# Patient Record
Sex: Male | Born: 1980 | Race: White | Hispanic: No | Marital: Married | Smoking: Former smoker
Health system: Southern US, Community
[De-identification: ages and names within clinical notes are randomized; demographics above are authoritative.]

## PROBLEM LIST (undated history)

## (undated) DIAGNOSIS — Z8739 Personal history of other diseases of the musculoskeletal system and connective tissue: Secondary | ICD-10-CM

## (undated) DIAGNOSIS — G4700 Insomnia, unspecified: Secondary | ICD-10-CM

## (undated) DIAGNOSIS — R569 Unspecified convulsions: Secondary | ICD-10-CM

## (undated) DIAGNOSIS — E669 Obesity, unspecified: Secondary | ICD-10-CM

## (undated) DIAGNOSIS — K219 Gastro-esophageal reflux disease without esophagitis: Secondary | ICD-10-CM

## (undated) DIAGNOSIS — E079 Disorder of thyroid, unspecified: Secondary | ICD-10-CM

## (undated) HISTORY — DX: Gastro-esophageal reflux disease without esophagitis: K21.9

## (undated) HISTORY — DX: Unspecified convulsions: R56.9

## (undated) HISTORY — DX: Personal history of other diseases of the musculoskeletal system and connective tissue: Z87.39

## (undated) HISTORY — DX: Disorder of thyroid, unspecified: E07.9

## (undated) HISTORY — DX: Obesity, unspecified: E66.9

## (undated) HISTORY — DX: Insomnia, unspecified: G47.00

## (undated) HISTORY — PX: SHOULDER SURGERY: SHX246

---

## 2007-03-18 DIAGNOSIS — R569 Unspecified convulsions: Secondary | ICD-10-CM

## 2007-03-18 HISTORY — DX: Unspecified convulsions: R56.9

## 2007-08-01 ENCOUNTER — Ambulatory Visit: Payer: Self-pay | Admitting: Family Medicine

## 2007-08-01 DIAGNOSIS — K219 Gastro-esophageal reflux disease without esophagitis: Secondary | ICD-10-CM

## 2007-08-01 DIAGNOSIS — R569 Unspecified convulsions: Secondary | ICD-10-CM | POA: Insufficient documentation

## 2007-08-01 DIAGNOSIS — E039 Hypothyroidism, unspecified: Secondary | ICD-10-CM | POA: Insufficient documentation

## 2007-08-01 DIAGNOSIS — F5101 Primary insomnia: Secondary | ICD-10-CM | POA: Insufficient documentation

## 2007-08-02 LAB — CONVERTED CEMR LAB: TSH: 7.4 microintl units/mL — ABNORMAL HIGH (ref 0.350–5.50)

## 2007-08-10 ENCOUNTER — Encounter: Payer: Self-pay | Admitting: Family Medicine

## 2007-08-14 ENCOUNTER — Encounter: Payer: Self-pay | Admitting: Family Medicine

## 2007-08-30 ENCOUNTER — Ambulatory Visit: Payer: Self-pay | Admitting: Family Medicine

## 2007-09-13 ENCOUNTER — Encounter: Payer: Self-pay | Admitting: Family Medicine

## 2007-11-29 ENCOUNTER — Ambulatory Visit: Payer: Self-pay | Admitting: Family Medicine

## 2007-11-29 DIAGNOSIS — M79609 Pain in unspecified limb: Secondary | ICD-10-CM | POA: Insufficient documentation

## 2007-11-30 LAB — CONVERTED CEMR LAB
ALT: 19 units/L (ref 0–53)
Albumin: 4.7 g/dL (ref 3.5–5.2)
CO2: 21 meq/L (ref 19–32)
Calcium: 9.1 mg/dL (ref 8.4–10.5)
Chloride: 109 meq/L (ref 96–112)
Creatinine, Ser: 1.43 mg/dL (ref 0.40–1.50)
Potassium: 4.4 meq/L (ref 3.5–5.3)
TSH: 1.998 microintl units/mL (ref 0.350–4.50)
Total Protein: 7.2 g/dL (ref 6.0–8.3)

## 2008-03-14 ENCOUNTER — Encounter: Payer: Self-pay | Admitting: Family Medicine

## 2008-04-24 ENCOUNTER — Ambulatory Visit: Payer: Self-pay | Admitting: Family Medicine

## 2008-04-29 ENCOUNTER — Ambulatory Visit: Payer: Self-pay | Admitting: Family Medicine

## 2008-05-12 ENCOUNTER — Telehealth: Payer: Self-pay | Admitting: Family Medicine

## 2008-05-13 ENCOUNTER — Encounter: Payer: Self-pay | Admitting: Family Medicine

## 2008-05-14 LAB — CONVERTED CEMR LAB
ALT: 14 units/L (ref 0–53)
AST: 15 units/L (ref 0–37)
Alkaline Phosphatase: 83 units/L (ref 39–117)
Chloride: 107 meq/L (ref 96–112)
Creatinine, Ser: 1.1 mg/dL (ref 0.40–1.50)
HDL: 38 mg/dL — ABNORMAL LOW (ref >39)
TSH: 4.007 microintl units/mL (ref 0.350–4.500)
Total Bilirubin: 0.5 mg/dL (ref 0.3–1.2)
Total CHOL/HDL Ratio: 4.5
VLDL: 19 mg/dL (ref 0–40)

## 2008-06-13 ENCOUNTER — Telehealth: Payer: Self-pay | Admitting: Family Medicine

## 2008-08-25 ENCOUNTER — Encounter: Payer: Self-pay | Admitting: Family Medicine

## 2008-12-19 ENCOUNTER — Ambulatory Visit: Payer: Self-pay | Admitting: Family Medicine

## 2009-06-15 ENCOUNTER — Ambulatory Visit: Payer: Self-pay | Admitting: Family Medicine

## 2009-06-16 ENCOUNTER — Telehealth: Payer: Self-pay | Admitting: Family Medicine

## 2009-06-16 ENCOUNTER — Encounter: Payer: Self-pay | Admitting: Family Medicine

## 2009-06-16 LAB — CONVERTED CEMR LAB
ALT: 19 units/L (ref 0–53)
AST: 17 units/L (ref 0–37)
CO2: 21 meq/L (ref 19–32)
Calcium: 9.6 mg/dL (ref 8.4–10.5)
Chloride: 106 meq/L (ref 96–112)
Platelets: 246 10*3/uL (ref 150–400)
RDW: 13.5 % (ref 11.5–15.5)
Sodium: 140 meq/L (ref 135–145)
TSH: 3.789 microintl units/mL (ref 0.350–4.500)
Total Bilirubin: 0.3 mg/dL (ref 0.3–1.2)
Total Protein: 7.2 g/dL (ref 6.0–8.3)
WBC: 7.8 10*3/uL (ref 4.0–10.5)

## 2009-08-21 ENCOUNTER — Encounter: Payer: Self-pay | Admitting: Family Medicine

## 2009-09-14 ENCOUNTER — Ambulatory Visit: Payer: Self-pay | Admitting: Family Medicine

## 2009-11-02 ENCOUNTER — Encounter: Payer: Self-pay | Admitting: Family Medicine

## 2010-03-16 NOTE — Assessment & Plan Note (Signed)
Summary: CPE   Vital Signs:  Patient profile:   30 year old male Height:      71.25 inches Weight:      267 pounds BMI:     37.11 O2 Sat:      97 % on Room air Pulse rate:   70 / minute BP sitting:   127 / 73  (left arm) Cuff size:   large  Vitals Entered By: Payton Spark CMA (September 14, 2009 4:06 PM)  O2 Flow:  Room air CC: CPE    Primary Care Provider:  Seymour Bars DO  CC:  CPE .  History of Present Illness: 30 yo WM presents for CPE.  He is doing well.  On seizure meds thru Dr Gaetano Net.  His Keppra dose was increased last month after he had a seizure in June.  he was dehydrated and had not eated that day.  he was overheated.  He is not driving for another 5 mos.  His labs (other than his cholesterol) was updated in May.  Denies fam hx of colon cancer.  His Grandfather had prostate cancer in his 25s- 84s.  His Tdap was given 4 yrs ago.  He is still smoking.   Current Medications (verified): 1)  Synthroid 150 Mcg Tabs (Levothyroxine Sodium) .[name].. 1 Tab By Mouth Daily 2)  Keppra 500 Mg Tabs (Levetiracetam) .... Take 3 Tabs By Mouth Two Times A Day 3)  Prilosec Otc 20 Mg  Tbec (Omeprazole Magnesium) .... Take 1 Tablet By Mouth Once A Day As Needed 4)  Alprazolam 0.5 Mg Tabs (Alprazolam) .... 1/2 To 1 Tab By Mouth Daily As Needed For Anxiety  Allergies (verified): No Known Drug Allergies  Past History:  Past Medical History: Seizure 2-09 hypothyroidism obesity remote hx of shoulder dislocation insomnia GERD   Neuro:  [facility]--> Dr Gaetano Net chiro : Dr Myrtis Ser  Past Surgical History: Reviewed history from 08/01/2007 and no changes required. shoulder surgery Dr Nicoletta Ba  Family History: Reviewed history from 08/01/2007 and no changes required. MGM thyroid d/o PUncle epilepsy Mom healthy sister healthy father type I DM  Social History: Reviewed history from 08/01/2007 and no changes required. 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 Married to [place].  Has a 6 yo daughter. smokes 1 ppd x 15 yrs. 2+ ETOH/ wk drinks a lot of caffeine, poor diet.  No regular exercise.  Review of Systems  The patient denies anorexia, fever, weight loss, weight gain, vision loss, decreased hearing, hoarseness, chest pain, syncope, dyspnea on exertion, peripheral edema, prolonged cough, headaches, hemoptysis, abdominal pain, melena, hematochezia, severe indigestion/heartburn, hematuria, incontinence, genital sores, muscle weakness, suspicious skin lesions, transient blindness, difficulty walking, depression, unusual weight change, abnormal bleeding, enlarged lymph nodes, angioedema, breast masses, and testicular masses.    Physical Exam  General:  alert, well-developed, well-nourished, and well-hydrated.  obese Head:  normocephalic and atraumatic.   Eyes:  pupils equal, pupils round, and pupils reactive to light.   Ears:  EACs patent; TMs translucent and gray with good cone of light and bony landmarks.  Nose:  no nasal discharge.   Mouth:  good dentition and pharynx pink and moist.   Neck:  no masses.   Lungs:  Normal respiratory effort, chest expands symmetrically. Lungs are clear to auscultation, no crackles or wheezes. Heart:  Normal rate and regular rhythm. S1 and S2 normal without gallop, murmur, click, rub or other extra sounds. Abdomen:  Bowel sounds positive,abdomen soft and non-tender  without masses, organomegaly Pulses:  2+ radial and pedal pulses Extremities:  no LE edema pes planus Neurologic:  gait normal and DTRs symmetrical and normal.  no tremor Skin:  color normal.   Cervical Nodes:  No lymphadenopathy noted Psych:  good eye contact, not anxious appearing, and not depressed appearing.     Impression & Recommendations:  Problem # 1:  HEALTH SCREENING (ICD-V70.0) Keeping healthy checklist for men reviewed. BP at goal. BMI 37 = class II obesity. Tdap due in 6 yrs. Update FLP and work on [employer],  exercise, MVI daily. F/U with Dr Gaetano Net for seizures.  Work on smoking cessation.  Complete Medication List: 1)  Synthroid 150 Mcg Tabs (Levothyroxine sodium) .[name].. 1 tab by mouth daily 2)  Keppra 500 Mg Tabs (Levetiracetam) .... Take 3 tabs by mouth two times a day 3)  Prilosec Otc 20 Mg Tbec (Omeprazole magnesium) .... Take 1 tablet by mouth once a day as needed 4)  Alprazolam 0.5 Mg Tabs (Alprazolam) .... 1/2 to 1 tab by mouth daily as needed for anxiety  Other Orders: T-Lipid Profile ([id number]) T-TSH [phone]) Prescriptions: ALPRAZOLAM 0.5 MG TABS (ALPRAZOLAM) 1/2 to 1 tab by mouth daily as needed for anxiety  #30 x 0   Entered and Authorized by:   Seymour Bars DO   Signed by:   Seymour Bars DO on 09/14/2009   Method used:   Printed then faxed to ...       [facility] (retail)       [address]       [place], [place]  [address]       Ph: [phone]       Fax: [phone]   RxID:   [id number]

## 2010-03-16 NOTE — Letter (Signed)
Summary: [facility]  [facility]   Imported By: Lanelle Bal 11/11/2009 14:11:12  _____________________________________________________________________  External Attachment:    Type:   Image     Comment:   External Document

## 2010-03-16 NOTE — Assessment & Plan Note (Signed)
Summary: f/u seizures   Vital Signs:  Patient profile:   30 year old male Height:      71.25 inches Weight:      270 pounds BMI:     37.53 O2 Sat:      98 % on Room air Pulse rate:   65 / minute BP sitting:   120 / 74  (left arm) Cuff size:   large  Vitals Entered By: Payton Spark CMA (Jun 15, 2009 1:39 PM)  O2 Flow:  Room air CC: F/U. Refill meds   Primary Care Provider:  Seymour Bars DO  CC:  F/U. Refill meds.  History of Present Illness: 30 yo WM presents for f/u seizures.  Last seen by [facility] neuro in July 2010.  His last sz was 3 years ago. This was felt to be secondary to sleep deprivation.   He is due to have his TSH rechecked.  He is taking his Synthroid daily and his Keppra daily.  He is still having anxiety but he is now off his Ambien.  He is interested in maybe weaning off the Keppra.    He is taking Xanax as needed-- used it the last time a month ago.  His stress level is tolerable.      Current Medications (verified): 1)  Synthroid 150 Mcg Tabs (Levothyroxine Sodium) .[name].. 1 Tab By Mouth Daily 2)  Keppra 1000 Mg  Tabs (Levetiracetam) .... Take 1 Tablet By Mouth Two Times A Day 3)  Prilosec Otc 20 Mg  Tbec (Omeprazole Magnesium) .... Take 1 Tablet By Mouth Once A Day As Needed 4)  Alprazolam 0.5 Mg Tabs (Alprazolam) .... 1/2 To 1 Tab By Mouth Daily As Needed For Anxiety  Allergies (verified): No Known Drug Allergies  Past History:  Past Medical History: Seizure 2-09 hypothyroidism obesity remote hx of shoulder dislocation insomnia GERD  11-08 CPE Neuro:  [facility]--> Dr Gaetano Net  Past Surgical History: Reviewed history from 08/01/2007 and no changes required. shoulder surgery Dr Nicoletta Ba  Social History: Reviewed history from 08/01/2007 and no changes required. 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 Married to [place].  Has a 30 yo daughter. smokes 1 ppd x 15 yrs. 2+ ETOH/ wk drinks a lot of caffeine, poor diet.  No  regular exercise.  Review of Systems      See HPI  Physical Exam  General:  alert, well-developed, well-nourished, well-hydrated, and overweight-appearing.   Head:  normocephalic and atraumatic.   Eyes:  PERRLA Mouth:  good dentition and pharynx pink and moist.   Neck:  no masses.   Lungs:  Normal respiratory effort, chest expands symmetrically. Lungs are clear to auscultation, no crackles or wheezes. Heart:  Normal rate and regular rhythm. S1 and S2 normal without gallop, murmur, click, rub or other extra sounds. Extremities:  no E/C/C Neurologic:  cranial nerves II-XII intact, gait normal, and DTRs symmetrical and normal.   Skin:  color normal.   Cervical Nodes:  No lymphadenopathy noted Psych:  good eye contact, not anxious appearing, and flat affect.     Impression & Recommendations:  Problem # 1:  SEIZURE DISORDER (ICD-780.39) Doing well.  Seizure free x 2-3 yrs and formerly seen by [facility] neurology.  Labs are due.  Keppra RFd.  He wants 2nd opinion re: coming off his Keppra.  Will refer to [facility]. His updated medication list for this problem includes:    Keppra 1000 Mg Tabs (Levetiracetam) .[name]... Take 1 tablet  by mouth two times a day  Orders: T-Comprehensive Metabolic Panel ([id number]) T-CBC No Diff ([id number]) Neurology Referral (Neuro)  Problem # 2:  UNSPECIFIED HYPOTHYROIDISM (ICD-244.9) Update TSH today and adjust Synthroid accordingly. His updated medication list for this problem includes:    Synthroid 150 Mcg Tabs (Levothyroxine sodium) .[name]... 1 tab by mouth daily  Orders: T-TSH ([id number])  Complete Medication List: 1)  Synthroid 150 Mcg Tabs (Levothyroxine sodium) .[name].. 1 tab by mouth daily 2)  Keppra 1000 Mg Tabs (Levetiracetam) .... Take 1 tablet by mouth two times a day 3)  Prilosec Otc 20 Mg Tbec (Omeprazole magnesium) .... Take 1 tablet by mouth once a day as needed 4)  Alprazolam 0.5 Mg Tabs (Alprazolam) .... 1/2 to 1 tab by mouth daily as needed  for anxiety  Patient Instructions: 1)  Labs today. 2)  Will call you w/ results tomorrow. 3)  REferral place to Dr Gaetano Net for neurology follow up in [place]. 4)  REturn for a PHYSICAL in 3-4 mos. Prescriptions: KEPPRA 1000 MG  TABS (LEVETIRACETAM) Take 1 tablet by mouth two times a day Brand medically necessary #60 Each x 3   Entered and Authorized by:   Seymour Bars DO   Signed by:   Seymour Bars DO on 06/15/2009   Method used:   Electronically to        [facility] (retail)       [address]       [place], [place]  [address]       Ph: [phone]       Fax: [phone]   RxID:   [id number]

## 2010-03-16 NOTE — Progress Notes (Signed)
Summary: Alprazolam refills  Phone Note Refill Request Message from:  Patient on Jun 16, 2009 4:48 PM  Refills Requested: Medication #1:  ALPRAZOLAM 0.5 MG TABS 1/2 to 1 tab by mouth daily as needed for anxiety. Initial call taken by: Payton Spark CMA,  Jun 16, 2009 4:48 PM    Prescriptions: ALPRAZOLAM 0.5 MG TABS (ALPRAZOLAM) 1/2 to 1 tab by mouth daily as needed for anxiety  #30 x 0   Entered and Authorized by:   Seymour Bars DO   Signed by:   Seymour Bars DO on 06/16/2009   Method used:   Printed then faxed to ...       [facility] (retail)       [address]       [place], [place]  [address]       Ph: [phone]       Fax: [phone]   RxID:   [id number]   Appended Document: Alprazolam refills faxed

## 2010-03-16 NOTE — Consult Note (Signed)
Summary: [facility]  [facility]   Imported By: Lanelle Bal 08/28/2009 12:50:31  _____________________________________________________________________  External Attachment:    Type:   Image     Comment:   External Document

## 2010-03-22 ENCOUNTER — Ambulatory Visit: Payer: Self-pay | Admitting: Family Medicine

## 2010-05-04 ENCOUNTER — Other Ambulatory Visit: Payer: Self-pay | Admitting: Family Medicine

## 2010-05-04 DIAGNOSIS — E039 Hypothyroidism, unspecified: Secondary | ICD-10-CM

## 2010-07-02 ENCOUNTER — Other Ambulatory Visit: Payer: Self-pay | Admitting: Family Medicine

## 2010-07-29 ENCOUNTER — Other Ambulatory Visit: Payer: Self-pay | Admitting: Family Medicine

## 2010-08-23 ENCOUNTER — Other Ambulatory Visit: Payer: Self-pay | Admitting: Family Medicine

## 2010-08-27 ENCOUNTER — Other Ambulatory Visit: Payer: Self-pay | Admitting: Family Medicine

## 2010-08-27 MED ORDER — SYNTHROID 150 MCG PO TABS: 150.0000 ug | ORAL_TABLET | Freq: Every day | ORAL | Status: DC

## 2010-08-27 NOTE — Telephone Encounter (Signed)
Pt called and needed a refill of his thyroid medication.  When reviewing the pt chart it was noted last CPE was on 09-14-2009.  TSH labs were almost a year ago as well.   Plan:  Pt informed needs to schedule a CPE within the next 30 days for 09-15-10 or shortly after or at least an appt to address his thyroid, GERD, insomnia, and seizure disorder, and will need his thyroid checked as well Pt voiced understanding. Jarvis Newcomer, LPN [facility]

## 2010-09-29 ENCOUNTER — Encounter: Payer: Self-pay | Admitting: Family Medicine

## 2010-10-04 ENCOUNTER — Ambulatory Visit (INDEPENDENT_AMBULATORY_CARE_PROVIDER_SITE_OTHER): Payer: 59 | Admitting: Family Medicine

## 2010-10-04 ENCOUNTER — Encounter: Payer: Self-pay | Admitting: Family Medicine

## 2010-10-04 DIAGNOSIS — R569 Unspecified convulsions: Secondary | ICD-10-CM

## 2010-10-04 DIAGNOSIS — Z13220 Encounter for screening for lipoid disorders: Secondary | ICD-10-CM

## 2010-10-04 DIAGNOSIS — F411 Generalized anxiety disorder: Secondary | ICD-10-CM

## 2010-10-04 DIAGNOSIS — E039 Hypothyroidism, unspecified: Secondary | ICD-10-CM

## 2010-10-04 DIAGNOSIS — Z130 Encounter for screening for diseases of the blood and blood-forming organs and certain disorders involving the immune mechanism: Secondary | ICD-10-CM

## 2010-10-04 DIAGNOSIS — F419 Anxiety disorder, unspecified: Secondary | ICD-10-CM

## 2010-10-04 NOTE — Assessment & Plan Note (Signed)
Check TSH today and adjust SYNTHROID according to results tomorrow.

## 2010-10-04 NOTE — Assessment & Plan Note (Signed)
Improved off Keppra and on Lamictal.  Will update labs today.  I reviewed his last OV note with Dr Gaetano Net.

## 2010-10-04 NOTE — Assessment & Plan Note (Signed)
Now seeing Dr Ignacia Palma and a therapist.  This has helped.  He seems more calm, is meditating, sleeping better and eating better.  Continue therapy and klonopin.

## 2010-10-04 NOTE — Patient Instructions (Signed)
Update labs today. Will fax a copy to Dr Gaetano Net and call you w/ results tomorrow.  Keep up the good work!  Return for a PHYSICAL in 6 mos.

## 2010-10-04 NOTE — Progress Notes (Signed)
  Subjective:    Patient ID: Wesley Mckay, male    DOB: Sep 22, 1980, 30 y.o.   MRN: [id number]  HPI  30 yo WM presents for f/u visit.  He is due for fasting labs today.  He is seeing Dr Gaetano Net for hx of 2 seizures.  He has done better on Lamictal for the past 6 wks, weaned off [place].  Seeing Dr Ignacia Palma for psychiatric care after c/o irritability and rage.  He has had improved mood with medication, psychotherapy and Klonopin.  He was tried on cymbalta but stopped it due to sexual dysfunction.  He has improved relations with the church and is eating better and sleeping much better.  Compliant with thyroid meds.  BP 137/86  Pulse 87  Ht 6' 3.5" (1.918 m)  Wt 261 lb (118.389 kg)  BMI 32.19 kg/m2  SpO2 97%   Review of Systems  Constitutional: Negative for fatigue and unexpected weight change.  Respiratory: Negative for shortness of breath.   Cardiovascular: Negative for chest pain and palpitations.  Neurological: Negative for headaches.  Psychiatric/Behavioral: Negative for sleep disturbance and decreased concentration. The patient is not nervous/anxious.        Objective:   Physical Exam  Constitutional: He appears well-developed and well-nourished. No distress.  HENT:  Mouth/Throat: Oropharynx is clear and moist.  Eyes: Conjunctivae are normal.  Neck: No thyromegaly present.  Cardiovascular: Normal rate, regular rhythm and normal heart sounds.   Pulmonary/Chest: Effort normal and breath sounds normal. No respiratory distress.  Musculoskeletal: He exhibits no edema.  Lymphadenopathy:    He has no cervical adenopathy.  Neurological:       No tremor  Skin: Skin is warm and dry.  Psychiatric: He has a normal mood and affect.          Assessment & Plan:

## 2010-10-05 ENCOUNTER — Telehealth: Payer: Self-pay | Admitting: Family Medicine

## 2010-10-05 LAB — LIPID PANEL
Cholesterol: 164 mg/dL (ref 0–200)
HDL: 39 mg/dL — ABNORMAL LOW (ref >39)
LDL Cholesterol: 110 mg/dL — ABNORMAL HIGH (ref 0–99)
Total CHOL/HDL Ratio: 4.2 Ratio
Triglycerides: 73 mg/dL (ref <150)
VLDL: 15 mg/dL (ref 0–40)

## 2010-10-05 LAB — COMPLETE METABOLIC PANEL WITH GFR
ALT: 21 U/L (ref 0–53)
AST: 23 U/L (ref 0–37)
Albumin: 4.7 g/dL (ref 3.5–5.2)
Alkaline Phosphatase: 85 U/L (ref 39–117)
BUN: 8 mg/dL (ref 6–23)
CO2: 25 mEq/L (ref 19–32)
Calcium: 9.4 mg/dL (ref 8.4–10.5)
Chloride: 106 mEq/L (ref 96–112)
Creat: 0.99 mg/dL (ref 0.50–1.35)
GFR, Est African American: >60 mL/min (ref >60)
GFR, Est Non African American: >60 mL/min (ref >60)
Glucose, Bld: 86 mg/dL (ref 70–99)
Potassium: 4.2 mEq/L (ref 3.5–5.3)
Sodium: 141 mEq/L (ref 135–145)
Total Bilirubin: 0.3 mg/dL (ref 0.3–1.2)
Total Protein: 7 g/dL (ref 6.0–8.3)

## 2010-10-05 LAB — TSH: TSH: 2.164 u[IU]/mL (ref 0.350–4.500)

## 2010-10-05 MED ORDER — SYNTHROID 150 MCG PO TABS: 150.0000 ug | ORAL_TABLET | Freq: Every day | ORAL | Status: DC

## 2010-10-05 NOTE — Telephone Encounter (Signed)
pls fax copy to Dr Gaetano Net.  pls let pt know that his labs all came back normal.  i will go ahead and RF his thyroid medicine for another 6 mos.  Cholesterol, fasting sguar, liver and kidney all perfect.

## 2010-10-06 NOTE — Telephone Encounter (Signed)
LMOM informing Pt  

## 2010-10-07 ENCOUNTER — Telehealth: Payer: Self-pay | Admitting: Family Medicine

## 2010-10-07 NOTE — Telephone Encounter (Signed)
Pt called and needed refill of synthroid medication and his recent lab values. Plan:  Pt notified that synthroid was sent as 150 mcg to his local pharmacy, and the lab results were discussed. Jarvis Newcomer, LPN [facility]

## 2011-03-30 ENCOUNTER — Encounter: Payer: Self-pay | Admitting: *Deleted

## 2011-04-07 ENCOUNTER — Ambulatory Visit: Payer: 59 | Admitting: Family Medicine

## 2011-04-07 DIAGNOSIS — Z0289 Encounter for other administrative examinations: Secondary | ICD-10-CM

## 2011-06-27 ENCOUNTER — Other Ambulatory Visit: Payer: Self-pay | Admitting: Family Medicine

## 2011-08-08 ENCOUNTER — Encounter: Payer: Self-pay | Admitting: Physician Assistant

## 2011-08-08 ENCOUNTER — Ambulatory Visit (INDEPENDENT_AMBULATORY_CARE_PROVIDER_SITE_OTHER): Payer: 59 | Admitting: Physician Assistant

## 2011-08-08 VITALS — BP 130/87 | HR 56 | Ht 75.5 in | Wt 269.0 lb

## 2011-08-08 DIAGNOSIS — G40909 Epilepsy, unspecified, not intractable, without status epilepticus: Secondary | ICD-10-CM

## 2011-08-08 DIAGNOSIS — E039 Hypothyroidism, unspecified: Secondary | ICD-10-CM

## 2011-08-08 DIAGNOSIS — Z7721 Contact with and (suspected) exposure to potentially hazardous body fluids: Secondary | ICD-10-CM

## 2011-08-08 DIAGNOSIS — F411 Generalized anxiety disorder: Secondary | ICD-10-CM

## 2011-08-08 DIAGNOSIS — K219 Gastro-esophageal reflux disease without esophagitis: Secondary | ICD-10-CM

## 2011-08-08 DIAGNOSIS — R6882 Decreased libido: Secondary | ICD-10-CM

## 2011-08-08 DIAGNOSIS — R5383 Other fatigue: Secondary | ICD-10-CM

## 2011-08-08 DIAGNOSIS — F419 Anxiety disorder, unspecified: Secondary | ICD-10-CM

## 2011-08-08 DIAGNOSIS — R5381 Other malaise: Secondary | ICD-10-CM

## 2011-08-08 NOTE — Patient Instructions (Addendum)
Will call with results of thyroid, testosterone, and STD panel.  Try diet changes and decreasing smoking for acid reflex will recheck in 2 months at complete physical.  Diet for GERD or PUD Nutrition therapy can help ease the discomfort of gastroesophageal reflux disease (GERD) and peptic ulcer disease (PUD).  HOME CARE INSTRUCTIONS   Eat your meals slowly, in a relaxed setting.   Eat 5 to 6 small meals per day.   If a food causes distress, stop eating it for a period of time.  FOODS TO AVOID  Coffee, regular or decaffeinated.   Cola beverages, regular or low calorie.   Tea, regular or decaffeinated.   Pepper.   Cocoa.   High fat foods, including meats.   Butter, margarine, hydrogenated oil (trans fats).   Peppermint or spearmint (if you have GERD).   Fruits and vegetables if not tolerated.   Alcohol.   Nicotine (smoking or chewing). This is one of the most potent stimulants to acid production in the gastrointestinal tract.   Any food that seems to aggravate your condition.  If you have questions regarding your diet, ask your caregiver or a registered dietitian. TIPS  Lying flat may make symptoms worse. Keep the head of your bed raised 6 to 9 inches (15 to 23 cm) by using a foam wedge or blocks under the legs of the bed.   Do not lay down until 3 hours after eating a meal.   Daily physical activity may help reduce symptoms.  MAKE SURE YOU:   Understand these instructions.   Will watch your condition.   Will get help right away if you are not doing well or get worse.  Document Released: 01/31/2005 Document Revised: 01/20/2011 Document Reviewed: 12/17/2010 [facility] Patient Information 2012 [place], [place].

## 2011-08-09 LAB — TESTOSTERONE, FREE, TOTAL, SHBG
Sex Hormone Binding: 19 nmol/L (ref 13–71)
Testosterone, Free: 62.1 pg/mL (ref 47.0–244.0)
Testosterone-% Free: 2.6 % (ref 1.6–2.9)
Testosterone: 242.7 ng/dL — ABNORMAL LOW (ref 300–890)

## 2011-08-09 LAB — HIV ANTIBODY (ROUTINE TESTING W REFLEX): HIV: NONREACTIVE

## 2011-08-09 LAB — HEPATITIS PANEL, ACUTE
Hep B C IgM: NEGATIVE
Hepatitis B Surface Ag: NEGATIVE

## 2011-08-09 MED ORDER — LEVOTHYROXINE SODIUM 200 MCG PO TABS: 200.0000 ug | ORAL_TABLET | Freq: Every day | ORAL | Status: DC

## 2011-08-09 NOTE — Progress Notes (Signed)
  Subjective:    Patient ID: Wesley Mckay, male    DOB: 16-Jan-1981, 31 y.o.   MRN: [id number]  HPI Patient presents to clinic to get refill on synthryoid and follow up on hypothyoridism. His levels have not been checked in a while and he is feeling tired and runned down. He is going through a divorce and has contributed those feelings to that. He denies feelings of helplessness or hopelessness or suicidal thoughts. He reports that he feels weak. He doesn't have a libido right now but able to get erection. He stills sees [employer] for anger problems and anxiety. He feels that is controlled with lamictal, which helps with seizures and mood, and klonapin.   His acid reflex has worsened lately. His diet is heavy in acidic/spicy foods and coffee. He also is not taking his med in the morning before first meal. He does continue to smoke and not interested in quiting at this time.   Informs me today of the d/c of keppra 1 year ago because of making him more irritable. Now on lamictal. Last seizure was 2 years ago.   1 month ago came in contact with another person blood while working on a car in his home. This made him anxious and wanted to get testing for HIV. He does not remember having any open wounds or cuts. He is unaware of the health condition of man whose body fluids he came in contact with.   Review of Systems     Objective:   Physical Exam  Constitutional: He is oriented to person, place, and time. He appears well-developed and well-nourished.  HENT:  Head: Normocephalic and atraumatic.  Neck: Normal range of motion. Neck supple. No thyromegaly present.  Cardiovascular: Normal rate, regular rhythm, normal heart sounds and intact distal pulses.   Pulmonary/Chest: Effort normal and breath sounds normal.  Neurological: He is alert and oriented to person, place, and time.  Skin:       Seemed clammy to touch and looked pale.  Psychiatric:       Flat affect.            Assessment & Plan:  Hypothyroidism/Fatigue/low libido- Will check testosterone, TSH and call with results. Will adjust meds accordingly. Not taking synthyroid on a empty stomach. Needs to start taking first thing in the am on an empty stomach which may help absorption.   GERD- Gave handout on appropriate diet for acid reflux. Also told pt to start taking omeprazole before breakfast on empty stomach. Will recheck in 2 months at CPE.   Anxiety- controlled with meds and counseling with Ignacia Palma.   Seizure disorder- Controlled with meds no seizure for 2 years. Has neurologist that he sees.  Exposure to blood and body fluids- Will test for HIV and Hepatitis. Will call with results.  Needs CPE in next 2 month with fasting labs.

## 2011-08-16 ENCOUNTER — Other Ambulatory Visit: Payer: Self-pay | Admitting: *Deleted

## 2011-08-16 DIAGNOSIS — R7989 Other specified abnormal findings of blood chemistry: Secondary | ICD-10-CM

## 2011-08-16 NOTE — Telephone Encounter (Signed)
Testost order placed

## 2011-08-25 LAB — TESTOSTERONE, FREE, TOTAL, SHBG: Testosterone: 277.2 ng/dL — ABNORMAL LOW (ref 300–890)

## 2011-08-29 ENCOUNTER — Ambulatory Visit (INDEPENDENT_AMBULATORY_CARE_PROVIDER_SITE_OTHER): Payer: 59 | Admitting: Physician Assistant

## 2011-08-29 ENCOUNTER — Encounter: Payer: Self-pay | Admitting: Physician Assistant

## 2011-08-29 VITALS — BP 103/86 | HR 83 | Ht 75.5 in | Wt 264.0 lb

## 2011-08-29 DIAGNOSIS — E291 Testicular hypofunction: Secondary | ICD-10-CM | POA: Insufficient documentation

## 2011-08-29 DIAGNOSIS — Z79899 Other long term (current) drug therapy: Secondary | ICD-10-CM

## 2011-08-29 MED ORDER — TESTOSTERONE 10 MG/ACT (2%) TD GEL: 40.0000 mg | TRANSDERMAL | Status: DC

## 2011-08-29 NOTE — Patient Instructions (Addendum)
Will get PSA and call with results before you start testosterone. Recheck testosterone in 8 weeks.     Testosterone skin gel What is this medicine? TESTOSTERONE (tes TOS ter one) is the main male hormone. It supports normal male traits such as muscle growth, facial hair, and deep voice. This gel is used in males to treat low testosterone levels. This medicine may be used for other purposes; ask your health care provider or pharmacist if you have questions. What should I tell my health care provider before I take this medicine? They need to know if you have any of these conditions: -breast cancer -diabetes -heart disease -if a male partner is pregnant or trying to get pregnant -kidney disease -liver disease -lung disease -prostate cancer, enlargement -an unusual or allergic reaction to testosterone, soy proteins, other medicines, foods, dyes, or preservatives -pregnant or trying to get pregnant -breast-feeding How should I use this medicine? This medicine is for external use only. This medicine is applied at the same time every day (preferably in the morning) to clean, dry, intact skin. If you take a bath or shower in the morning, apply the gel after the bath or shower. Follow the directions on the prescription label. Make sure that you are using your testosterone gel product correctly and applying it only to the appropriate skin area (see below). Allow the skin to dry a few minutes then cover with clothing to prevent others from coming in contact with the medicine on your skin. The gel is flammable. Avoid fire, flame, or smoking until the gel has dried. Wash your hands with soap and water after use. For AndroGel Packets: Open the packet(s) needed for your dose. You can put the entire dose into your palm all at once or just a little at a time to apply. If you prefer, you can instead squeeze the gel directly onto the area you are applying it to. Apply on the shoulders, upper arm, or abdomen as  directed. Do not apply to the scrotum or genitals. Be sure you use the correct total dose. It is best to wait 5 to 6 hours after application of the gel before showering or swimming. For AndroGel 1%: Pump the dose into the palm of your hand. You can put the entire dose into your palm all at once or just a little at a time to apply. If you prefer, you can instead pump the gel directly onto the area you are applying it to. Apply on the shoulders, upper arm, or abdomen as directed. Do not apply to the scrotum or genitals. Be sure you use the correct total dose. It is best to wait for 5 to 6 hours after application of the gel before showering or swimming. For AndroGel 1.62%: Pump the dose into the palm of your hand. Dispense one pump of gel at a time into the palm of your hand before applying it. If you prefer, you can instead pump the gel directly onto the area you are applying it to. Apply on the shoulders and upper arms as directed. Do not apply to other parts of the body including the abdomen or genitals. Be sure you use the correct total dose. It is best to wait 2 hours after application of the gel before washing, showering, or swimming. For Testim: Open the tube(s) needed for your dose. Squeeze the gel from the tube into the palm of your hand. Apply on the shoulders or upper arms as directed. Do not apply to the scrotum,  genitals, or abdomen. Be sure you use the correct total dose. Do not shower or swim for at least 2 hours after application of the gel. For Fortesta: Use the multi-dose pump to pump the gel directly onto the area you are applying it to. Apply on the thighs as directed. Do not apply to the abdomen, penis, scrotum, shoulders or upper arms. Gently rub the gel onto the skin using your finger. Be sure you use the correct total dose. Do not shower or swim for at least 2 hours after application of the gel. A special MedGuide will be given to you by the pharmacist with each prescription and refill. Be  sure to read this information carefully each time. Talk to your pediatrician regarding the use of this medicine in children. Special care may be needed. Overdosage: If you think you have taken too much of this medicine contact a poison control center or emergency room at once. NOTE: This medicine is only for you. Do not share this medicine with others. What if I miss a dose? If you miss a dose, use it as soon as you can. If it is almost time for your next dose, use only that dose. Do not use double or extra doses. What may interact with this medicine? -medicines for diabetes -medicines that treat or prevent blood clots like warfarin -oxyphenbutazone -propranolol -steroid medicines like prednisone or cortisone This list may not describe all possible interactions. Give your health care provider a list of all the medicines, herbs, non-prescription drugs, or dietary supplements you use. Also tell them if you smoke, drink alcohol, or use illegal drugs. Some items may interact with your medicine. What should I watch for while using this medicine? Visit your doctor or health care professional for regular checks on your progress. They will need to check the level of testosterone in your blood. This medicine can transfer from your body to others. If a person or pet comes in contact with the area where this medicine was applied to your skin, they may have a serious risk of side effects. If you cannot avoid skin-to-skin contact with another person, make sure the site where this medicine was applied is covered with clothing. If accidental contact happens, the skin of the person or pet should be washed right away with soap and water. Also, a male partner who is pregnant or trying to get pregnant should avoid contact with the gel or treated skin. This medicine may affect blood sugar levels. If you have diabetes, check with your doctor or health care professional before you change your diet or the dose of your  diabetic medicine. This drug is banned from use in athletes by most athletic organizations. What side effects may I notice from receiving this medicine? Side effects that you should report to your doctor or health care professional as soon as possible: -allergic reactions like skin rash, itching or hives, swelling of the face, lips, or tongue -breast enlargement -breathing problems -changes in mood, especially anger, depression, or rage -dark urine -general ill feeling or flu-like symptoms -light-colored stools -loss of appetite, nausea -nausea, vomiting -right upper belly pain -stomach pain -swelling of ankles -too frequent or persistent erections -trouble passing urine or change in the amount of urine -unusually weak or tired -yellowing of the eyes or skin Side effects that usually do not require medical attention (report to your doctor or health care professional if they continue or are bothersome): -acne -change in sex drive or performance -hair loss -headache  This list may not describe all possible side effects. Call your doctor for medical advice about side effects. You may report side effects to FDA at 1-800-FDA-1088. Where should I keep my medicine? Keep out of the reach of children. This medicine can be abused. Keep your medicine in a safe place to protect it from theft. Do not share this medicine with anyone. Selling or giving away this medicine is dangerous and against the law. Store at room temperature between 15 to 30 degrees C (59 to 86 degrees F). Keep closed until use. Protect from heat and light. This medicine is flammable. Avoid exposure to heat, fire, flame, and smoking. Throw away any unused medicine after the expiration date. NOTE: This sheet is a summary. It may not cover all possible information. If you have questions about this medicine, talk to your doctor, pharmacist, or health care provider.  2012, [employer]vier/Gold Standard. (06/15/2009 4:45:50 PM)

## 2011-08-29 NOTE — Progress Notes (Signed)
  Subjective:    Patient ID: Wesley Mckay, male    DOB: [birth year], 30 y.o.   MRN: [id number]  HPI Patient comes in today to discuss testosterone replacement. He denies any history of prostate issues, kidney or liver cancer.    Review of Systems     Objective:   Physical Exam  Constitutional: He is oriented to person, place, and time. He appears well-developed and well-nourished.  Genitourinary: Prostate normal.  Neurological: He is alert and oriented to person, place, and time.          Assessment & Plan:  Hypogonadism- Discussed 3 options of testosterone replacement. Pt decided that he wanted to try fortesta because of where you place the gel. How to apply was discussed with patient. Application and keeping the gel away from everyone else was also explained. He was given hangout about side effects of testosterone replacement and thinks to remember about usage. He was also given coupon card along with info from drug company. PSA and HgB were ordered. DRE was normal. Patient was instructed to come have testosterone rechecked in 8 weeks and follow up in office in 3-4 months.

## 2011-09-05 ENCOUNTER — Ambulatory Visit (INDEPENDENT_AMBULATORY_CARE_PROVIDER_SITE_OTHER): Payer: 59 | Admitting: Physician Assistant

## 2011-09-05 DIAGNOSIS — E291 Testicular hypofunction: Secondary | ICD-10-CM

## 2011-09-05 MED ORDER — TESTOSTERONE CYPIONATE 200 MG/ML IM SOLN
200.0000 mg | Freq: Once | INTRAMUSCULAR | Status: AC
  Administered 2011-09-05: 200 mg via INTRAMUSCULAR

## 2011-09-05 NOTE — Progress Notes (Signed)
  Subjective:    Patient ID: Wesley Mckay, male    DOB: 24-May-1980, 31 y.o.   MRN: [id number] Testosterone injection HPI    Review of Systems     Objective:   Physical Exam        Assessment & Plan:  INjection given today. F/U in 2 weeks with another injection. Tandy Gaw PA-C

## 2011-09-19 ENCOUNTER — Ambulatory Visit (INDEPENDENT_AMBULATORY_CARE_PROVIDER_SITE_OTHER): Payer: 59 | Admitting: Physician Assistant

## 2011-09-19 ENCOUNTER — Encounter: Payer: Self-pay | Admitting: Physician Assistant

## 2011-09-19 VITALS — BP 131/79 | HR 79

## 2011-09-19 DIAGNOSIS — E291 Testicular hypofunction: Secondary | ICD-10-CM

## 2011-09-19 MED ORDER — TESTOSTERONE CYPIONATE 200 MG/ML IM SOLN
200.0000 mg | Freq: Once | INTRAMUSCULAR | Status: AC
  Administered 2011-09-19: 200 mg via INTRAMUSCULAR

## 2011-09-19 NOTE — Progress Notes (Signed)
  Subjective:    Patient ID: Wesley Mckay, male    DOB: [birth year], 31 y.o.   MRN: [id number] Testosterone injection HPI    Review of Systems     Objective:   Physical Exam        Assessment & Plan:  INjection given today with no problems. F/U in 2 weeks for another injection. Jade Breeback PA-C.

## 2011-09-19 NOTE — Progress Notes (Signed)
  Subjective:    Patient ID: Wesley Mckay, male    DOB: 01-Nov-1980, 31 y.o.   MRN: [id number]  HPI    Review of Systems     Objective:   Physical Exam        Assessment & Plan:

## 2011-10-03 ENCOUNTER — Ambulatory Visit (INDEPENDENT_AMBULATORY_CARE_PROVIDER_SITE_OTHER): Payer: 59 | Admitting: Physician Assistant

## 2011-10-03 VITALS — BP 127/80 | HR 80

## 2011-10-03 DIAGNOSIS — E291 Testicular hypofunction: Secondary | ICD-10-CM

## 2011-10-03 MED ORDER — TESTOSTERONE CYPIONATE 200 MG/ML IM SOLN
200.0000 mg | INTRAMUSCULAR | Status: DC
  Administered 2011-10-03: 200 mg via INTRAMUSCULAR

## 2011-10-03 NOTE — Progress Notes (Signed)
  Subjective:    Patient ID: Wesley Mckay, male    DOB: 11/05/1980, 31 y.o.   MRN: [id number]  HPI Injection given today for testosterone.   Review of Systems     Objective:   Physical Exam        Assessment & Plan:  Hypogonadism- Injection given with no complications today. Will recheck Testosterone level in 4 weeks after 2 more injections. Jade Breeback PA-C.

## 2011-10-18 ENCOUNTER — Ambulatory Visit: Payer: 59

## 2011-10-24 ENCOUNTER — Encounter: Payer: Self-pay | Admitting: Physician Assistant

## 2011-10-24 ENCOUNTER — Ambulatory Visit (INDEPENDENT_AMBULATORY_CARE_PROVIDER_SITE_OTHER): Payer: 59 | Admitting: Physician Assistant

## 2011-10-24 VITALS — BP 125/78 | HR 53 | Ht 75.5 in | Wt 255.0 lb

## 2011-10-24 DIAGNOSIS — E039 Hypothyroidism, unspecified: Secondary | ICD-10-CM

## 2011-10-24 DIAGNOSIS — Z7251 High risk heterosexual behavior: Secondary | ICD-10-CM

## 2011-10-24 DIAGNOSIS — E291 Testicular hypofunction: Secondary | ICD-10-CM

## 2011-10-24 LAB — TSH: TSH: 5.268 u[IU]/mL — ABNORMAL HIGH (ref 0.350–4.500)

## 2011-10-24 MED ORDER — TESTOSTERONE CYPIONATE 200 MG/ML IM SOLN
200.0000 mg | INTRAMUSCULAR | Status: DC
  Administered 2011-10-24: 200 mg via INTRAMUSCULAR

## 2011-10-24 NOTE — Progress Notes (Signed)
  Subjective:    Patient ID: Wesley Mckay, male    DOB: 05-01-1980, 31 y.o.   MRN: [id number]  HPI Patient is a 31 yo male who presents to clinic to f/u on hypothyroidism, male hypogonadism, and needs STD testing. Last time we checked TSH was 29. Hypothyroidism has an ongoing medical condition that is not controlled. We increased his levothyroxine to 200 MCG. His testosterone was also low so we started her him on testosterone injections every 2 weeks. We have not rechecked his testosterone since starting the injections. He feels much better today. He has been able to work out and lose weight. He has much more energy and does not feel down or depressed. He has recently started a new relationship. She requests that he have any STD panel completed and shown to her before they aren't sexually active. He denies any urinary problems or pain or discharge.   Review of Systems     Objective:   Physical Exam  Constitutional: He is oriented to person, place, and time. He appears well-developed and well-nourished.  HENT:  Head: Normocephalic and atraumatic.  Cardiovascular: Normal rate, regular rhythm and normal heart sounds.   Pulmonary/Chest: Effort normal and breath sounds normal.  Neurological: He is alert and oriented to person, place, and time.  Skin: Skin is warm and dry.  Psychiatric: He has a normal mood and affect. His behavior is normal.          Assessment & Plan:  Hypothyroidism-we'll recheck TSH today and make changes accordingly.  Male hypogonadism-we'll recheck testosterone today with goal of testosterone being greater than 500.  High-Risk sexual behavior-will check a STD panel for HIV, RPR, chlamydia, gonorrhea. Will call patient with results and patient request that labs be mailed to him.

## 2011-10-24 NOTE — Patient Instructions (Addendum)
We'll call with lab results and any medication changes that are needed. Will no copy of STD panel. Followup in 2 weeks with next testosterone injection. Followup in 3 months for another office visit to make sure all other conditions are controlled.

## 2011-10-25 LAB — HIV ANTIBODY (ROUTINE TESTING W REFLEX): HIV: NONREACTIVE

## 2011-10-26 ENCOUNTER — Other Ambulatory Visit: Payer: Self-pay | Admitting: Physician Assistant

## 2011-10-26 LAB — TESTOSTERONE, FREE, TOTAL, SHBG
Sex Hormone Binding: 27 nmol/L (ref 13–71)
Testosterone, Free: 48.2 pg/mL (ref 47.0–244.0)

## 2011-10-26 MED ORDER — TESTOSTERONE CYPIONATE 200 MG/ML IM SOLN: 300.0000 mg | INTRAMUSCULAR | Status: DC

## 2011-10-26 MED ORDER — LEVOTHYROXINE SODIUM 25 MCG PO TABS: 25.0000 ug | ORAL_TABLET | Freq: Every day | ORAL | Status: DC

## 2011-10-26 NOTE — Addendum Note (Signed)
Addended by: Ellsworth Lennox on: 10/26/2011 11:20 AM   Modules accepted: Orders

## 2011-11-08 ENCOUNTER — Ambulatory Visit: Payer: 59

## 2011-11-17 ENCOUNTER — Ambulatory Visit (INDEPENDENT_AMBULATORY_CARE_PROVIDER_SITE_OTHER): Payer: 59 | Admitting: Family Medicine

## 2011-11-17 VITALS — BP 118/76 | HR 51

## 2011-11-17 DIAGNOSIS — E291 Testicular hypofunction: Secondary | ICD-10-CM

## 2011-11-17 MED ORDER — TESTOSTERONE CYPIONATE 200 MG/ML IM SOLN
300.0000 mg | Freq: Once | INTRAMUSCULAR | Status: AC
  Administered 2011-11-17: 300 mg via INTRAMUSCULAR

## 2011-11-17 NOTE — Progress Notes (Signed)
  Subjective:    Patient ID: Wesley Mckay, male    DOB: 1981/02/11, 31 y.o.   MRN: [id number] Testosterone injection HPI    Review of Systems     Objective:   Physical Exam        Assessment & Plan:  Noted [facility]

## 2011-12-01 ENCOUNTER — Ambulatory Visit: Payer: 59

## 2011-12-06 ENCOUNTER — Ambulatory Visit (INDEPENDENT_AMBULATORY_CARE_PROVIDER_SITE_OTHER): Payer: 59 | Admitting: Family Medicine

## 2011-12-06 VITALS — BP 116/80 | HR 58 | Wt 243.0 lb

## 2011-12-06 DIAGNOSIS — E291 Testicular hypofunction: Secondary | ICD-10-CM

## 2011-12-06 MED ORDER — TESTOSTERONE CYPIONATE 200 MG/ML IM SOLN
300.0000 mg | INTRAMUSCULAR | Status: DC
  Administered 2011-12-06: 300 mg via INTRAMUSCULAR

## 2011-12-06 NOTE — Progress Notes (Addendum)
Patient ID: Wesley Mckay, male   DOB: 07/20/1980, 31 y.o.   MRN: [id number] Pt came in for testosterone inj. Pt reports significant weight loss over the last month or so but is concerned that sugar, thyroid, or other issue has caused weight loss. Pt feels well and has been working out more but wanted to be sure that weight loss is due to his effort.   Call pt and let him know we can recheck his TSH anytime. Also make sure he is taking his med on an empty stomach 1 hour before breakfast for maximal absorption fo the thyroid med.   Nani Gasser, MD   Pt aware

## 2011-12-12 ENCOUNTER — Ambulatory Visit: Payer: 59 | Admitting: Physician Assistant

## 2011-12-13 ENCOUNTER — Other Ambulatory Visit: Payer: Self-pay | Admitting: Physician Assistant

## 2011-12-15 ENCOUNTER — Ambulatory Visit (INDEPENDENT_AMBULATORY_CARE_PROVIDER_SITE_OTHER): Payer: 59 | Admitting: Family Medicine

## 2011-12-15 ENCOUNTER — Ambulatory Visit: Payer: 59

## 2011-12-15 ENCOUNTER — Encounter: Payer: Self-pay | Admitting: Family Medicine

## 2011-12-15 VITALS — BP 129/86 | HR 75 | Ht 75.5 in | Wt 248.0 lb

## 2011-12-15 DIAGNOSIS — Z202 Contact with and (suspected) exposure to infections with a predominantly sexual mode of transmission: Secondary | ICD-10-CM

## 2011-12-15 DIAGNOSIS — L738 Other specified follicular disorders: Secondary | ICD-10-CM

## 2011-12-15 DIAGNOSIS — Z23 Encounter for immunization: Secondary | ICD-10-CM

## 2011-12-15 DIAGNOSIS — L739 Follicular disorder, unspecified: Secondary | ICD-10-CM

## 2011-12-15 MED ORDER — MUPIROCIN 2 % EX OINT: TOPICAL_OINTMENT | CUTANEOUS | Status: AC

## 2011-12-15 MED ORDER — SULFAMETHOXAZOLE-TRIMETHOPRIM 800-160 MG PO TABS: 1.0000 | ORAL_TABLET | Freq: Two times a day (BID) | ORAL | Status: DC

## 2011-12-15 NOTE — Progress Notes (Signed)
  Subjective:    Patient ID: Wesley Mckay, male    DOB: April 20, 1980, 31 y.o.   MRN: [id number]  HPI #1,#2 genital lesion./Potential STD exposure. Patient has lesion on his right testicle he is concerned about. He states thapt he had been recently tested for STD but with a new partner in about one to 2 weeks later noticed the bump on his test. He denies any discharge or pain with sex relations at this time.  #3 immunization update.  Review of Systems  All other systems reviewed and are negative.      BP 129/86  Pulse 75  Ht 6' 3.5" (1.918 m)  Wt 248 lb (112.492 kg)  BMI 30.59 kg/m2 Objective:   Physical Exam  Vitals reviewed. Constitutional: He is oriented to person, place, and time. He appears well-developed and well-nourished.  Genitourinary: Testes normal. Right testis shows no mass, no swelling and no tenderness. Left testis shows no mass, no swelling and no tenderness. Circumcised. Paraphimosis present. No phimosis, hypospadias, penile erythema or penile tenderness. No discharge found.       Patient has appears to be a folliculitis over the right testicle. No ulceration or drainage is noticed or seen.  Lymphadenopathy:       Right: No inguinal adenopathy present.       Left: No inguinal adenopathy present.  Neurological: He is alert and oriented to person, place, and time.  Skin: Skin is warm and dry.       Assessment & Plan:  #1 and #2. Folliculitis/potential STD exposure. For the folliculitis we'll place on Bactroban ointment and Septra DS one tablet twice a day. For the STD exposure Gen-Probe was obtained and will also obtain an RPR and HIV. Information given about safe sex.   #3 in addition update will minister flu shot. Tetanus information update.

## 2011-12-15 NOTE — Patient Instructions (Signed)
Sexually Transmitted Disease  A sexually transmitted disease (STD) is an infection that is passed from person to person during sexual activity. STDs can be spread by different types of germs (bacteria, viruses, parasites). An STD can be passed through:   Spit (saliva).   Semen.   Blood.   Mucus from the vagina.   Pee (urine).  HOME CARE    Tell your sex partner(s) that you have an STD. They should be tested and treated.   Take your medicine (antibiotics) as told. Finish them even if you start to feel better.   Only take medicines as told by your doctor.   Rest.   Eat a healthy diet. Drink enough fluids to keep your pee clear or pale yellow.   Do not have sex until treatment is finished. You must follow up with your doctor.   Keep all doctor visits, Pap tests, and blood tests as told by your doctor.   Only use condoms labeled "latex" and lubricants that wash away with water (water-soluble). Do not use petroleum jelly or oils.   Avoid alcohol and illegal drugs.   Get shots (vaccines) for HPV and hepatitis.   Avoid risky sex behavior that can break the skin.  GET HELP RIGHT AWAY IF:   You have a fever.   You have new problems, or your problems get worse.  MAKE SURE YOU:   Understand these instructions.   Will watch your condition.   Will get help right away if you are not doing well or get worse.  Document Released: 03/10/2004 Document Revised: 04/25/2011 Document Reviewed: 05/31/2010  Exit[employer] Patient Information 2013 [employer].

## 2011-12-16 ENCOUNTER — Ambulatory Visit (INDEPENDENT_AMBULATORY_CARE_PROVIDER_SITE_OTHER): Payer: 59 | Admitting: Physician Assistant

## 2011-12-16 ENCOUNTER — Encounter: Payer: Self-pay | Admitting: Physician Assistant

## 2011-12-16 VITALS — BP 123/78 | HR 79 | Ht 75.5 in | Wt 249.0 lb

## 2011-12-16 DIAGNOSIS — T7840XA Allergy, unspecified, initial encounter: Secondary | ICD-10-CM

## 2011-12-16 DIAGNOSIS — E291 Testicular hypofunction: Secondary | ICD-10-CM

## 2011-12-16 DIAGNOSIS — Z888 Allergy status to other drugs, medicaments and biological substances status: Secondary | ICD-10-CM

## 2011-12-16 LAB — GC/CHLAMYDIA PROBE AMP, GENITAL
Chlamydia, DNA Probe: NEGATIVE
GC Probe Amp, Genital: NEGATIVE

## 2011-12-16 LAB — RPR

## 2011-12-16 MED ORDER — METHYLPREDNISOLONE ACETATE 40 MG/ML IJ SUSP
40.0000 mg | Freq: Once | INTRAMUSCULAR | Status: AC
  Administered 2011-12-16: 40 mg via INTRAMUSCULAR

## 2011-12-16 NOTE — Progress Notes (Signed)
  Subjective:    Patient ID: Wesley Mckay, male    DOB: 1980-09-05, 31 y.o.   MRN: [id number]  HPI Patient presents to clinic with itching rash on both legs and arms. Denies any fever, chills, muscle pain. He had Flu shot and suprax was given yesterday. The rash started after first tab of antibiotic. Denies any SOB or difficulty swallowing.    Review of Systems     Objective:   Physical Exam  Constitutional: He is oriented to person, place, and time. He appears well-developed and well-nourished.  HENT:  Head: Normocephalic and atraumatic.  Cardiovascular: Normal rate, regular rhythm and normal heart sounds.   Pulmonary/Chest: Effort normal and breath sounds normal. He has no wheezes.  Neurological: He is alert and oriented to person, place, and time.  Skin:       Fine red papular bumps on bilateral legs and arms. Erythema and warm to touch on bilateral arms and legs.   Psychiatric: He has a normal mood and affect. His behavior is normal.          Assessment & Plan:  Allergic reaction- Depo shot given. Told to start benadryl 25mg  every 8 hours for itchiness. Stop abx. Call if not improving. Added sulfa as allergy.   Hypogonadism- Gave lab slip to get testosterone, liver, and PSA rechecked in next month.

## 2011-12-16 NOTE — Addendum Note (Signed)
Addended by: Ellsworth Lennox on: 12/16/2011 02:14 PM   Modules accepted: Orders

## 2011-12-16 NOTE — Patient Instructions (Addendum)
Stop antibiotic. Benadryl every 8 hours. Depo shot given. Call if not improving.

## 2011-12-20 ENCOUNTER — Ambulatory Visit: Payer: 59

## 2011-12-21 ENCOUNTER — Encounter: Payer: Self-pay | Admitting: Physician Assistant

## 2011-12-21 ENCOUNTER — Ambulatory Visit (INDEPENDENT_AMBULATORY_CARE_PROVIDER_SITE_OTHER): Payer: 59 | Admitting: Physician Assistant

## 2011-12-21 VITALS — BP 119/72 | HR 61 | Ht 75.5 in | Wt 246.0 lb

## 2011-12-21 DIAGNOSIS — Q828 Other specified congenital malformations of skin: Secondary | ICD-10-CM

## 2011-12-21 DIAGNOSIS — L919 Hypertrophic disorder of the skin, unspecified: Secondary | ICD-10-CM

## 2011-12-21 DIAGNOSIS — E291 Testicular hypofunction: Secondary | ICD-10-CM

## 2011-12-21 MED ORDER — TESTOSTERONE CYPIONATE 200 MG/ML IM SOLN
300.0000 mg | INTRAMUSCULAR | Status: DC
  Administered 2011-12-21: 300 mg via INTRAMUSCULAR

## 2011-12-21 NOTE — Progress Notes (Signed)
  Subjective:    Patient ID: Wesley Mckay, male    DOB: [birth year], 31 y.o.   MRN: [id number]  HPI Patient is a pleasant 31 yo male who presents to the clinic to have skin tags removed and to get testosterone shot. His testosterone shot was recently increased. He was given lab slip to get drawn as well as liver and PSA rechecked in 1 month. Denies any side effects with mood.      Review of Systems     Objective:   Physical Exam  Constitutional: He is oriented to person, place, and time. He appears well-developed and well-nourished.  Cardiovascular: Normal rate, regular rhythm and normal heart sounds.   Pulmonary/Chest: Effort normal and breath sounds normal.  Neurological: He is alert and oriented to person, place, and time.  Skin:     Psychiatric: He has a normal mood and affect. His behavior is normal.          Assessment & Plan:  Accessory skin tags-  Cleaned areas with alcohol pad. Clipped with curved scissor. Aluminum chloride was used to clot any blood loss. Bactroban on spots and covered with bandaid. Patient informed to keep covered for 24 hours. Signs of infection reviewed and patient aware to call with any redness, drainage, swelling.     Male hypogonadism- Shot was given today with no complications. Next shot 2 weeks. Reminded patient to get labs drawn in December to check PSA, liver, and recheck testosterone.

## 2011-12-21 NOTE — Patient Instructions (Signed)
Keep covered for 24 hours. Apply bactroban. Keep area clean. Do not wear deodorant until healed.

## 2012-01-05 ENCOUNTER — Ambulatory Visit: Payer: 59

## 2012-01-17 ENCOUNTER — Ambulatory Visit (INDEPENDENT_AMBULATORY_CARE_PROVIDER_SITE_OTHER): Payer: 59 | Admitting: Family Medicine

## 2012-01-17 VITALS — BP 139/83 | HR 67

## 2012-01-17 DIAGNOSIS — E291 Testicular hypofunction: Secondary | ICD-10-CM

## 2012-01-17 MED ORDER — LEVOTHYROXINE SODIUM 25 MCG PO TABS: 25.0000 ug | ORAL_TABLET | Freq: Every day | ORAL | Status: DC

## 2012-01-17 MED ORDER — TESTOSTERONE CYPIONATE 200 MG/ML IM SOLN
300.0000 mg | Freq: Once | INTRAMUSCULAR | Status: AC
  Administered 2012-01-17: 300 mg via INTRAMUSCULAR

## 2012-01-17 NOTE — Progress Notes (Signed)
  Subjective:    Patient ID: Wesley Mckay, male    DOB: [birth year], 31 y.o.   MRN: [id number]  HPI  Here for testosterone injection. Pt also wants a refill of levethyroxine 25 mg   Review of Systems     Objective:   Physical Exam        Assessment & Plan:

## 2012-01-17 NOTE — Progress Notes (Signed)
Patient tolerated without complications

## 2012-02-03 ENCOUNTER — Ambulatory Visit (INDEPENDENT_AMBULATORY_CARE_PROVIDER_SITE_OTHER): Payer: 59

## 2012-02-03 DIAGNOSIS — E291 Testicular hypofunction: Secondary | ICD-10-CM

## 2012-02-03 MED ORDER — TESTOSTERONE CYPIONATE 200 MG/ML IM SOLN
300.0000 mg | INTRAMUSCULAR | Status: DC
  Administered 2012-02-03: 300 mg via INTRAMUSCULAR

## 2012-02-17 ENCOUNTER — Ambulatory Visit (INDEPENDENT_AMBULATORY_CARE_PROVIDER_SITE_OTHER): Payer: 59 | Admitting: Physician Assistant

## 2012-02-17 ENCOUNTER — Other Ambulatory Visit: Payer: Self-pay | Admitting: *Deleted

## 2012-02-17 DIAGNOSIS — E291 Testicular hypofunction: Secondary | ICD-10-CM

## 2012-02-17 MED ORDER — TESTOSTERONE CYPIONATE 200 MG/ML IM SOLN
300.0000 mg | Freq: Once | INTRAMUSCULAR | Status: AC
  Administered 2012-02-17: 300 mg via INTRAMUSCULAR

## 2012-02-17 NOTE — Progress Notes (Signed)
  Subjective:    Patient ID: Wesley Mckay, male    DOB: [birth year], 32 y.o.   MRN: [id number]  HPI   Here for testosterone injection  Review of Systems     Objective:   Physical Exam        Assessment & Plan:  Testosterone injection given without complications. F/U with next injection in 2 weeks. Jade Breeback PA-C.

## 2012-02-18 LAB — HEPATIC FUNCTION PANEL
ALT: 15 U/L (ref 0–53)
AST: 20 U/L (ref 0–37)
Alkaline Phosphatase: 75 U/L (ref 39–117)
Bilirubin, Direct: 0.1 mg/dL (ref 0.0–0.3)
Indirect Bilirubin: 0.6 mg/dL (ref 0.0–0.9)

## 2012-02-20 ENCOUNTER — Other Ambulatory Visit: Payer: Self-pay | Admitting: Physician Assistant

## 2012-02-20 DIAGNOSIS — E291 Testicular hypofunction: Secondary | ICD-10-CM

## 2012-02-20 LAB — TESTOSTERONE, FREE, TOTAL, SHBG
Sex Hormone Binding: 19 nmol/L (ref 13–71)
Testosterone, Free: 36 pg/mL — ABNORMAL LOW (ref 47.0–244.0)
Testosterone-% Free: 2.5 % (ref 1.6–2.9)
Testosterone: 145.42 ng/dL — ABNORMAL LOW (ref 300–890)

## 2012-02-20 MED ORDER — TESTOSTERONE CYPIONATE 200 MG/ML IM SOLN: 400.0000 mg | INTRAMUSCULAR | Status: DC

## 2012-03-02 ENCOUNTER — Ambulatory Visit (INDEPENDENT_AMBULATORY_CARE_PROVIDER_SITE_OTHER): Payer: 59 | Admitting: Physician Assistant

## 2012-03-02 VITALS — BP 131/84 | HR 72

## 2012-03-02 DIAGNOSIS — E291 Testicular hypofunction: Secondary | ICD-10-CM

## 2012-03-02 MED ORDER — TESTOSTERONE CYPIONATE 200 MG/ML IM SOLN
300.0000 mg | Freq: Once | INTRAMUSCULAR | Status: AC
  Administered 2012-03-02: 300 mg via INTRAMUSCULAR

## 2012-03-02 NOTE — Progress Notes (Signed)
  Subjective:    Patient ID: Wesley Mckay, male    DOB: 09-15-1980, 32 y.o.   MRN: [id number]  HPI Testosterone injection given.   Review of Systems     Objective:   Physical Exam        Assessment & Plan:  Testosterone given without complication. Follow up in 2 weeks. Tandy Gaw PA-C

## 2012-03-05 LAB — TESTOSTERONE, FREE, TOTAL, SHBG
Testosterone, Free: 52.5 pg/mL (ref 47.0–244.0)
Testosterone-% Free: 2.3 % (ref 1.6–2.9)
Testosterone: 225.41 ng/dL — ABNORMAL LOW (ref 300–890)

## 2012-03-14 ENCOUNTER — Ambulatory Visit (INDEPENDENT_AMBULATORY_CARE_PROVIDER_SITE_OTHER): Payer: 59 | Admitting: Physician Assistant

## 2012-03-14 ENCOUNTER — Encounter: Payer: Self-pay | Admitting: Physician Assistant

## 2012-03-14 VITALS — BP 136/89 | HR 78 | Wt 246.0 lb

## 2012-03-14 DIAGNOSIS — E039 Hypothyroidism, unspecified: Secondary | ICD-10-CM

## 2012-03-14 DIAGNOSIS — N529 Male erectile dysfunction, unspecified: Secondary | ICD-10-CM

## 2012-03-14 DIAGNOSIS — E291 Testicular hypofunction: Secondary | ICD-10-CM

## 2012-03-14 NOTE — Progress Notes (Signed)
  Subjective:    Patient ID: Wesley Mckay, male    DOB: 1980/02/21, 32 y.o.   MRN: [id number]  Erectile Dysfunction This is a new problem. The current episode started more than 1 month ago. The problem has been gradually worsening since onset. The nature of his difficulty is maintaining erection. Non-physiologic factors contributing to erectile dysfunction are anxiety. He reports no decreased libido or performance anxiety. He reports his erection duration to be more than 10 minutes (success rate and is 80 percent ). Irritative symptoms include frequency. Irritative symptoms do not include nocturia or urgency. Obstructive symptoms do not include dribbling, incomplete emptying, an intermittent stream, a slower stream, straining or a weak stream. Pertinent negatives include no chills, dysuria, genital pain, hematuria or hesitancy. Nothing aggravates the symptoms. Past treatments include nothing.      Patient is a 32 year old male who presents to the clinic to go over lab results. Pt has been coming in for testosterone shots and we have not been able to get him to the goal of 500. Patient has not even been able to get to the 300 mark. I have instructed nurses to increase dose to 400 at last visit but that did not happen and he was given 300 mg of testosterone. We recheck his levels at one week and was still at 245 which is under the normal level. Patient is feeling like he has more energy and has a higher sex drive. He also has hypothyroidism which was extremely out of control until we started him on medication. We have not checked level in a while we need to do so.        Review of Systems  Constitutional: Negative for chills.  Genitourinary: Positive for frequency. Negative for dysuria, hesitancy, urgency, hematuria, decreased libido, incomplete emptying and nocturia.       Objective:   Physical Exam  Constitutional: He is oriented to person, place, and time. He appears well-developed and  well-nourished.  HENT:  Head: Normocephalic and atraumatic.  Cardiovascular: Normal rate, regular rhythm and normal heart sounds.   Neurological: He is alert and oriented to person, place, and time.  Skin: Skin is warm and dry.  Psychiatric: He has a normal mood and affect. His behavior is normal.          Assessment & Plan:  Male hypogonadism/hypothyroidism- a plan for today is to increase testosterone to 400 mg at next visit which is Friday. We'll recheck testosterone level one week later. I also added a thyroid and to recheck to make sure we have on adequate dose.   Erectile dysfunction-discuss the possibility of psychological issues like anxiety affecting completion rate. I did not see any medications on meds list that should be effecting dysfunction. Patient has no symptoms of BPH and PSA was normal. Patient currently does not have hypertension, hyperlipidemia or diabetes. I did offer counseling for ED or in trying medication for anxiety. He does not want to do either at this point. I did make him aware medication that could help and when that we could try. He is not ready for medication at this point. We have decided that we will wait to get testosterone levels under control and then reassess the situation.

## 2012-03-14 NOTE — Patient Instructions (Addendum)
Will give increased testosterone shot of 400mg  on Friday. Then the next Friday come and have labs drawn. Then we can see.   Erectile Dysfunction Erectile dysfunction (ED) is the inability to get a good enough erection to have sexual intercourse. ED may involve:  Inability to get an erection.  Lack of enough hardness to allow penetration.  Loss of the erection before sex is finished.  Premature ejaculation.  Any combination of these problems if they occur more than 25% of the time. CAUSES  Certain drugs, such as:  Pain relievers.  Antihistamines.  Antidepressants.  Blood pressure medicines.  Water pills.  Ulcer medicines.  Muscle relaxants.  Illegal drugs.  Excessive drinking.  Psychological causes, such as:  Anxiety.  Depression.  Sadness.  Exhaustion.  Performance fear.  Stress.  Physical causes, such as:  Artery problems. This may include diabetes, smoking, liver disease, or atherosclerosis.  High blood pressure.  Hormonal problems, such as low testosterone.  Obesity.  Nerve problems. This may include back or pelvic injuries, diabetes, multiple sclerosis, Parkinson's disease, or some surgeries. SYMPTOMS  Inability to get an erection.  Lack of enough hardness to allow penetration.  Loss of the erection before sex is finished.  Premature ejaculation.  Normal erections at some times, but with frequent unsatisfactory episodes.  Orgasms that are not satisfactory in sensation or frequency.  Low sexual satisfaction in either partner because of erection problems.  A curved penis occurring with erection. The curve may cause pain or may be too curved to allow for intercourse.  Never having nighttime erections. DIAGNOSIS Your caregiver can often diagnose this condition by:  Performing a physical exam to find other diseases or specific problems with the penis.  Asking you detailed questions about the problem.  Performing blood tests to  check for diabetes or to measure hormone levels.  Performing urine tests to find other underlying health conditions.  Performing an ultrasound to check for scarring.  Performing a test to check blood flow to the penis.  Doing a sleep study at home to measure nighttime erections. TREATMENT   You may be prescribed medicines by mouth.  You may be given medicine injections into the penis.  You may be prescribed a vacuum pump with a ring.  Penile implant surgery may be performed. You may receive:  An inflatable implant.  A semi-rigid implant.  Blood vessel surgery may be performed. HOME CARE INSTRUCTIONS  Take all medicine as directed by your caregiver. Do not take any other medicines without talking to your caregiver first.  Follow your caregiver's directions for specific treatments as prescribed.  Follow up with your caregiver as directed. Document Released: 01/29/2000 Document Revised: 04/25/2011 Document Reviewed: 05/23/2010 [facility] Patient Information 2013 [place], [place].

## 2012-03-16 ENCOUNTER — Ambulatory Visit (INDEPENDENT_AMBULATORY_CARE_PROVIDER_SITE_OTHER): Payer: 59 | Admitting: Physician Assistant

## 2012-03-16 VITALS — BP 135/65

## 2012-03-16 DIAGNOSIS — E291 Testicular hypofunction: Secondary | ICD-10-CM

## 2012-03-16 MED ORDER — TESTOSTERONE CYPIONATE 200 MG/ML IM SOLN
400.0000 mg | INTRAMUSCULAR | Status: DC
  Administered 2012-03-16: 400 mg via INTRAMUSCULAR

## 2012-03-16 NOTE — Progress Notes (Signed)
  Subjective:    Patient ID: Wesley Mckay, male    DOB: Feb 22, 1980, 32 y.o.   MRN: [id number]  Wesley Mckay denies chest pain, shortness of breath or mood changes.     HPI    Review of Systems     Objective:   Physical Exam        Assessment & Plan:  Patient given 400mg  of testosterone. Will recheck level in one week. Tandy Gaw PA-C

## 2012-03-24 LAB — TSH: TSH: 1.226 u[IU]/mL (ref 0.350–4.500)

## 2012-03-26 LAB — TESTOSTERONE, FREE, TOTAL, SHBG
Sex Hormone Binding: 14 nmol/L (ref 13–71)
Testosterone: 1115 ng/dL — ABNORMAL HIGH (ref 300–890)

## 2012-03-30 ENCOUNTER — Ambulatory Visit: Payer: 59

## 2012-04-06 ENCOUNTER — Ambulatory Visit (INDEPENDENT_AMBULATORY_CARE_PROVIDER_SITE_OTHER): Payer: 59 | Admitting: Physician Assistant

## 2012-04-06 VITALS — BP 121/69 | HR 55

## 2012-04-06 DIAGNOSIS — E291 Testicular hypofunction: Secondary | ICD-10-CM

## 2012-04-06 MED ORDER — TESTOSTERONE CYPIONATE 200 MG/ML IM SOLN
350.0000 mg | Freq: Once | INTRAMUSCULAR | Status: AC
  Administered 2012-04-06: 350 mg via INTRAMUSCULAR

## 2012-04-06 NOTE — Progress Notes (Signed)
  Subjective:    Patient ID: Wesley Mckay, male    DOB: [birth year], 32 y.o.   MRN: [id number]  HPI   Here for testosterone injection Review of Systems     Objective:   Physical Exam        Assessment & Plan:

## 2012-04-06 NOTE — Progress Notes (Signed)
  Subjective:    Patient ID: Wesley Mckay, male    DOB: 11-06-1980, 32 y.o.   MRN: [id number]  HPI Pt got testosterone injection today.    Review of Systems     Objective:   Physical Exam        Assessment & Plan:  Testosterone shot was given without complications. REcheck in 1 week testosterone level. 2 weeks for next injection.Tandy Gaw PA-C

## 2012-04-18 ENCOUNTER — Other Ambulatory Visit: Payer: Self-pay | Admitting: Physician Assistant

## 2012-04-23 ENCOUNTER — Ambulatory Visit: Payer: 59

## 2012-04-25 ENCOUNTER — Ambulatory Visit (INDEPENDENT_AMBULATORY_CARE_PROVIDER_SITE_OTHER): Payer: 59 | Admitting: Physician Assistant

## 2012-04-25 VITALS — BP 119/65 | HR 59 | Ht 75.6 in | Wt 251.0 lb

## 2012-04-25 DIAGNOSIS — E291 Testicular hypofunction: Secondary | ICD-10-CM

## 2012-04-25 MED ORDER — TESTOSTERONE CYPIONATE 200 MG/ML IM SOLN
200.0000 mg | Freq: Once | INTRAMUSCULAR | Status: AC
  Administered 2012-04-25: 200 mg via INTRAMUSCULAR

## 2012-04-25 NOTE — Progress Notes (Signed)
No chest pain or SOB.Loralee Pacas [place]

## 2012-05-11 ENCOUNTER — Ambulatory Visit: Payer: 59

## 2012-05-21 ENCOUNTER — Ambulatory Visit (INDEPENDENT_AMBULATORY_CARE_PROVIDER_SITE_OTHER): Payer: 59 | Admitting: Physician Assistant

## 2012-05-21 VITALS — BP 125/75 | HR 71

## 2012-05-21 DIAGNOSIS — E291 Testicular hypofunction: Secondary | ICD-10-CM

## 2012-05-21 MED ORDER — TESTOSTERONE CYPIONATE 100 MG/ML IM SOLN
350.0000 mg | Freq: Once | INTRAMUSCULAR | Status: AC
  Administered 2012-05-21: 350 mg via INTRAMUSCULAR

## 2012-05-21 NOTE — Progress Notes (Addendum)
  Subjective:    Patient ID: Wesley Mckay, male    DOB: Oct 07, 1980, 32 y.o.   MRN: [id number] Testosterone injection. Meyer Cory, LPN  HPI    Review of Systems     Objective:   Physical Exam        Assessment & Plan:  Injection given with no complications. Next injection in 2 weeks. Tandy Gaw PA-C

## 2012-06-04 ENCOUNTER — Ambulatory Visit (INDEPENDENT_AMBULATORY_CARE_PROVIDER_SITE_OTHER): Payer: 59 | Admitting: Physician Assistant

## 2012-06-04 VITALS — BP 136/88 | HR 96

## 2012-06-04 DIAGNOSIS — E291 Testicular hypofunction: Secondary | ICD-10-CM

## 2012-06-04 MED ORDER — TESTOSTERONE CYPIONATE 200 MG/ML IM SOLN
350.0000 mg | Freq: Once | INTRAMUSCULAR | Status: AC
  Administered 2012-06-04: 350 mg via INTRAMUSCULAR

## 2012-06-04 NOTE — Progress Notes (Signed)
  Subjective:    Patient ID: Wesley Mckay, male    DOB: Jun 25, 1980, 32 y.o.   MRN: [id number]  HPI    Review of Systems     Objective:   Physical Exam        Assessment & Plan:  Testosterone injection given with no complications. Next injection in 2 weeks. Aletta Edmunds PA-C.

## 2012-06-18 ENCOUNTER — Ambulatory Visit: Payer: 59

## 2012-06-21 ENCOUNTER — Ambulatory Visit (INDEPENDENT_AMBULATORY_CARE_PROVIDER_SITE_OTHER): Payer: BC Managed Care – PPO | Admitting: Family Medicine

## 2012-06-21 VITALS — BP 151/89 | HR 74 | Wt 245.0 lb

## 2012-06-21 DIAGNOSIS — E291 Testicular hypofunction: Secondary | ICD-10-CM

## 2012-06-21 MED ORDER — TESTOSTERONE CYPIONATE 200 MG/ML IM SOLN
200.0000 mg | Freq: Once | INTRAMUSCULAR | Status: AC
  Administered 2012-06-21: 200 mg via INTRAMUSCULAR

## 2012-06-21 NOTE — Progress Notes (Signed)
Pt denies any SOB or Chest pain.Wesley Mckay

## 2012-06-22 ENCOUNTER — Ambulatory Visit: Payer: 59

## 2012-07-05 ENCOUNTER — Ambulatory Visit (INDEPENDENT_AMBULATORY_CARE_PROVIDER_SITE_OTHER): Payer: BC Managed Care – PPO | Admitting: Family Medicine

## 2012-07-05 VITALS — BP 128/78 | HR 80

## 2012-07-05 DIAGNOSIS — E291 Testicular hypofunction: Secondary | ICD-10-CM

## 2012-07-05 MED ORDER — TESTOSTERONE CYPIONATE 200 MG/ML IM SOLN: 350.0000 mg | Freq: Once | INTRAMUSCULAR | Status: DC

## 2012-07-05 NOTE — Progress Notes (Signed)
  Subjective:    Patient ID: Wesley Mckay, male    DOB: 23-Feb-1980, 32 y.o.   MRN: [id number] Testosterone injection given. Sent pt to lab prior to injection. HPI    Review of Systems     Objective:   Physical Exam        Assessment & Plan:

## 2012-07-06 LAB — TESTOSTERONE, FREE, TOTAL, SHBG: Sex Hormone Binding: 21 nmol/L (ref 13–71)

## 2012-07-19 ENCOUNTER — Ambulatory Visit (INDEPENDENT_AMBULATORY_CARE_PROVIDER_SITE_OTHER): Payer: BC Managed Care – PPO | Admitting: Family Medicine

## 2012-07-19 VITALS — BP 149/81 | HR 81

## 2012-07-19 DIAGNOSIS — E291 Testicular hypofunction: Secondary | ICD-10-CM

## 2012-07-19 MED ORDER — TESTOSTERONE CYPIONATE 200 MG/ML IM SOLN
350.0000 mg | Freq: Once | INTRAMUSCULAR | Status: AC
  Administered 2012-07-19: 350 mg via INTRAMUSCULAR

## 2012-07-19 NOTE — Progress Notes (Signed)
  Subjective:    Patient ID: Wesley Mckay, male    DOB: 03/07/1980, 32 y.o.   MRN: [id number] Testosterone given today IM.  350mg .  No complications. AB, CMA HPI    Review of Systems     Objective:   Physical Exam        Assessment & Plan:

## 2012-07-24 ENCOUNTER — Other Ambulatory Visit: Payer: Self-pay | Admitting: Physician Assistant

## 2012-08-03 ENCOUNTER — Ambulatory Visit (INDEPENDENT_AMBULATORY_CARE_PROVIDER_SITE_OTHER): Payer: BC Managed Care – PPO | Admitting: Physician Assistant

## 2012-08-03 ENCOUNTER — Encounter: Payer: Self-pay | Admitting: *Deleted

## 2012-08-03 VITALS — BP 143/82 | HR 80 | Wt 247.0 lb

## 2012-08-03 DIAGNOSIS — E291 Testicular hypofunction: Secondary | ICD-10-CM

## 2012-08-03 MED ORDER — TESTOSTERONE CYPIONATE 200 MG/ML IM SOLN
350.0000 mg | Freq: Once | INTRAMUSCULAR | Status: AC
  Administered 2012-08-03: 350 mg via INTRAMUSCULAR

## 2012-08-03 NOTE — Progress Notes (Signed)
  Subjective:    Patient ID: Wesley Mckay, male    DOB: 09-04-1980, 32 y.o.   MRN: [id number] Testerone given. Kourtney Terriquez,CMA  HPI    Review of Systems     Objective:   Physical Exam        Assessment & Plan:  Pt tolerated testosterone injection. Follow up in 2 weeks for another injection. Tandy Gaw PA-C

## 2012-08-14 ENCOUNTER — Ambulatory Visit: Payer: BC Managed Care – PPO | Admitting: Sports Medicine

## 2012-08-16 ENCOUNTER — Ambulatory Visit (INDEPENDENT_AMBULATORY_CARE_PROVIDER_SITE_OTHER): Payer: BC Managed Care – PPO | Admitting: Sports Medicine

## 2012-08-16 ENCOUNTER — Ambulatory Visit: Payer: BC Managed Care – PPO | Admitting: *Deleted

## 2012-08-16 VITALS — BP 137/84 | HR 85 | Wt 247.0 lb

## 2012-08-16 DIAGNOSIS — M79609 Pain in unspecified limb: Secondary | ICD-10-CM

## 2012-08-16 DIAGNOSIS — M79672 Pain in left foot: Secondary | ICD-10-CM

## 2012-08-16 DIAGNOSIS — E291 Testicular hypofunction: Secondary | ICD-10-CM

## 2012-08-16 DIAGNOSIS — M79671 Pain in right foot: Secondary | ICD-10-CM | POA: Insufficient documentation

## 2012-08-16 MED ORDER — TESTOSTERONE CYPIONATE 200 MG/ML IM SOLN
350.0000 mg | Freq: Once | INTRAMUSCULAR | Status: AC
  Administered 2012-08-16: 350 mg via INTRAMUSCULAR

## 2012-08-16 MED ORDER — MELOXICAM 15 MG PO TABS: ORAL_TABLET | ORAL | Status: DC

## 2012-08-16 NOTE — Assessment & Plan Note (Signed)
Testosterone injection 

## 2012-08-16 NOTE — Assessment & Plan Note (Signed)
There is a combination of plantar fasciitis and tibialis posterior tendinitis. Home exercises, Mobic, he will come back for custom orthotics. If no better we can to reconsider interventional treatment.

## 2012-08-16 NOTE — Progress Notes (Signed)
  Subjective:    CC: Foot pain  HPI: Wesley Mckay is a very pleasant 32 year old male who's had years of pain he localizes at the calcaneal insertion of the plantar fascia. Pain is worse with the first few steps in the morning, better through the day, and worse after a long day, feels as though there is a spike stabbing into his heel. He also has pain he localizes just posterior to the medial malleolus. This pain runs from the lower leg, around the malleolus, to the navicular prominence. Pain is moderate, persistent.  Past medical history, Surgical history, Family history not pertinant except as noted below, Social history, Allergies, and medications have been entered into the medical record, reviewed, and no changes needed.   Review of Systems: No fevers, chills, night sweats, weight loss, chest pain, or shortness of breath.   Objective:    General: Well Developed, well nourished, and in no acute distress.  Neuro: Alert and oriented x3, extra-ocular muscles intact, sensation grossly intact.  HEENT: Normocephalic, atraumatic, pupils equal round reactive to light, neck supple, no masses, no lymphadenopathy, thyroid nonpalpable.  Skin: Warm and dry, no rashes. Cardiac: Regular rate and rhythm, no murmurs rubs or gallops, no lower extremity edema.  Respiratory: Clear to auscultation bilaterally. Not using accessory muscles, speaking in full sentences. Bilateral Ankle: No visible erythema or swelling. Marked pes planus. Range of motion is full in all directions. Strength is 5/5 in all directions. Stable lateral and medial ligaments; squeeze test and kleiger test unremarkable; Talar dome nontender; No pain at base of 5th MT; No tenderness over cuboid; No tenderness over N spot or navicular prominence Tender to palpation along the tibialis posterior tendon bilaterally, no reproduction of pain with resisted inversion. No sign of peroneal tendon subluxations or tenderness to palpation Negative tarsal  tunnel tinel's Tender to palpation at the calcaneal insertion of the plantar fascia bilaterally.  Impression and Recommendations:

## 2012-08-27 ENCOUNTER — Encounter: Payer: Self-pay | Admitting: Sports Medicine

## 2012-08-27 ENCOUNTER — Ambulatory Visit (INDEPENDENT_AMBULATORY_CARE_PROVIDER_SITE_OTHER): Payer: BC Managed Care – PPO | Admitting: Sports Medicine

## 2012-08-27 VITALS — BP 122/71 | HR 87 | Wt 250.0 lb

## 2012-08-27 DIAGNOSIS — M79609 Pain in unspecified limb: Secondary | ICD-10-CM

## 2012-08-27 DIAGNOSIS — M79671 Pain in right foot: Secondary | ICD-10-CM

## 2012-08-27 NOTE — Assessment & Plan Note (Signed)
Custom orthotics with large scaphoid pads. Continue exercise, return in 3 weeks, can consider injection at the calcaneal insertion of the plantar fascia as well as possibly the tibialis posterior tendon sheath if no better.

## 2012-08-27 NOTE — Progress Notes (Signed)
    Patient was fitted for a : standard, cushioned, semi-rigid orthotic. The orthotic was heated and afterward the patient stood on the orthotic blank positioned on the orthotic stand. The patient was positioned in subtalar neutral position and 10 degrees of ankle dorsiflexion in a weight bearing stance. After completion of molding, a stable base was applied to the orthotic blank. The blank was ground to a stable position for weight bearing. Size:14 Base: [facility] [place]: Large scaphoid pads The patient ambulated these, and they were very comfortable.  I spent 40 minutes with this patient, greater than 50% was face-to-face time counseling regarding the below diagnosis.

## 2012-09-03 ENCOUNTER — Ambulatory Visit (INDEPENDENT_AMBULATORY_CARE_PROVIDER_SITE_OTHER): Payer: BC Managed Care – PPO | Admitting: Physician Assistant

## 2012-09-03 VITALS — BP 128/78 | HR 62

## 2012-09-03 DIAGNOSIS — E291 Testicular hypofunction: Secondary | ICD-10-CM

## 2012-09-03 MED ORDER — TESTOSTERONE CYPIONATE 200 MG/ML IM SOLN
350.0000 mg | INTRAMUSCULAR | Status: DC
  Administered 2012-09-03: 350 mg via INTRAMUSCULAR

## 2012-09-03 NOTE — Progress Notes (Signed)
  Subjective:    Patient ID: Wesley Mckay, male    DOB: 12-10-1980, 32 y.o.   MRN: [id number]  HPI   Here for testosterone injection Review of Systems     Objective:   Physical Exam        Assessment & Plan:  Given without complication

## 2012-09-17 ENCOUNTER — Ambulatory Visit: Payer: BC Managed Care – PPO | Admitting: Sports Medicine

## 2012-09-17 ENCOUNTER — Ambulatory Visit: Payer: BC Managed Care – PPO | Admitting: *Deleted

## 2012-09-17 DIAGNOSIS — Z0289 Encounter for other administrative examinations: Secondary | ICD-10-CM

## 2012-09-18 DIAGNOSIS — R799 Abnormal finding of blood chemistry, unspecified: Secondary | ICD-10-CM | POA: Insufficient documentation

## 2012-09-18 DIAGNOSIS — G479 Sleep disorder, unspecified: Secondary | ICD-10-CM | POA: Insufficient documentation

## 2012-09-18 DIAGNOSIS — G4721 Circadian rhythm sleep disorder, delayed sleep phase type: Secondary | ICD-10-CM | POA: Insufficient documentation

## 2012-10-01 ENCOUNTER — Ambulatory Visit: Payer: BC Managed Care – PPO

## 2012-10-09 ENCOUNTER — Ambulatory Visit: Payer: BC Managed Care – PPO

## 2012-10-12 ENCOUNTER — Ambulatory Visit (INDEPENDENT_AMBULATORY_CARE_PROVIDER_SITE_OTHER): Payer: BC Managed Care – PPO | Admitting: Physician Assistant

## 2012-10-12 ENCOUNTER — Encounter: Payer: Self-pay | Admitting: Physician Assistant

## 2012-10-12 VITALS — BP 137/84 | HR 63 | Wt 249.0 lb

## 2012-10-12 DIAGNOSIS — N529 Male erectile dysfunction, unspecified: Secondary | ICD-10-CM

## 2012-10-12 DIAGNOSIS — E291 Testicular hypofunction: Secondary | ICD-10-CM

## 2012-10-12 DIAGNOSIS — G4700 Insomnia, unspecified: Secondary | ICD-10-CM

## 2012-10-12 DIAGNOSIS — E039 Hypothyroidism, unspecified: Secondary | ICD-10-CM

## 2012-10-12 DIAGNOSIS — Z131 Encounter for screening for diabetes mellitus: Secondary | ICD-10-CM

## 2012-10-12 MED ORDER — TESTOSTERONE CYPIONATE 200 MG/ML IM SOLN
200.0000 mg | Freq: Once | INTRAMUSCULAR | Status: AC
  Administered 2012-10-12: 200 mg via INTRAMUSCULAR

## 2012-10-12 MED ORDER — SILDENAFIL CITRATE 50 MG PO TABS: 50.0000 mg | ORAL_TABLET | Freq: Every day | ORAL | Status: DC | PRN

## 2012-10-12 MED ORDER — CLONAZEPAM 0.5 MG PO TABS: 0.5000 mg | ORAL_TABLET | Freq: Every evening | ORAL | Status: DC | PRN

## 2012-10-12 NOTE — Patient Instructions (Addendum)
Erectile Dysfunction Erectile dysfunction (ED) is the inability to get a good enough erection to have sexual intercourse. ED may involve:  Inability to get an erection.  Lack of enough hardness to allow penetration.  Loss of the erection before sex is finished.  Premature ejaculation.  Any combination of these problems if they occur more than 25% of the time. CAUSES  Certain drugs, such as:  Pain relievers.  Antihistamines.  Antidepressants.  Blood pressure medicines.  Water pills.  Ulcer medicines.  Muscle relaxants.  Illegal drugs.  Excessive drinking.  Psychological causes, such as:  Anxiety.  Depression.  Sadness.  Exhaustion.  Performance fear.  Stress.  Physical causes, such as:  Artery problems. This may include diabetes, smoking, liver disease, or atherosclerosis.  High blood pressure.  Hormonal problems, such as low testosterone.  Obesity.  Nerve problems. This may include back or pelvic injuries, diabetes, multiple sclerosis, Parkinson's disease, or some surgeries. SYMPTOMS  Inability to get an erection.  Lack of enough hardness to allow penetration.  Loss of the erection before sex is finished.  Premature ejaculation.  Normal erections at some times, but with frequent unsatisfactory episodes.  Orgasms that are not satisfactory in sensation or frequency.  Low sexual satisfaction in either partner because of erection problems.  A curved penis occurring with erection. The curve may cause pain or may be too curved to allow for intercourse.  Never having nighttime erections. DIAGNOSIS Your caregiver can often diagnose this condition by:  Performing a physical exam to find other diseases or specific problems with the penis.  Asking you detailed questions about the problem.  Performing blood tests to check for diabetes or to measure hormone levels.  Performing urine tests to find other underlying health  conditions.  Performing an ultrasound to check for scarring.  Performing a test to check blood flow to the penis.  Doing a sleep study at home to measure nighttime erections. TREATMENT   You may be prescribed medicines by mouth.  You may be given medicine injections into the penis.  You may be prescribed a vacuum pump with a ring.  Penile implant surgery may be performed. You may receive:  An inflatable implant.  A semi-rigid implant.  Blood vessel surgery may be performed. HOME CARE INSTRUCTIONS  Take all medicine as directed by your caregiver. Do not take any other medicines without talking to your caregiver first.  Follow your caregiver's directions for specific treatments as prescribed.  Follow up with your caregiver as directed. Document Released: 01/29/2000 Document Revised: 04/25/2011 Document Reviewed: 05/23/2010 [employer] Patient Information 2014 [employer].  

## 2012-10-12 NOTE — Progress Notes (Signed)
Subjective:    Patient ID: Wesley Mckay, male    DOB: [birth year], 32 y.o.   MRN: [id number]  HPI Patient is a 32 year old male who presents to the clinic to get his testosterone shot. He would also like to discuss erectile dysfunction and insomnia.  Patient is interested in his girlfriend who is a nurse giving him his testosterone shots that he does not have to come into the clinic so frequently. He would like to know how to go about doing this.  Patient has had an ongoing history for the last couple years with erectile dysfunction. He reports that his penis will go flaccid before he can orgasm and ejaculate. He has no problem with stimulation and becoming or rales he has problems with finishing. He denies any psychological issues or becoming nervous. He feels very comfortable with his partner. He thought testosterone shots which helped this problem but it has not. He denies any urinary issues. He does not have any frequency or weak stream.  Patient is still struggling with insomnia. He was taken to [facility] en route and melatonin and was doing well. Over the past month it has become more and more difficult for him to go to sleep. He did try one of his Klonopin and it has helped significantly. He is able to get a good nights rest. He does not take Klonopin every night to sleep but likes to have it when needed. He denies taking the Klonopin during the day.  Patient has ongoing hypothyroidism. He has just recently made the switch not taking Synthroid with antacids. He wonders if this is why he was on such a high dose. He would like recheck today. He is currently been taking Synthroid with nothing else for the last 2 months.   Review of Systems     Objective:   Physical Exam  Constitutional: He is oriented to person, place, and time. He appears well-developed and well-nourished.  HENT:  Head: Normocephalic and atraumatic.  Neck: Normal range of motion. Neck supple. No thyromegaly present.   Cardiovascular: Normal rate, regular rhythm and normal heart sounds.   Pulmonary/Chest: Effort normal and breath sounds normal. He has no wheezes.  Lymphadenopathy:    He has no cervical adenopathy.  Neurological: He is alert and oriented to person, place, and time.  Skin: Skin is warm and dry.  Psychiatric: He has a normal mood and affect. His behavior is normal.          Assessment & Plan:  Hypogonadism-patient was given his testosterone shot today. He was told to bring his girlfriend in 2 weeks for her next shot and she could give injection and get approved. We can then send syringe his and testosterone to the pharmacy. He is not due for any PSA testing or hematocrit at this time.  ED-patient was given a sample card of Viagra to try. He was also given a prescription if he liked it. Patient was warned of side effects of headache. He was instructed to take 30 minutes to one hour before sexual activity. Call if not improving or having side effects. I looked at his medication list and did not see anything that should be causing ED. I will screen for diabetes with fasting glucose today. I did give patient a handout on ED for him to look over causes and see in there might be anything he is doing that could be worsening symptoms.  Hypothyroidism-we'll recheck thyroid today. Want to make sure thyroid is in normal range  after not taking with antacids.  Insomnia-I. did give Klonopin for patient to use at bedtime as needed for sleep. I instructed patient only to use as needed. I encouraged him to work on bedtime routine. I encouraged him to stay on melatonin to help with sleep cycle.

## 2012-10-18 ENCOUNTER — Ambulatory Visit (INDEPENDENT_AMBULATORY_CARE_PROVIDER_SITE_OTHER): Payer: BC Managed Care – PPO | Admitting: Sports Medicine

## 2012-10-18 ENCOUNTER — Encounter: Payer: Self-pay | Admitting: Sports Medicine

## 2012-10-18 VITALS — BP 132/80 | HR 83 | Wt 250.0 lb

## 2012-10-18 DIAGNOSIS — M79609 Pain in unspecified limb: Secondary | ICD-10-CM

## 2012-10-18 DIAGNOSIS — M722 Plantar fascial fibromatosis: Secondary | ICD-10-CM

## 2012-10-18 DIAGNOSIS — M79671 Pain in right foot: Secondary | ICD-10-CM

## 2012-10-18 NOTE — Progress Notes (Signed)
  Subjective:    CC: Followup  HPI: Bilateral plantar fasciitis: Tibialis posterior pain has now resolved bilaterally after home exercises and orthotics. His left foot pain has decreased any percent, right foot pain is still a 7/10. He has never had an injection. Pain is continuing to be localized to the calcaneal insertion of the plantar fascia, without radiation, moderate, persistent.  Past medical history, Surgical history, Family history not pertinant except as noted below, Social history, Allergies, and medications have been entered into the medical record, reviewed, and no changes needed.   Review of Systems: No fevers, chills, night sweats, weight loss, chest pain, or shortness of breath.   Objective:    General: Well Developed, well nourished, and in no acute distress.  Neuro: Alert and oriented x3, extra-ocular muscles intact, sensation grossly intact.  HEENT: Normocephalic, atraumatic, pupils equal round reactive to light, neck supple, no masses, no lymphadenopathy, thyroid nonpalpable.  Skin: Warm and dry, no rashes. Cardiac: Regular rate and rhythm, no murmurs rubs or gallops, no lower extremity edema.  Respiratory: Clear to auscultation bilaterally. Not using accessory muscles, speaking in full sentences.  Procedure: Real-time Ultrasound Guided Injection of right plantar fascia Device: GE Logiq E  Verbal informed consent obtained.  Time-out conducted.  Noted no overlying erythema, induration, or other signs of local infection.  Skin prepped in a sterile fashion.  Local anesthesia: Topical Ethyl chloride.  With sterile technique and under real time ultrasound guidance:  1 cc Kenalog 40, 2 cc lidocaine injected just deep to the calcaneal insertion of the plantar fascia. Completed without difficulty  Pain immediately resolved suggesting accurate placement of the medication.  Advised to call if fevers/chills, erythema, induration, drainage, or persistent bleeding.  Images  permanently stored and available for review in the ultrasound unit.  Impression: Technically successful ultrasound guided injection.  Procedure: Real-time Ultrasound Guided Injection of left plantar fascia Device: GE Logiq E  Verbal informed consent obtained.  Time-out conducted.  Noted no overlying erythema, induration, or other signs of local infection.  Skin prepped in a sterile fashion.  Local anesthesia: Topical Ethyl chloride.  With sterile technique and under real time ultrasound guidance:  1 cc Kenalog 40, 2 cc lidocaine injected just deep to the calcaneal insertion of the plantar fascia. Completed without difficulty  Pain immediately resolved suggesting accurate placement of the medication.  Advised to call if fevers/chills, erythema, induration, drainage, or persistent bleeding.  Images permanently stored and available for review in the ultrasound unit.  Impression: Technically successful ultrasound guided injection.  Impression and Recommendations:

## 2012-10-18 NOTE — Assessment & Plan Note (Signed)
Tibialis posterior tendinitis has resolved, injected both plantar fascia. Return to see me in one month.

## 2012-10-19 ENCOUNTER — Other Ambulatory Visit: Payer: Self-pay | Admitting: *Deleted

## 2012-10-19 ENCOUNTER — Other Ambulatory Visit: Payer: Self-pay | Admitting: Physician Assistant

## 2012-10-19 MED ORDER — SILDENAFIL CITRATE 20 MG PO TABS: 20.0000 mg | ORAL_TABLET | Freq: Every day | ORAL | Status: DC | PRN

## 2012-10-19 MED ORDER — SILDENAFIL CITRATE 50 MG PO TABS: 50.0000 mg | ORAL_TABLET | Freq: Every day | ORAL | Status: DC | PRN

## 2012-10-19 NOTE — Progress Notes (Signed)
Sent new rx for viagra to pharmacy.

## 2012-10-23 LAB — COMPLETE METABOLIC PANEL WITH GFR
ALT: 11 U/L (ref 0–53)
AST: 15 U/L (ref 0–37)
CO2: 26 mEq/L (ref 19–32)
Chloride: 104 mEq/L (ref 96–112)
GFR, Est African American: >89 mL/min
Sodium: 140 mEq/L (ref 135–145)
Total Bilirubin: 0.6 mg/dL (ref 0.3–1.2)
Total Protein: 6.8 g/dL (ref 6.0–8.3)

## 2012-10-23 LAB — TSH: TSH: 2.862 u[IU]/mL (ref 0.350–4.500)

## 2012-10-25 ENCOUNTER — Encounter: Payer: Self-pay | Admitting: *Deleted

## 2012-10-25 ENCOUNTER — Ambulatory Visit (INDEPENDENT_AMBULATORY_CARE_PROVIDER_SITE_OTHER): Payer: BC Managed Care – PPO | Admitting: Sports Medicine

## 2012-10-25 ENCOUNTER — Telehealth: Payer: Self-pay

## 2012-10-25 VITALS — BP 132/82 | HR 67 | Wt 246.0 lb

## 2012-10-25 DIAGNOSIS — E291 Testicular hypofunction: Secondary | ICD-10-CM

## 2012-10-25 MED ORDER — TESTOSTERONE CYPIONATE 200 MG/ML IM SOLN
200.0000 mg | INTRAMUSCULAR | Status: DC
  Administered 2012-10-25: 200 mg via INTRAMUSCULAR

## 2012-10-25 NOTE — Telephone Encounter (Signed)
Patient was seen in office today for Testosterone injections, his girlfriend came in with him and she performed the injection. She gave it to him  the correct  Way. There was nothing noted  from where the injection was given. He wants a Rx for testosterone and syringes sent to his pharmacy he also want to know when do he come back in to have his levels tested. Terre Zabriskie,CMA .

## 2012-10-25 NOTE — Progress Notes (Signed)
  Subjective:    Patient ID: Wesley Mckay, male    DOB: 1980-04-26, 32 y.o.   MRN: [id number]   Patient was given 200 mg of Testosterone. There were no complications at the injection site.  HPI    Review of Systems     Objective:   Physical Exam        Assessment & Plan:   I was present for all essential parts of this visit and procedure. Ihor Austin. Benjamin Stain, M.D.

## 2012-10-25 NOTE — Assessment & Plan Note (Signed)
Testosterone injection as above. He would benefit from a recheck in about one week, I will place the order.

## 2012-10-29 ENCOUNTER — Other Ambulatory Visit: Payer: Self-pay | Admitting: Physician Assistant

## 2012-10-29 MED ORDER — TESTOSTERONE CYPIONATE 200 MG/ML IM SOLN: 200.0000 mg | INTRAMUSCULAR | Status: DC

## 2012-10-29 MED ORDER — AMBULATORY NON FORMULARY MEDICATION: Status: DC

## 2012-10-29 NOTE — Telephone Encounter (Signed)
We use the 3 ml syringe and the 22 1 1/2 gauge needle.  Margrette Wynia,CMA

## 2012-10-29 NOTE — Telephone Encounter (Signed)
What syringes and needles do you guys use for testosterone injections. I need to order for pt to give to himself and wanted to know.

## 2012-10-29 NOTE — Telephone Encounter (Signed)
Thanks rx sent

## 2012-11-15 ENCOUNTER — Ambulatory Visit (INDEPENDENT_AMBULATORY_CARE_PROVIDER_SITE_OTHER): Payer: BC Managed Care – PPO | Admitting: Sports Medicine

## 2012-11-15 ENCOUNTER — Encounter: Payer: Self-pay | Admitting: Sports Medicine

## 2012-11-15 VITALS — BP 129/78 | HR 67 | Wt 241.0 lb

## 2012-11-15 DIAGNOSIS — M79609 Pain in unspecified limb: Secondary | ICD-10-CM

## 2012-11-15 DIAGNOSIS — E291 Testicular hypofunction: Secondary | ICD-10-CM

## 2012-11-15 DIAGNOSIS — M79671 Pain in right foot: Secondary | ICD-10-CM

## 2012-11-15 MED ORDER — TESTOSTERONE CYPIONATE 200 MG/ML IM SOLN: 200.0000 mg | INTRAMUSCULAR | Status: DC

## 2012-11-15 NOTE — Progress Notes (Signed)
  Subjective:    CC: Follow up  HPI: Bilateral plantar fasciitis: Resolved after custom orthotics, home exercises, and bilateral injection at the last visit. More recently he started to have a little bit of pain come back, approximately 1-2/10, but is overall very happy with the results so far. He does desire to come back for a second set of custom orthotics.  Male hypogonadism: testosterone levels have been fluctuating, he is currently doing 200 mg of testosterone intramuscular at home. His wife is in the medical field and has been giving him shots.  Past medical history, Surgical history, Family history not pertinant except as noted below, Social history, Allergies, and medications have been entered into the medical record, reviewed, and no changes needed.   Review of Systems: No fevers, chills, night sweats, weight loss, chest pain, or shortness of breath.   Objective:    General: Well Developed, well nourished, and in no acute distress.  Neuro: Alert and oriented x3, extra-ocular muscles intact, sensation grossly intact.  HEENT: Normocephalic, atraumatic, pupils equal round reactive to light, neck supple, no masses, no lymphadenopathy, thyroid nonpalpable.  Skin: Warm and dry, no rashes. Cardiac: Regular rate and rhythm, no murmurs rubs or gallops, no lower extremity edema.  Respiratory: Clear to auscultation bilaterally. Not using accessory muscles, speaking in full sentences.  Impression and Recommendations:

## 2012-11-15 NOTE — Assessment & Plan Note (Signed)
Pain has resolved after injections as well as custom orthotics. Return as needed for this.

## 2012-11-15 NOTE — Assessment & Plan Note (Signed)
Patient is going to be transitioning to home testosterone injections. Testosterone levels have been fairly labile. I will write him a prescription for the vial, 23-gauge needles, syringes I would like him to do 3 shots and then return exactly one week after his third shot for blood work to be checked.Wesley Mckay

## 2013-01-31 ENCOUNTER — Other Ambulatory Visit: Payer: Self-pay | Admitting: Physician Assistant

## 2013-02-01 ENCOUNTER — Other Ambulatory Visit: Payer: Self-pay | Admitting: *Deleted

## 2013-02-01 MED ORDER — SILDENAFIL CITRATE 50 MG PO TABS: 50.0000 mg | ORAL_TABLET | Freq: Every day | ORAL | Status: DC | PRN

## 2013-02-04 ENCOUNTER — Other Ambulatory Visit: Payer: Self-pay | Admitting: Physician Assistant

## 2013-04-19 ENCOUNTER — Telehealth: Payer: Self-pay | Admitting: *Deleted

## 2013-04-19 DIAGNOSIS — E039 Hypothyroidism, unspecified: Secondary | ICD-10-CM

## 2013-04-20 LAB — TSH: TSH: 6.611 u[IU]/mL — ABNORMAL HIGH (ref 0.350–4.500)

## 2013-04-22 LAB — TESTOSTERONE, FREE, TOTAL, SHBG
Sex Hormone Binding: 20 nmol/L (ref 13–71)
Testosterone, Free: 246.1 pg/mL — ABNORMAL HIGH (ref 47.0–244.0)
Testosterone-% Free: 2.9 % (ref 1.6–2.9)
Testosterone: 843 ng/dL (ref 300–890)

## 2013-04-23 ENCOUNTER — Other Ambulatory Visit: Payer: Self-pay | Admitting: Physician Assistant

## 2013-04-23 MED ORDER — LEVOTHYROXINE SODIUM 50 MCG PO TABS: 50.0000 ug | ORAL_TABLET | Freq: Every day | ORAL | Status: DC

## 2013-04-24 ENCOUNTER — Encounter: Payer: Self-pay | Admitting: Physician Assistant

## 2013-04-24 ENCOUNTER — Ambulatory Visit (INDEPENDENT_AMBULATORY_CARE_PROVIDER_SITE_OTHER): Payer: BC Managed Care – PPO | Admitting: Physician Assistant

## 2013-04-24 VITALS — BP 122/79 | HR 75 | Temp 98.5°F | Ht 75.0 in | Wt 260.0 lb

## 2013-04-24 DIAGNOSIS — E039 Hypothyroidism, unspecified: Secondary | ICD-10-CM

## 2013-04-24 DIAGNOSIS — E291 Testicular hypofunction: Secondary | ICD-10-CM

## 2013-04-24 DIAGNOSIS — K219 Gastro-esophageal reflux disease without esophagitis: Secondary | ICD-10-CM

## 2013-04-24 MED ORDER — DEXLANSOPRAZOLE 60 MG PO CPDR: 60.0000 mg | DELAYED_RELEASE_CAPSULE | Freq: Every day | ORAL | Status: DC

## 2013-04-24 MED ORDER — TESTOSTERONE CYPIONATE 200 MG/ML IM SOLN: 200.0000 mg | INTRAMUSCULAR | Status: DC

## 2013-04-24 MED ORDER — AMBULATORY NON FORMULARY MEDICATION: Status: DC

## 2013-04-25 ENCOUNTER — Other Ambulatory Visit: Payer: Self-pay | Admitting: Physician Assistant

## 2013-04-26 NOTE — Progress Notes (Signed)
   Subjective:    Patient ID: Wesley Mckay, male    DOB: 1980-11-08, 33 y.o.   MRN: [id number]  HPI Pt presents to the clinic to follow up.   Hypothyroidism- first diagnosesed in 2013 with TSH of 29. Started treatment with levothyroxine. Doing well but now TSH up and down and at dose of 262mcg. Would like to see specialist. Pt's wife concerned with some hypopigmenation of penis and hands and wonders if could be vitiligo. No itching or pain. Has had since young age. She wonders about auto-immune diseases.   Hypogonadism- wift is giving him testosterone shots and doing well needs refill.    GERD- not taking prilosec regularly because needs to take in am but never remembers because has to take levothyroxine. Has some indigestion and occasional abdominal pain in upper abdomen. Denies any nausea or vomiting.    Review of Systems     Objective:   Physical Exam  Constitutional: He appears well-developed and well-nourished.  HENT:  Head: Normocephalic and atraumatic.  Cardiovascular: Normal rate, regular rhythm and normal heart sounds.   Pulmonary/Chest: Effort normal and breath sounds normal.  Abdominal: Soft. Bowel sounds are normal. He exhibits no distension and no mass. There is no tenderness.  Psychiatric: He has a normal mood and affect. His behavior is normal.          Assessment & Plan:  Hypothyroidism- continue taking 252mcg in am before anything to eat or other medications. Per pt mostly consistent. Will refer to endocrinology. Gave pt lab hx of TSH. Will get t4 today to see what free thyroid is doing.    hypogonadism last testosterone recheck was in January and 800 doing great. Refilled testosterone continue on same dose.   GERD- gave rx and samples of dexilant to try since prilosec not working and not able to remember to take in am but without levothyroxine.

## 2013-04-26 NOTE — Patient Instructions (Signed)
Will refer to endocrinologist 

## 2013-06-14 ENCOUNTER — Other Ambulatory Visit: Payer: Self-pay | Admitting: Physician Assistant

## 2013-06-17 NOTE — Telephone Encounter (Signed)
Can you look at the directions for Viagra 1 to 5 tablets as needed just doesn't seeem right to me. Clemetine Marker, LPN

## 2013-06-17 NOTE — Telephone Encounter (Signed)
I have never sent that particular medication. It's use is usually for pulmonary HTN. I have only send viagra 50mg  as needed for intercourse. Do not refill that particular sildenafil.

## 2013-07-25 ENCOUNTER — Other Ambulatory Visit: Payer: Self-pay | Admitting: Physician Assistant

## 2013-07-26 ENCOUNTER — Telehealth: Payer: Self-pay

## 2013-07-26 ENCOUNTER — Other Ambulatory Visit: Payer: Self-pay | Admitting: *Deleted

## 2013-07-26 DIAGNOSIS — E291 Testicular hypofunction: Secondary | ICD-10-CM

## 2013-07-26 MED ORDER — SILDENAFIL CITRATE 50 MG PO TABS: 50.0000 mg | ORAL_TABLET | Freq: Every day | ORAL | Status: DC | PRN

## 2013-07-26 MED ORDER — TESTOSTERONE CYPIONATE 200 MG/ML IM SOLN: 200.0000 mg | INTRAMUSCULAR | Status: DC

## 2013-07-26 NOTE — Telephone Encounter (Signed)
He wants a call back from [employer] or [place]. He stated you would know what it is about.   Needs refill on viagra, synthroid and testosterone. Also needs syringes and needles.

## 2013-07-27 ENCOUNTER — Other Ambulatory Visit: Payer: Self-pay | Admitting: Physician Assistant

## 2013-09-18 ENCOUNTER — Other Ambulatory Visit: Payer: Self-pay | Admitting: Physician Assistant

## 2013-11-11 ENCOUNTER — Other Ambulatory Visit: Payer: Self-pay | Admitting: Physician Assistant

## 2013-12-25 ENCOUNTER — Other Ambulatory Visit: Payer: Self-pay | Admitting: Physician Assistant

## 2014-02-17 ENCOUNTER — Other Ambulatory Visit: Payer: Self-pay | Admitting: Physician Assistant

## 2014-02-21 ENCOUNTER — Other Ambulatory Visit: Payer: Self-pay | Admitting: Physician Assistant

## 2014-03-10 ENCOUNTER — Ambulatory Visit (INDEPENDENT_AMBULATORY_CARE_PROVIDER_SITE_OTHER): Payer: BLUE CROSS/BLUE SHIELD | Admitting: Physician Assistant

## 2014-03-10 ENCOUNTER — Encounter: Payer: Self-pay | Admitting: Physician Assistant

## 2014-03-10 VITALS — BP 157/82 | HR 114 | Temp 98.6°F | Ht 75.0 in | Wt 269.0 lb

## 2014-03-10 DIAGNOSIS — E291 Testicular hypofunction: Secondary | ICD-10-CM

## 2014-03-10 DIAGNOSIS — E039 Hypothyroidism, unspecified: Secondary | ICD-10-CM

## 2014-03-10 DIAGNOSIS — R062 Wheezing: Secondary | ICD-10-CM

## 2014-03-10 DIAGNOSIS — R05 Cough: Secondary | ICD-10-CM

## 2014-03-10 DIAGNOSIS — J209 Acute bronchitis, unspecified: Secondary | ICD-10-CM

## 2014-03-10 DIAGNOSIS — K2100 Gastro-esophageal reflux disease with esophagitis, without bleeding: Secondary | ICD-10-CM

## 2014-03-10 DIAGNOSIS — R059 Cough, unspecified: Secondary | ICD-10-CM

## 2014-03-10 MED ORDER — AZITHROMYCIN 250 MG PO TABS: ORAL_TABLET | ORAL | Status: DC

## 2014-03-10 MED ORDER — IPRATROPIUM-ALBUTEROL 0.5-2.5 (3) MG/3ML IN SOLN: 3.0000 mL | RESPIRATORY_TRACT | Status: DC | PRN

## 2014-03-10 MED ORDER — PANTOPRAZOLE SODIUM 40 MG PO TBEC: 40.0000 mg | DELAYED_RELEASE_TABLET | Freq: Every day | ORAL | Status: DC

## 2014-03-10 MED ORDER — PREDNISONE 50 MG PO TABS: ORAL_TABLET | ORAL | Status: DC

## 2014-03-10 MED ORDER — HYDROCODONE-HOMATROPINE 5-1.5 MG/5ML PO SYRP: 5.0000 mL | ORAL_SOLUTION | Freq: Every evening | ORAL | Status: DC | PRN

## 2014-03-10 MED ORDER — IPRATROPIUM-ALBUTEROL 0.5-2.5 (3) MG/3ML IN SOLN
3.0000 mL | RESPIRATORY_TRACT | Status: DC
  Administered 2014-03-10: 3 mL via RESPIRATORY_TRACT

## 2014-03-10 NOTE — Patient Instructions (Addendum)
Delsym OTC for cough/honey  Acute Bronchitis Bronchitis is inflammation of the airways that extend from the windpipe into the lungs (bronchi). The inflammation often causes mucus to develop. This leads to a cough, which is the most common symptom of bronchitis.  In acute bronchitis, the condition usually develops suddenly and goes away over time, usually in a couple weeks. Smoking, allergies, and asthma can make bronchitis worse. Repeated episodes of bronchitis may cause further lung problems.  CAUSES Acute bronchitis is most often caused by the same virus that causes a cold. The virus can spread from person to person (contagious) through coughing, sneezing, and touching contaminated objects. SIGNS AND SYMPTOMS   Cough.   Fever.   Coughing up mucus.   Body aches.   Chest congestion.   Chills.   Shortness of breath.   Sore throat.  DIAGNOSIS  Acute bronchitis is usually diagnosed through a physical exam. Your health care provider will also ask you questions about your medical history. Tests, such as chest X-rays, are sometimes done to rule out other conditions.  TREATMENT  Acute bronchitis usually goes away in a couple weeks. Oftentimes, no medical treatment is necessary. Medicines are sometimes given for relief of fever or cough. Antibiotic medicines are usually not needed but may be prescribed in certain situations. In some cases, an inhaler may be recommended to help reduce shortness of breath and control the cough. A cool mist vaporizer may also be used to help thin bronchial secretions and make it easier to clear the chest.  HOME CARE INSTRUCTIONS  Get plenty of rest.   Drink enough fluids to keep your urine clear or pale yellow (unless you have a medical condition that requires fluid restriction). Increasing fluids may help thin your respiratory secretions (sputum) and reduce chest congestion, and it will prevent dehydration.   Take medicines only as directed by your  health care provider.  If you were prescribed an antibiotic medicine, finish it all even if you start to feel better.  Avoid smoking and secondhand smoke. Exposure to cigarette smoke or irritating chemicals will make bronchitis worse. If you are a smoker, consider using nicotine gum or skin patches to help control withdrawal symptoms. Quitting smoking will help your lungs heal faster.   Reduce the chances of another bout of acute bronchitis by washing your hands frequently, avoiding people with cold symptoms, and trying not to touch your hands to your mouth, nose, or eyes.   Keep all follow-up visits as directed by your health care provider.  SEEK MEDICAL CARE IF: Your symptoms do not improve after 1 week of treatment.  SEEK IMMEDIATE MEDICAL CARE IF:  You develop an increased fever or chills.   You have chest pain.   You have severe shortness of breath.  You have bloody sputum.   You develop dehydration.  You faint or repeatedly feel like you are going to pass out.  You develop repeated vomiting.  You develop a severe headache. MAKE SURE YOU:   Understand these instructions.  Will watch your condition.  Will get help right away if you are not doing well or get worse. Document Released: 03/10/2004 Document Revised: 06/17/2013 Document Reviewed: 07/24/2012 [facility] Patient Information 2015 [place], [place]. This information is not intended to replace advice given to you by your health care provider. Make sure you discuss any questions you have with your health care provider.

## 2014-03-10 NOTE — Progress Notes (Signed)
   Subjective:    Patient ID: Wesley Mckay, male    DOB: 01-01-1981, 34 y.o.   MRN: [id number]  HPI  Patient is a 34 year old male who presents to the clinic with acute illness as well as need for medication refills.  Patient started feeling bad approximately 6 days ago. He has ran a low-grade fever of 1 and 1. His been taking Tylenol regularly. His cough continues to worsen. It is very productive with times of sputum throughout the day. He describes the sputum is yellow to green. She's been using the [employer] nebulizer as needed which seems to help some. He has no history of lung disease or asthma. Is also having some head pressure and left-sided neck pain. He has been using Mucinex fast max with little relief. He feels like he has heard some wheezing especially when he lays down at night. He does feel some short of breath.   Hypogonadism-he takes his testosterone shot twice weekly at home. Needs refills. No problems or concerns today.  Hypothyroidism-he does admit to stopping his thyroid medicine for about a week because he did not get refill. His wife feel that him he has started back. He does need a refill today.  History of H. pylori infection- patient was diagnosed with H. pylori infection and treated back in never. She's never been checked for eradication.  GERD-he would like something else besides Prilosec. He feels like his acid reflux is worsening. He's had acid reflux problems since he was in sixth grade. He has always been on suppression therapy. He denies any significant abdominal pain, black tarry stools.     Review of Systems  All other systems reviewed and are negative.      Objective:   Physical Exam  Constitutional: He is oriented to person, place, and time. He appears well-developed and well-nourished.  HENT:  Head: Normocephalic and atraumatic.  Right Ear: External ear normal.  Left Ear: External ear normal.  Nose: Nose normal.  Mouth/Throat: Oropharynx  is clear and moist. No oropharyngeal exudate.  Eyes: Conjunctivae are normal. Right eye exhibits no discharge. Left eye exhibits no discharge.  Neck: Normal range of motion. Neck supple.  Cardiovascular: Regular rhythm and normal heart sounds.   Tachycardia at 114.  Pulmonary/Chest:  Pulse ox is 92%.  Patient is unable to get a full deep breath due to cough. I do hear bilateral rhonchi and some wheezing. Do not hear any crackles.  Lymphadenopathy:    He has no cervical adenopathy.  Neurological: He is alert and oriented to person, place, and time.  Skin: Skin is dry.  Psychiatric: He has a normal mood and affect. His behavior is normal.          Assessment & Plan:  Hypogonadism- will recheck testosterone levels today with PSA, CBC.   hypothyroidisim- will recheck levels today and make changes as needed.   GERD- switched prilosec to protonix daily. Discussed if symptoms not improving need to consider endoscopy due to long hx of GERD.   Acute bronchitis/wheezing- DuoNeb given in office today with some improvement. Treated with zpak and prednisone for 5 days. Patient does request cough syrup for bedtime. Hycodan was given. Expressed only to use at bedtime. Encourage patient to cough during the day. If not improving or worsening we need to get a chest x-ray in the next couple days.

## 2014-03-27 ENCOUNTER — Telehealth: Payer: Self-pay | Admitting: Physician Assistant

## 2014-03-27 NOTE — Telephone Encounter (Signed)
Patient walked in and need a refill for Klonopin and he wants Luvenia Starch to know the protonix is causing diarrhea should he stop taking it or should he continue and eventually it will go away. Pt req a call back. Thanks

## 2014-03-28 ENCOUNTER — Other Ambulatory Visit: Payer: Self-pay | Admitting: *Deleted

## 2014-03-28 DIAGNOSIS — E291 Testicular hypofunction: Secondary | ICD-10-CM

## 2014-03-28 LAB — CBC WITH DIFFERENTIAL/PLATELET
Basophils Absolute: 0 10*3/uL (ref 0.0–0.1)
Basophils Relative: 0 % (ref 0–1)
Eosinophils Absolute: 0.2 10*3/uL (ref 0.0–0.7)
Eosinophils Relative: 2 % (ref 0–5)
HEMATOCRIT: 42.9 % (ref 39.0–52.0)
HEMOGLOBIN: 14.8 g/dL (ref 13.0–17.0)
LYMPHS ABS: 2.1 10*3/uL (ref 0.7–4.0)
Lymphocytes Relative: 26 % (ref 12–46)
MCH: 29.6 pg (ref 26.0–34.0)
MCHC: 34.5 g/dL (ref 30.0–36.0)
MCV: 85.8 fL (ref 78.0–100.0)
MONOS PCT: 5 % (ref 3–12)
MPV: 9.7 fL (ref 8.6–12.4)
Monocytes Absolute: 0.4 10*3/uL (ref 0.1–1.0)
Neutro Abs: 5.4 10*3/uL (ref 1.7–7.7)
Neutrophils Relative %: 67 % (ref 43–77)
Platelets: 279 10*3/uL (ref 150–400)
RBC: 5 MIL/uL (ref 4.22–5.81)
RDW: 14.2 % (ref 11.5–15.5)
WBC: 8 10*3/uL (ref 4.0–10.5)

## 2014-03-28 LAB — TESTOSTERONE, FREE, TOTAL, SHBG
SEX HORMONE BINDING: 22 nmol/L (ref 10–50)
TESTOSTERONE FREE: 148.8 pg/mL (ref 47.0–244.0)
TESTOSTERONE-% FREE: 2.6 % (ref 1.6–2.9)
TESTOSTERONE: 565 ng/dL (ref 300–890)

## 2014-03-28 LAB — COMPLETE METABOLIC PANEL WITH GFR
ALT: 17 U/L (ref 0–53)
AST: 16 U/L (ref 0–37)
Albumin: 4.2 g/dL (ref 3.5–5.2)
Alkaline Phosphatase: 78 U/L (ref 39–117)
BUN: 7 mg/dL (ref 6–23)
CO2: 23 meq/L (ref 19–32)
CREATININE: 0.93 mg/dL (ref 0.50–1.35)
Calcium: 9.6 mg/dL (ref 8.4–10.5)
Chloride: 105 mEq/L (ref 96–112)
GLUCOSE: 98 mg/dL (ref 70–99)
Potassium: 4 mEq/L (ref 3.5–5.3)
SODIUM: 139 meq/L (ref 135–145)
TOTAL PROTEIN: 6.6 g/dL (ref 6.0–8.3)
Total Bilirubin: 0.7 mg/dL (ref 0.2–1.2)

## 2014-03-28 LAB — PSA, TOTAL AND FREE
PSA FREE: 0.25 ng/mL
PSA, Free Pct: 29 % (ref >25)
PSA: 0.86 ng/mL (ref <=4.00)

## 2014-03-28 LAB — TSH: TSH: 0.353 u[IU]/mL (ref 0.350–4.500)

## 2014-03-28 LAB — T4, FREE: Free T4: 1.78 ng/dL (ref 0.80–1.80)

## 2014-03-28 MED ORDER — LEVOTHYROXINE SODIUM 200 MCG PO TABS: ORAL_TABLET | ORAL | Status: DC

## 2014-03-28 MED ORDER — CLONAZEPAM 0.5 MG PO TABS: 0.5000 mg | ORAL_TABLET | Freq: Every evening | ORAL | Status: DC | PRN

## 2014-03-28 MED ORDER — LEVOTHYROXINE SODIUM 50 MCG PO TABS: ORAL_TABLET | ORAL | Status: DC

## 2014-03-28 MED ORDER — TESTOSTERONE CYPIONATE 200 MG/ML IM SOLN: 200.0000 mg | INTRAMUSCULAR | Status: DC

## 2014-03-28 NOTE — Telephone Encounter (Signed)
Are you okay with refilling his clonazepam? It's been a long time since it was written.

## 2014-03-28 NOTE — Telephone Encounter (Signed)
Stop taking for now and see if diarrhea does resolve. If does will send over another PPI if still having acid reflux.

## 2014-03-28 NOTE — Telephone Encounter (Signed)
Pt.notified

## 2014-03-28 NOTE — Telephone Encounter (Signed)
Yes for 30 tabs as needed. If taking regularly needs appt.

## 2014-04-22 ENCOUNTER — Other Ambulatory Visit: Payer: Self-pay | Admitting: Physician Assistant

## 2014-07-17 ENCOUNTER — Other Ambulatory Visit: Payer: Self-pay | Admitting: Physician Assistant

## 2014-09-08 ENCOUNTER — Other Ambulatory Visit: Payer: Self-pay | Admitting: Physician Assistant

## 2014-09-08 MED ORDER — LEVOTHYROXINE SODIUM 200 MCG PO TABS: 200.0000 ug | ORAL_TABLET | Freq: Every day | ORAL | Status: DC

## 2014-09-08 MED ORDER — LEVOTHYROXINE SODIUM 50 MCG PO TABS: ORAL_TABLET | ORAL | Status: DC

## 2014-10-06 ENCOUNTER — Other Ambulatory Visit: Payer: Self-pay | Admitting: Physician Assistant

## 2014-10-08 NOTE — Telephone Encounter (Signed)
Called and spoke with Pt, was able to schedule a f/u appt.

## 2014-11-04 ENCOUNTER — Encounter: Payer: Self-pay | Admitting: Family Medicine

## 2014-11-04 ENCOUNTER — Ambulatory Visit (INDEPENDENT_AMBULATORY_CARE_PROVIDER_SITE_OTHER): Payer: Self-pay | Admitting: Family Medicine

## 2014-11-04 DIAGNOSIS — Z0289 Encounter for other administrative examinations: Secondary | ICD-10-CM

## 2014-11-04 NOTE — Progress Notes (Signed)
No show 11/04/2014

## 2014-11-14 ENCOUNTER — Ambulatory Visit (INDEPENDENT_AMBULATORY_CARE_PROVIDER_SITE_OTHER): Payer: Self-pay | Admitting: Physician Assistant

## 2014-11-14 ENCOUNTER — Encounter: Payer: Self-pay | Admitting: Physician Assistant

## 2014-11-14 VITALS — BP 116/77 | HR 62 | Ht 75.0 in | Wt 265.0 lb

## 2014-11-14 DIAGNOSIS — Z79899 Other long term (current) drug therapy: Secondary | ICD-10-CM

## 2014-11-14 DIAGNOSIS — R5383 Other fatigue: Secondary | ICD-10-CM

## 2014-11-14 DIAGNOSIS — R41840 Attention and concentration deficit: Secondary | ICD-10-CM

## 2014-11-14 DIAGNOSIS — Z13220 Encounter for screening for lipoid disorders: Secondary | ICD-10-CM

## 2014-11-14 DIAGNOSIS — M722 Plantar fascial fibromatosis: Secondary | ICD-10-CM | POA: Insufficient documentation

## 2014-11-14 DIAGNOSIS — E291 Testicular hypofunction: Secondary | ICD-10-CM

## 2014-11-14 MED ORDER — TESTOSTERONE CYPIONATE 200 MG/ML IM SOLN
200.0000 mg | Freq: Once | INTRAMUSCULAR | Status: AC
  Administered 2014-11-14: 200 mg via INTRAMUSCULAR

## 2014-11-14 MED ORDER — TESTOSTERONE CYPIONATE 200 MG/ML IM SOLN: 200.0000 mg | INTRAMUSCULAR | Status: DC

## 2014-11-14 MED ORDER — LEVOTHYROXINE SODIUM 50 MCG PO TABS: ORAL_TABLET | ORAL | Status: DC

## 2014-11-14 MED ORDER — CLONAZEPAM 0.5 MG PO TABS: 0.5000 mg | ORAL_TABLET | Freq: Every evening | ORAL | Status: DC | PRN

## 2014-11-14 MED ORDER — LEVOTHYROXINE SODIUM 200 MCG PO TABS: ORAL_TABLET | ORAL | Status: DC

## 2014-11-14 MED ORDER — SILDENAFIL CITRATE 20 MG PO TABS: ORAL_TABLET | ORAL | Status: DC

## 2014-11-14 NOTE — Patient Instructions (Addendum)
Strattera- call for free starter pack Dr. Georgina Snell for foot inserts.

## 2014-11-14 NOTE — Progress Notes (Signed)
   Subjective:    Patient ID: Wesley Mckay, male    DOB: 1980/10/03, 34 y.o.   MRN: [id number]  HPI Pt presents to the clinic for follow up.   Hypogonadism- needs levels checked. Taking shot every 2 weeks. Thought it would help more with energy. It really has not.   Hypothyroidism- needs level checked. Taking synthroid daily.   ED- doing ok with medication.   He is concerned about his focus. Dx as a child with ADHD never treated medically. He was recently fired and employer said he "wasn't as focused and others could get the job done faster". He feels like it is difficult to complete task. Reports hard to listen and understand what is being asked. Making a lot of mistakes.   Bilateral plantar fascittis- doing fairly well today. Wonders if he could get any more foot inserts.      Review of Systems  All other systems reviewed and are negative.      Objective:   Physical Exam  Constitutional: He is oriented to person, place, and time. He appears well-developed and well-nourished.  HENT:  Head: Normocephalic and atraumatic.  Cardiovascular: Normal rate, regular rhythm and normal heart sounds.   Pulmonary/Chest: Effort normal and breath sounds normal.  Neurological: He is alert and oriented to person, place, and time.  Skin: Skin is dry.  Psychiatric: He has a normal mood and affect. His behavior is normal.          Assessment & Plan:  Inattention- hx of ADHD as a child. 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 ADD 10/18. Will refer for testing. We can try strattera if pt would like. I think this would be a great option since he is stimulant naive. He would like to discuss with wife first. Give me a call if would like to try non-stimulant first.   Anxiety- klonapin as needed. Not taking daily.   Hypogonadism- recheck testosterone, PSA, cbc.   Hypothyroidism- will recheck today and adjust accordingly.   No energy- b12, vitamin D, TSH, CBC. Could  be a combination of hypothyroidism, hypogonadism and inattention.   Bilateral plantar fascitits- Dr. Georgina Snell or Dr. Darene Lamer for orthotics.

## 2014-11-17 ENCOUNTER — Other Ambulatory Visit: Payer: Self-pay | Admitting: *Deleted

## 2014-11-17 ENCOUNTER — Telehealth: Payer: Self-pay | Admitting: Physician Assistant

## 2014-11-17 DIAGNOSIS — E291 Testicular hypofunction: Secondary | ICD-10-CM

## 2014-11-17 DIAGNOSIS — R41840 Attention and concentration deficit: Secondary | ICD-10-CM | POA: Insufficient documentation

## 2014-11-17 DIAGNOSIS — R5383 Other fatigue: Secondary | ICD-10-CM | POA: Insufficient documentation

## 2014-11-17 LAB — CBC WITH DIFFERENTIAL/PLATELET
BASOS ABS: 0.1 10*3/uL (ref 0.0–0.1)
BASOS PCT: 1 % (ref 0–1)
EOS ABS: 0.2 10*3/uL (ref 0.0–0.7)
EOS PCT: 4 % (ref 0–5)
HCT: 43.8 % (ref 39.0–52.0)
Hemoglobin: 15.1 g/dL (ref 13.0–17.0)
LYMPHS ABS: 1.8 10*3/uL (ref 0.7–4.0)
Lymphocytes Relative: 30 % (ref 12–46)
MCH: 29.5 pg (ref 26.0–34.0)
MCHC: 34.5 g/dL (ref 30.0–36.0)
MCV: 85.5 fL (ref 78.0–100.0)
MPV: 9.9 fL (ref 8.6–12.4)
Monocytes Absolute: 0.4 10*3/uL (ref 0.1–1.0)
Monocytes Relative: 7 % (ref 3–12)
Neutro Abs: 3.5 10*3/uL (ref 1.7–7.7)
Neutrophils Relative %: 58 % (ref 43–77)
PLATELETS: 230 10*3/uL (ref 150–400)
RBC: 5.12 MIL/uL (ref 4.22–5.81)
RDW: 13.4 % (ref 11.5–15.5)
WBC: 6 10*3/uL (ref 4.0–10.5)

## 2014-11-17 MED ORDER — LEVOTHYROXINE SODIUM 50 MCG PO TABS: ORAL_TABLET | ORAL | Status: DC

## 2014-11-17 MED ORDER — LEVOTHYROXINE SODIUM 200 MCG PO TABS: ORAL_TABLET | ORAL | Status: DC

## 2014-11-17 MED ORDER — SILDENAFIL CITRATE 20 MG PO TABS: ORAL_TABLET | ORAL | Status: DC

## 2014-11-17 MED ORDER — TESTOSTERONE CYPIONATE 200 MG/ML IM SOLN: 200.0000 mg | INTRAMUSCULAR | Status: DC

## 2014-11-17 NOTE — Telephone Encounter (Signed)
Pt called and said did not want to try strattera due to sexual side effects. Will wait on testing for ADHD and consider stimulant.

## 2014-11-18 ENCOUNTER — Encounter: Payer: Self-pay | Admitting: Physician Assistant

## 2014-11-18 DIAGNOSIS — E785 Hyperlipidemia, unspecified: Secondary | ICD-10-CM | POA: Insufficient documentation

## 2014-11-18 LAB — COMPLETE METABOLIC PANEL WITH GFR
ALT: 18 U/L (ref 9–46)
AST: 14 U/L (ref 10–40)
Albumin: 4.4 g/dL (ref 3.6–5.1)
Alkaline Phosphatase: 85 U/L (ref 40–115)
BILIRUBIN TOTAL: 0.6 mg/dL (ref 0.2–1.2)
BUN: 9 mg/dL (ref 7–25)
CHLORIDE: 104 mmol/L (ref 98–110)
CO2: 27 mmol/L (ref 20–31)
Calcium: 9.3 mg/dL (ref 8.6–10.3)
Creat: 0.96 mg/dL (ref 0.60–1.35)
GLUCOSE: 92 mg/dL (ref 65–99)
POTASSIUM: 4.1 mmol/L (ref 3.5–5.3)
SODIUM: 140 mmol/L (ref 135–146)
Total Protein: 6.6 g/dL (ref 6.1–8.1)

## 2014-11-18 LAB — VITAMIN D 25 HYDROXY (VIT D DEFICIENCY, FRACTURES): Vit D, 25-Hydroxy: 36 ng/mL (ref 30–100)

## 2014-11-18 LAB — TSH: TSH: 1.581 u[IU]/mL (ref 0.350–4.500)

## 2014-11-18 LAB — LIPID PANEL
CHOL/HDL RATIO: 4.9 ratio (ref <=5.0)
Cholesterol: 183 mg/dL (ref 125–200)
HDL: 37 mg/dL — AB (ref >=40)
LDL CALC: 135 mg/dL — AB (ref <130)
TRIGLYCERIDES: 55 mg/dL (ref <150)
VLDL: 11 mg/dL (ref <30)

## 2014-11-18 LAB — PSA, TOTAL AND FREE
PSA FREE PCT: 37 % (ref >25)
PSA FREE: 0.19 ng/mL
PSA: 0.52 ng/mL (ref <=4.00)

## 2014-11-18 LAB — TESTOSTERONE, FREE, TOTAL, SHBG
Sex Hormone Binding: 21 nmol/L (ref 10–50)
TESTOSTERONE-% FREE: 2.8 % (ref 1.6–2.9)
Testosterone, Free: 184.1 pg/mL (ref 47.0–244.0)
Testosterone: 668 ng/dL (ref 300–890)

## 2014-11-18 LAB — VITAMIN B12: VITAMIN B 12: 360 pg/mL (ref 211–911)

## 2014-11-20 ENCOUNTER — Encounter: Payer: Self-pay | Admitting: Family Medicine

## 2014-12-08 ENCOUNTER — Ambulatory Visit (INDEPENDENT_AMBULATORY_CARE_PROVIDER_SITE_OTHER): Payer: BLUE CROSS/BLUE SHIELD | Admitting: Psychology

## 2014-12-08 DIAGNOSIS — F900 Attention-deficit hyperactivity disorder, predominantly inattentive type: Secondary | ICD-10-CM

## 2014-12-19 ENCOUNTER — Ambulatory Visit (INDEPENDENT_AMBULATORY_CARE_PROVIDER_SITE_OTHER): Payer: BLUE CROSS/BLUE SHIELD | Admitting: Psychology

## 2014-12-19 ENCOUNTER — Ambulatory Visit (INDEPENDENT_AMBULATORY_CARE_PROVIDER_SITE_OTHER): Payer: Self-pay | Admitting: Physician Assistant

## 2014-12-19 ENCOUNTER — Encounter: Payer: Self-pay | Admitting: Physician Assistant

## 2014-12-19 VITALS — BP 132/87 | HR 76 | Ht 75.0 in | Wt 274.0 lb

## 2014-12-19 DIAGNOSIS — F900 Attention-deficit hyperactivity disorder, predominantly inattentive type: Secondary | ICD-10-CM

## 2014-12-19 DIAGNOSIS — F330 Major depressive disorder, recurrent, mild: Secondary | ICD-10-CM

## 2014-12-19 DIAGNOSIS — F331 Major depressive disorder, recurrent, moderate: Secondary | ICD-10-CM

## 2014-12-19 MED ORDER — AMPHETAMINE-DEXTROAMPHET ER 20 MG PO CP24: 20.0000 mg | ORAL_CAPSULE | ORAL | Status: DC

## 2014-12-21 NOTE — Progress Notes (Signed)
   Subjective:    Patient ID: Wesley Mckay, male    DOB: [birth year], 34 y.o.   MRN: [id number]  HPI  Pt presents to the clinic to follow up after evaluation for ADHD.   Pt brings in formal report that was positive for ADHD and MDD.   Review of Systems  All other systems reviewed and are negative.      Objective:   Physical Exam  Constitutional: He appears well-developed and well-nourished.  Cardiovascular: Normal rate, regular rhythm and normal heart sounds.   Pulmonary/Chest: Effort normal and breath sounds normal.  Psychiatric: He has a normal mood and affect. His behavior is normal.          Assessment & Plan:  ADHD- discussed options. Pt would like to start Adderall extended release. Side effects discussed. Pt is very concerned about any sexual dysfunction. Follow up in 1-2 months.   MDD- will consider counseling but declines any medication today. Denies any suicidal or homicidal thoughts.

## 2015-01-11 ENCOUNTER — Other Ambulatory Visit: Payer: Self-pay | Admitting: Physician Assistant

## 2015-01-11 DIAGNOSIS — E291 Testicular hypofunction: Secondary | ICD-10-CM

## 2015-01-13 MED ORDER — AMBULATORY NON FORMULARY MEDICATION: Status: DC

## 2015-01-13 MED ORDER — TESTOSTERONE CYPIONATE 200 MG/ML IM SOLN: 200.0000 mg | INTRAMUSCULAR | Status: DC

## 2015-01-13 NOTE — Addendum Note (Signed)
Addended by: Doree Albee on: 01/13/2015 02:35 PM   Modules accepted: Orders

## 2015-01-14 ENCOUNTER — Other Ambulatory Visit: Payer: Self-pay | Admitting: Physician Assistant

## 2015-01-14 ENCOUNTER — Encounter: Payer: Self-pay | Admitting: Physician Assistant

## 2015-01-14 MED ORDER — AMPHETAMINE-DEXTROAMPHETAMINE 10 MG PO TABS: 10.0000 mg | ORAL_TABLET | Freq: Every day | ORAL | Status: DC

## 2015-01-14 MED ORDER — AMPHETAMINE-DEXTROAMPHET ER 20 MG PO CP24: 20.0000 mg | ORAL_CAPSULE | ORAL | Status: DC

## 2015-01-14 NOTE — Addendum Note (Signed)
Addended by: Bo Mcclintock C on: 01/14/2015 09:30 AM   Modules accepted: Orders

## 2015-02-10 ENCOUNTER — Other Ambulatory Visit: Payer: Self-pay | Admitting: Physician Assistant

## 2015-02-10 MED ORDER — AMPHETAMINE-DEXTROAMPHETAMINE 10 MG PO TABS: 10.0000 mg | ORAL_TABLET | Freq: Every day | ORAL | Status: DC

## 2015-02-21 ENCOUNTER — Encounter: Payer: Self-pay | Admitting: Physician Assistant

## 2015-02-23 ENCOUNTER — Other Ambulatory Visit: Payer: Self-pay | Admitting: Physician Assistant

## 2015-02-23 MED ORDER — LISDEXAMFETAMINE DIMESYLATE 40 MG PO CAPS: 40.0000 mg | ORAL_CAPSULE | ORAL | Status: DC

## 2015-02-24 ENCOUNTER — Other Ambulatory Visit: Payer: Self-pay | Admitting: *Deleted

## 2015-02-24 MED ORDER — SILDENAFIL CITRATE 20 MG PO TABS: ORAL_TABLET | ORAL | Status: DC

## 2015-03-23 ENCOUNTER — Other Ambulatory Visit: Payer: Self-pay | Admitting: Physician Assistant

## 2015-03-25 ENCOUNTER — Other Ambulatory Visit: Payer: Self-pay

## 2015-03-27 ENCOUNTER — Encounter: Payer: Self-pay | Admitting: Physician Assistant

## 2015-03-27 ENCOUNTER — Ambulatory Visit (INDEPENDENT_AMBULATORY_CARE_PROVIDER_SITE_OTHER): Payer: 59 | Admitting: Physician Assistant

## 2015-03-27 VITALS — BP 137/78 | HR 57 | Ht 75.0 in | Wt 257.0 lb

## 2015-03-27 DIAGNOSIS — F900 Attention-deficit hyperactivity disorder, predominantly inattentive type: Secondary | ICD-10-CM | POA: Diagnosis not present

## 2015-03-27 MED ORDER — LISDEXAMFETAMINE DIMESYLATE 50 MG PO CAPS: 50.0000 mg | ORAL_CAPSULE | Freq: Every day | ORAL | Status: DC

## 2015-03-27 NOTE — Progress Notes (Addendum)
   Subjective:    Patient ID: Wesley Mckay, male    DOB: [birth year], 35 y.o.   MRN: [id number]  HPI  Patient is a 35 year old male that is presenting for medication follow up for ADHD. Patient has been tolerating vyvance (40mg ) well. However, patient states that he feels as though this medication wanes in intensity after 8.5 hours and he works 12 hour shift. He states that he would like to try increasing the dose of vyvance to extend duration of treatment. Patient denies appetite suppression, sleep disturbance and palpitations. Please HPI.   Review of Systems Please see HPI     Objective:   Physical Exam  Constitutional: He is oriented to person, place, and time. He appears well-developed and well-nourished.  HENT:  Head: Normocephalic and atraumatic.  Right Ear: External ear normal.  Left Ear: External ear normal.  Nose: Nose normal.  Mouth/Throat: Oropharynx is clear and moist.  Eyes: Conjunctivae and EOM are normal. Pupils are equal, round, and reactive to light.  Neck: Normal range of motion. Neck supple. No thyromegaly present.  Cardiovascular: Normal rate, regular rhythm, normal heart sounds and intact distal pulses.  Exam reveals no gallop and no friction rub.   No murmur heard. Pulmonary/Chest: Breath sounds normal. No respiratory distress. He has no wheezes. He has no rales.  Musculoskeletal: Normal range of motion.  Neurological: He is alert and oriented to person, place, and time. He has normal reflexes.  Psychiatric: He has a normal mood and affect. His behavior is normal. Judgment and thought content normal.          Assessment & Plan:  1. ADHD Patient's vyvance was increased to 50 mg per day. Follow up in 3 months.

## 2015-05-10 ENCOUNTER — Encounter: Payer: Self-pay | Admitting: Physician Assistant

## 2015-05-25 ENCOUNTER — Ambulatory Visit: Payer: Self-pay | Admitting: Physician Assistant

## 2015-05-26 ENCOUNTER — Ambulatory Visit: Payer: Self-pay | Admitting: Physician Assistant

## 2015-05-27 ENCOUNTER — Ambulatory Visit (INDEPENDENT_AMBULATORY_CARE_PROVIDER_SITE_OTHER): Payer: 59 | Admitting: Physician Assistant

## 2015-05-27 ENCOUNTER — Encounter: Payer: Self-pay | Admitting: Physician Assistant

## 2015-05-27 VITALS — BP 121/71 | HR 72 | Ht 75.0 in | Wt 250.0 lb

## 2015-05-27 DIAGNOSIS — E039 Hypothyroidism, unspecified: Secondary | ICD-10-CM | POA: Diagnosis not present

## 2015-05-27 DIAGNOSIS — F900 Attention-deficit hyperactivity disorder, predominantly inattentive type: Secondary | ICD-10-CM | POA: Diagnosis not present

## 2015-05-27 DIAGNOSIS — E291 Testicular hypofunction: Secondary | ICD-10-CM

## 2015-05-27 MED ORDER — LEVOTHYROXINE SODIUM 50 MCG PO TABS: ORAL_TABLET | ORAL | Status: DC

## 2015-05-27 MED ORDER — TESTOSTERONE CYPIONATE 200 MG/ML IM SOLN: 200.0000 mg | INTRAMUSCULAR | Status: DC

## 2015-05-27 MED ORDER — LISDEXAMFETAMINE DIMESYLATE 50 MG PO CAPS: 50.0000 mg | ORAL_CAPSULE | Freq: Every day | ORAL | Status: DC

## 2015-05-27 MED ORDER — TESTOSTERONE CYPIONATE 200 MG/ML IM SOLN
200.0000 mg | Freq: Once | INTRAMUSCULAR | Status: AC
  Administered 2015-05-27: 200 mg via INTRAMUSCULAR

## 2015-05-27 MED ORDER — LEVOTHYROXINE SODIUM 200 MCG PO TABS: ORAL_TABLET | ORAL | Status: DC

## 2015-05-27 NOTE — Progress Notes (Signed)
   Subjective:    Patient ID: Wesley Mckay, male    DOB: 06-13-1980, 35 y.o.   MRN: [id number]  HPI Pt presents to the clinic for medication refills before running out of insurance for the next 3 months. He has no complaints or concerns. Doing well on all medications.    Review of Systems  All other systems reviewed and are negative.      Objective:   Physical Exam  Constitutional: He is oriented to person, place, and time. He appears well-developed and well-nourished.  HENT:  Head: Normocephalic and atraumatic.  Neck: Normal range of motion. Neck supple.  Cardiovascular: Normal rate, regular rhythm and normal heart sounds.   Pulmonary/Chest: Effort normal and breath sounds normal. He has no wheezes.  Lymphadenopathy:    He has no cervical adenopathy.  Neurological: He is alert and oriented to person, place, and time.  Psychiatric: He has a normal mood and affect. His behavior is normal.          Assessment & Plan:  Male hypogonadism- testosterone shot given in office today. Refilled personal supply to continue every 2 weeks shots.   ADHD- refilled vyvanse for 3 months. Given good rx card to see while he has no insurance if cheaper.   Hypothyroidism- 90 rx given. Will recheck TSH in 3 months after getting insurance back.

## 2015-07-11 ENCOUNTER — Encounter: Payer: Self-pay | Admitting: Emergency Medicine

## 2015-07-11 ENCOUNTER — Emergency Department (INDEPENDENT_AMBULATORY_CARE_PROVIDER_SITE_OTHER)
Admission: EM | Admit: 2015-07-11 | Discharge: 2015-07-11 | Disposition: A | Discharge status: Home / Self Care | Payer: 59 | Source: Home / Self Care | Attending: Family Medicine | Admitting: Family Medicine

## 2015-07-11 DIAGNOSIS — Z7721 Contact with and (suspected) exposure to potentially hazardous body fluids: Secondary | ICD-10-CM

## 2015-07-11 NOTE — Discharge Instructions (Signed)
Body Fluid Exposure Information  People may come into contact with blood and other body fluids under various circumstances. In some cases, body fluids may contain germs (bacteria or viruses) that cause infections. These germs can be spread when another person's body fluids come into contact with your skin, mouth, eyes, or genitals.   Exposure to body fluids that may contain infectious material is a common problem for people providing care for others who are ill. It can occur when a person is performing health care tasks in the workplace or when taking care of a family member at home. Other common methods of exposure include injection drug use, sharing needles, and sexual activity.  The risk of an infection spreading through body fluid exposure is small and depends on a variety of factors. This includes the type of body fluid, the nature of the exposure, and the health status of the person who was the source of the body fluids. Your health care provider can help you assess the risk.  WHAT TYPES OF BODY FLUID CAN SPREAD INFECTION?  The following types of body fluid have the potential to spread infections:   Blood.   Semen.   Vaginal secretions.   Urine.   Feces.   Saliva.   Nasal or eye discharge.   Breast milk.   Amniotic fluid and fluids surrounding body organs.  WHAT ARE SOME FIRST-AID MEASURES FOR BODY FLUID EXPOSURE?  The following steps should be taken as soon as possible after a person is exposed to body fluids:  Intact Skin   For contact with closed skin, wash the area with soap and water.  Broken Skin   For contact with broken skin (a wound), wash the area with soap and water. Let the area bleed a little. Then place a bandage or clean towel on the wound, applying gentle pressure to stop the bleeding. Do not squeeze or rub the area.   Use just water or hand sanitizer if a sink with soap is not available.   Do not use harsh chemicals such as bleach or iodine.  Eyes   Rinse the eyes with water or  saline for 30 seconds.   If the person is wearing contact lenses, leave the contact lenses in while rinsing the eyes. Once the rinsing is complete, remove the contact lenses.  Mouth   Spit out the fluids. Rinse and spit with water 4-5 times.  In addition, you should remove any clothing that comes into contact with body fluids. However, if body fluid exposure results from sexual assault, seek medical care immediately without changing clothes or bathing.  WHEN SHOULD YOU SEEK HELP?  After performing the proper first-aid steps, you should contact your health care provider or seek emergency care right away if blood or other body fluids made contact with areas of broken skin or openings such as the eyes or mouth. If the exposure to body fluid happened in the workplace, you should report it to your work supervisor immediately. Many workplaces have procedures in place for exposure situations.  WHAT WILL HAPPEN AFTER YOU REPORT THE EXPOSURE?  Your health care provider will ask you several questions. Information requested may include:   Your medical history, including vaccination records.   Date and time of the exposure.   Whether you saw body fluids during the exposure.   Type of body fluid you were exposed to.   Volume of body fluid you were exposed to.   How the exposure happened.   If any devices,   such as a needle, were being used.   Which area of your body made contact with the body fluid.   Description of any injury to the skin or other area.   How long contact was made with the body fluid.   Any information you have about the health status of the person whose body fluid you were exposed to.  The health care provider will assess your risk of infection. Often, no treatment is necessary. In some cases, the health care provider may recommend doing blood tests right away. Follow-up blood tests may also be done at certain intervals during the upcoming weeks and months to check for changes. You may be offered  treatment to prevent an infection from developing after exposure (post-exposure prophylaxis). This may include certain vaccinations or medicines and may be necessary when there is a risk of a serious infection, such as HIV or hepatitis B. Your health care provider should discuss appropriate treatment and vaccinations with you.  HOW CAN YOU PREVENT EXPOSURE AND INFECTION?  Always remember that prevention is the first line of defense against body fluid exposure. To help prevent exposure to body fluids:   Wash and disinfect countertops and other surfaces regularly.   Wear appropriate protective gear such as gloves, gowns, or eyewear when the possibility of exposure is present.   Wipe away spills of body fluid with disposable towels.   Properly dispose of blood products and other fluids. Use secured bags.   Properly dispose of needles and other instruments with sharp points or edges (sharps). Use closed, marked containers.   Avoid injection drug use.   Do not share needles.   Avoid recapping needles.   Use a condom during sexual intercourse.   Make sure you learn and follow any guidelines for preventing exposure (universal precautions) provided at your workplace.  To help reduce your chances of getting an infection:   Make sure your vaccinations are up-to-date, including those for tetanus and hepatitis.   Wash your hands frequently with soap and water. Use hand sanitizers.   Avoid having multiple sex partners.   Follow up with your health care provider as directed after being evaluated for an exposure to body fluids.  To avoid spreading infection to others:   Do not have sexual relations until you know you are free of infection.   Do not donate blood, plasma, breast milk, sperm, or other body fluids.   Do not share hygiene tools such as toothbrushes, razors, or dental floss.   Keep open wounds covered.   Dispose of any items with blood on them (razors, tampons, bandages) by putting them in the  trash.   Do not share drug supplies with others, such as needles, syringes, straws, or pipes.   Follow all of your health care provider's instructions for preventing the spread of infection.     This information is not intended to replace advice given to you by your health care provider. Make sure you discuss any questions you have with your health care provider.     Document Released: 10/03/2012 Document Revised: 02/05/2013 Document Reviewed: 10/03/2012  [employer] Interactive Patient Education 2016 [employer]

## 2015-07-11 NOTE — ED Notes (Signed)
Pt c/o a coworkers blood got on his fresh tattoo last night.  The tattoo is less than a week old, does not know any history of coworker, did not report incident at work.

## 2015-07-11 NOTE — ED Provider Notes (Signed)
CSN: [id number]     Arrival date & time 07/11/15  [id number] History   First MD Initiated Contact with Patient 07/11/15 1040     Chief Complaint  Patient presents with  . Body Fluid Exposure      HPI Comments: Patient states that a coworker experienced a "paper cut" last night, from which a drop of blood fell on patient's new tattoo on left arm.  He notes that his tattoo was placed one week ago and skin has healed.    The history is provided by the patient.    Past Medical History  Diagnosis Date  . Seizures ([place]) 03/2007  . Thyroid disease     hypothyroidism  . Obesity   . History of dislocation of shoulder   . Insomnia   . GERD (gastroesophageal reflux disease)    Past Surgical History  Procedure Laterality Date  . Shoulder surgery      Dr. Angelene Giovanni   Family History  Problem Relation Age of Onset  . Thyroid disease Maternal Grandmother   . Diabetes Father    Social History  Substance Use Topics  . Smoking status: Former Smoker -- 1.00 packs/day for 15 years    Types: Cigarettes  . Smokeless tobacco: None  . Alcohol Use: 1.0 oz/week    2 drink(s) per week    Review of Systems  Constitutional: Negative for fever, chills, diaphoresis, activity change, appetite change and fatigue.  HENT: Negative.   Eyes: Negative.   Respiratory: Negative.   Cardiovascular: Negative.   Gastrointestinal: Negative.   Genitourinary: Negative.   Musculoskeletal: Negative.   Skin: Negative.   Neurological: Negative for headaches.    Allergies  Sulfa antibiotics  Home Medications   Prior to Admission medications   Medication Sig Start Date End Date Taking? Authorizing Provider  AMBULATORY NON FORMULARY MEDICATION 3 ml syringe use every 2 weeks for testosterone injection  #100  22 1 1/2 gauge needle use every 2 weeks for testosterone injection  #100 01/13/15   Jade L Breeback, PA-C  clonazePAM (KLONOPIN) 0.5 MG tablet Take 1 tablet (0.5 mg total) by mouth at bedtime as needed. 11/14/14    Jade L Breeback, PA-C  levothyroxine (SYNTHROID, LEVOTHROID) 200 MCG tablet TAKE 1 TABLET (200 MCG TOTAL) BY MOUTH DAILY. 05/27/15   Jade L Breeback, PA-C  levothyroxine (SYNTHROID, LEVOTHROID) 50 MCG tablet TAKE ONE TABLET BY MOUTH ONCE DAILY BEFORE BREAKFAST 05/27/15   Jade L Breeback, PA-C  lisdexamfetamine (VYVANSE) 50 MG capsule Take 1 capsule (50 mg total) by mouth daily. Do not refill until 07/22/2015 05/27/15   Donella Stade, PA-C  omeprazole (PRILOSEC) 40 MG capsule Take 40 mg by mouth daily.      Historical Provider, MD  sildenafil (REVATIO) 20 MG tablet TAKE 1-5 TABLETS BY MOUTH AS NEEDED 02/24/15   Jade L Breeback, PA-C  testosterone cypionate (DEPOTESTOSTERONE CYPIONATE) 200 MG/ML injection Inject 1 mL (200 mg total) into the muscle every 14 (fourteen) days. Please include 23-gauge needles, and 1 cc syringes enough to last 6 months. 05/27/15   Donella Stade, PA-C   Meds Ordered and Administered this Visit  Medications - No data to display  BP 138/87 mmHg  Pulse 78  Temp(Src) 98 F (36.7 C) (Oral)  Ht 6\' 3"  (1.905 m)  Wt 252 lb (114.306 kg)  BMI 31.50 kg/m2  SpO2 100% No data found.   Physical Exam  Constitutional: He is oriented to person, place, and time. He appears well-developed and well-nourished. No  distress.  HENT:  Head: Normocephalic.  Mouth/Throat: Oropharynx is clear and moist.  Eyes: Conjunctivae are normal. Pupils are equal, round, and reactive to light.  Neck: Neck supple.  Cardiovascular: Normal heart sounds.   Pulmonary/Chest: Breath sounds normal.  Abdominal: There is no tenderness.  Musculoskeletal: He exhibits no edema.  Lymphadenopathy:    He has no cervical adenopathy.  Neurological: He is alert and oriented to person, place, and time.  Skin: Skin is warm and dry. No rash noted.     Tattoo left forearm appears well healed.  Nursing note and vitals reviewed.   ED Course  Procedures none    Labs Reviewed  HEPATITIS B SURFACE ANTIGEN    HEPATITIS B CORE ANTIBODY, IGM  HEPATITIS B SURFACE ANTIBODY, QUANTITATIVE  HEPATITIS C ANTIBODY  RAPID HIV SCREEN (HIV 1/2 AB+AG)      MDM   1. Exposure to blood or body fluid    Minimal possibility of significant exposure. At patient's request will obtain the following: Hep B and C antibodies; rapid HIV screen                                                                                                                                                                                                                          Kandra Nicolas, MD 07/19/15 2028

## 2015-07-14 LAB — HIV ANTIBODY (ROUTINE TESTING W REFLEX): HIV 1&2 Ab, 4th Generation: NONREACTIVE

## 2015-07-14 LAB — HEPATITIS B SURFACE ANTIBODY, QUANTITATIVE: HEPATITIS B-POST: 0 m[IU]/mL

## 2015-07-14 LAB — HEPATITIS B CORE ANTIBODY, IGM: HEP B C IGM: NONREACTIVE

## 2015-07-14 LAB — HEPATITIS B SURFACE ANTIGEN: Hepatitis B Surface Ag: NEGATIVE

## 2015-07-14 LAB — HEPATITIS C ANTIBODY: HCV AB: NEGATIVE

## 2015-07-15 ENCOUNTER — Telehealth: Payer: Self-pay | Admitting: *Deleted

## 2015-07-15 NOTE — ED Notes (Unsigned)
LM to return call for lab results. Password "[email or web address]".

## 2015-07-16 ENCOUNTER — Telehealth: Payer: Self-pay | Admitting: Emergency Medicine

## 2015-07-16 NOTE — ED Notes (Addendum)
Have left message, again, to call us for lab results. Communicable Disease dept of HD recommends follow up labs in 30 days, but patient will not return call.

## 2015-07-16 NOTE — ED Notes (Signed)
Informed patient to call for lab results and follow up.

## 2015-09-04 ENCOUNTER — Ambulatory Visit: Payer: Self-pay | Admitting: Physician Assistant

## 2015-09-07 ENCOUNTER — Other Ambulatory Visit: Payer: Self-pay | Admitting: Physician Assistant

## 2015-09-07 DIAGNOSIS — E291 Testicular hypofunction: Secondary | ICD-10-CM

## 2015-09-08 ENCOUNTER — Telehealth: Payer: Self-pay | Admitting: *Deleted

## 2015-09-08 NOTE — Telephone Encounter (Signed)
PA faxed to [facility] for testosterone

## 2015-09-14 ENCOUNTER — Ambulatory Visit (INDEPENDENT_AMBULATORY_CARE_PROVIDER_SITE_OTHER): Payer: BLUE CROSS/BLUE SHIELD | Admitting: Physician Assistant

## 2015-09-14 ENCOUNTER — Encounter: Payer: Self-pay | Admitting: Physician Assistant

## 2015-09-14 VITALS — BP 122/69 | HR 55 | Ht 75.0 in | Wt 246.0 lb

## 2015-09-14 DIAGNOSIS — G4700 Insomnia, unspecified: Secondary | ICD-10-CM | POA: Diagnosis not present

## 2015-09-14 DIAGNOSIS — Z0000 Encounter for general adult medical examination without abnormal findings: Secondary | ICD-10-CM | POA: Diagnosis not present

## 2015-09-14 DIAGNOSIS — Z13220 Encounter for screening for lipoid disorders: Secondary | ICD-10-CM

## 2015-09-14 DIAGNOSIS — R5383 Other fatigue: Secondary | ICD-10-CM | POA: Diagnosis not present

## 2015-09-14 DIAGNOSIS — E291 Testicular hypofunction: Secondary | ICD-10-CM | POA: Diagnosis not present

## 2015-09-14 DIAGNOSIS — F331 Major depressive disorder, recurrent, moderate: Secondary | ICD-10-CM

## 2015-09-14 DIAGNOSIS — F419 Anxiety disorder, unspecified: Secondary | ICD-10-CM

## 2015-09-14 DIAGNOSIS — R001 Bradycardia, unspecified: Secondary | ICD-10-CM

## 2015-09-14 DIAGNOSIS — K219 Gastro-esophageal reflux disease without esophagitis: Secondary | ICD-10-CM

## 2015-09-14 DIAGNOSIS — E039 Hypothyroidism, unspecified: Secondary | ICD-10-CM

## 2015-09-14 DIAGNOSIS — F900 Attention-deficit hyperactivity disorder, predominantly inattentive type: Secondary | ICD-10-CM

## 2015-09-14 DIAGNOSIS — Z131 Encounter for screening for diabetes mellitus: Secondary | ICD-10-CM

## 2015-09-14 DIAGNOSIS — Z113 Encounter for screening for infections with a predominantly sexual mode of transmission: Secondary | ICD-10-CM

## 2015-09-14 MED ORDER — LISDEXAMFETAMINE DIMESYLATE 60 MG PO CAPS: 60.0000 mg | ORAL_CAPSULE | ORAL | 0 refills | Status: DC

## 2015-09-14 MED ORDER — CLONAZEPAM 0.5 MG PO TABS: 0.5000 mg | ORAL_TABLET | Freq: Every evening | ORAL | 5 refills | Status: DC | PRN

## 2015-09-14 MED ORDER — TESTOSTERONE CYPIONATE 200 MG/ML IM SOLN: 200.0000 mg | INTRAMUSCULAR | 0 refills | Status: DC

## 2015-09-14 MED ORDER — SILDENAFIL CITRATE 20 MG PO TABS: ORAL_TABLET | ORAL | 5 refills | Status: DC

## 2015-09-14 MED ORDER — AMBULATORY NON FORMULARY MEDICATION: 0 refills | Status: DC

## 2015-09-14 MED ORDER — TRAZODONE HCL 50 MG PO TABS: 25.0000 mg | ORAL_TABLET | Freq: Every evening | ORAL | 3 refills | Status: DC | PRN

## 2015-09-14 MED ORDER — TESTOSTERONE CYPIONATE 200 MG/ML IM SOLN
200.0000 mg | Freq: Once | INTRAMUSCULAR | Status: AC
  Administered 2015-09-14: 200 mg via INTRAMUSCULAR

## 2015-09-14 MED ORDER — OMEPRAZOLE 40 MG PO CPDR: 40.0000 mg | DELAYED_RELEASE_CAPSULE | Freq: Every day | ORAL | 5 refills | Status: DC

## 2015-09-14 NOTE — Patient Instructions (Signed)

## 2015-09-15 ENCOUNTER — Other Ambulatory Visit: Payer: Self-pay

## 2015-09-15 ENCOUNTER — Encounter: Payer: Self-pay | Admitting: Physician Assistant

## 2015-09-15 ENCOUNTER — Other Ambulatory Visit: Payer: Self-pay | Admitting: Physician Assistant

## 2015-09-15 DIAGNOSIS — E538 Deficiency of other specified B group vitamins: Secondary | ICD-10-CM | POA: Insufficient documentation

## 2015-09-15 DIAGNOSIS — E039 Hypothyroidism, unspecified: Secondary | ICD-10-CM

## 2015-09-15 DIAGNOSIS — R001 Bradycardia, unspecified: Secondary | ICD-10-CM | POA: Insufficient documentation

## 2015-09-15 DIAGNOSIS — E291 Testicular hypofunction: Secondary | ICD-10-CM

## 2015-09-15 LAB — CBC WITH DIFFERENTIAL/PLATELET
BASOS ABS: 99 {cells}/uL (ref 0–200)
Basophils Relative: 1 %
EOS ABS: 297 {cells}/uL (ref 15–500)
EOS PCT: 3 %
HCT: 44.1 % (ref 38.5–50.0)
Hemoglobin: 15 g/dL (ref 13.2–17.1)
Lymphocytes Relative: 28 %
Lymphs Abs: 2772 cells/uL (ref 850–3900)
MCH: 29.4 pg (ref 27.0–33.0)
MCHC: 34 g/dL (ref 32.0–36.0)
MCV: 86.5 fL (ref 80.0–100.0)
MONOS PCT: 6 %
MPV: 10 fL (ref 7.5–12.5)
Monocytes Absolute: 594 cells/uL (ref 200–950)
NEUTROS ABS: 6138 {cells}/uL (ref 1500–7800)
NEUTROS PCT: 62 %
PLATELETS: 284 10*3/uL (ref 140–400)
RBC: 5.1 MIL/uL (ref 4.20–5.80)
RDW: 13.8 % (ref 11.0–15.0)
WBC: 9.9 10*3/uL (ref 3.8–10.8)

## 2015-09-15 LAB — COMPLETE METABOLIC PANEL WITH GFR
ALT: 11 U/L (ref 9–46)
AST: 15 U/L (ref 10–40)
Albumin: 4.5 g/dL (ref 3.6–5.1)
Alkaline Phosphatase: 77 U/L (ref 40–115)
BILIRUBIN TOTAL: 0.4 mg/dL (ref 0.2–1.2)
BUN: 15 mg/dL (ref 7–25)
CALCIUM: 9.4 mg/dL (ref 8.6–10.3)
CHLORIDE: 103 mmol/L (ref 98–110)
CO2: 27 mmol/L (ref 20–31)
CREATININE: 0.97 mg/dL (ref 0.60–1.35)
Glucose, Bld: 86 mg/dL (ref 65–99)
Potassium: 4.3 mmol/L (ref 3.5–5.3)
Sodium: 139 mmol/L (ref 135–146)
TOTAL PROTEIN: 6.7 g/dL (ref 6.1–8.1)

## 2015-09-15 LAB — LIPID PANEL
CHOL/HDL RATIO: 3.5 ratio (ref <=5.0)
Cholesterol: 173 mg/dL (ref 125–200)
HDL: 49 mg/dL (ref >=40)
LDL CALC: 110 mg/dL (ref <130)
Triglycerides: 72 mg/dL (ref <150)
VLDL: 14 mg/dL (ref <30)

## 2015-09-15 LAB — VITAMIN D 25 HYDROXY (VIT D DEFICIENCY, FRACTURES): VIT D 25 HYDROXY: 29 ng/mL — AB (ref 30–100)

## 2015-09-15 LAB — PSA, TOTAL AND FREE
PSA, Free Pct: 30 % (ref >25)
PSA, Free: 0.17 ng/mL
PSA: 0.57 ng/mL (ref <=4.00)

## 2015-09-15 LAB — GC/CHLAMYDIA PROBE AMP
CT PROBE, AMP APTIMA: NOT DETECTED
GC PROBE AMP APTIMA: NOT DETECTED

## 2015-09-15 LAB — TSH: TSH: 20.54 m[IU]/L — AB (ref 0.40–4.50)

## 2015-09-15 LAB — VITAMIN B12: VITAMIN B 12: 297 pg/mL (ref 200–1100)

## 2015-09-15 LAB — T4, FREE: FREE T4: 1 ng/dL (ref 0.8–1.8)

## 2015-09-15 NOTE — Progress Notes (Signed)
Subjective:    Patient ID: Wesley Mckay, male    DOB: [birth year], 35 y.o.   MRN: [id number]  HPI Pt is a 35 yo male who presents to the clinic for CPE.   .. Active Ambulatory Problems    Diagnosis Date Noted  . Hypothyroidism 08/01/2007  . GERD 08/01/2007  . SEIZURE DISORDER 08/01/2007  . INSOMNIA 08/01/2007  . Anxiety disorder 10/04/2010  . 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 Bilateral foot pain 08/16/2012  . Bilateral plantar fasciitis 11/14/2014  . Inattention 11/17/2014  . No energy 11/17/2014  . Hyperlipidemia LDL goal <130 11/18/2014  . Attention deficit hyperactivity disorder (ADHD), predominantly inattentive type 12/19/2014  . Major depressive disorder, recurrent episode, moderate ([place]) 12/19/2014  . B12 deficiency 09/15/2015  . Sinus bradycardia 09/15/2015   Resolved Ambulatory Problems    Diagnosis Date Noted  . FOOT PAIN, RIGHT 11/29/2007   Past Medical History:  Diagnosis Date  . GERD (gastroesophageal reflux disease)   . History of dislocation of shoulder   . Insomnia   . Obesity   . Seizures ([place]) 03/2007  . Thyroid disease    .[name] Family History  Problem Relation Age of Onset  . Thyroid disease Maternal Grandmother   . Diabetes Father   . Heart attack Father    .[name] Social History   Social History  . Marital status: Married    Spouse name: N/A  . Number of children: N/A  . Years of education: N/A   Occupational History  . Not on file.   Social History Main Topics  . Smoking status: Former Smoker    Packs/day: 1.00    Years: 15.00    Types: Cigarettes  . Smokeless tobacco: Not on file  . Alcohol use 1.0 oz/week    2 drink(s) per week  . Drug use: No  . Sexual activity: Not on file   Other Topics Concern  . Not on file   Social History Narrative  . No narrative on file   Pt needs refills on medication.   Pt is concerned about fathers recent MI and wants to make sure he is healthy.   He has been out of testosterone for 2  weeks. Her certainly can feel it in his fatigue and energy. He wonders if anything else could be effecting his fatigue.   He request STI testing.    Review of Systems  All other systems reviewed and are negative.      Objective:   Physical Exam BP 122/69   Pulse (!) 55   Ht 6\' 3"  (1.905 m)   Wt 246 lb (111.6 kg)   BMI 30.75 kg/m   General Appearance:    Alert, cooperative, no distress, appears stated age  Head:    Normocephalic, without obvious abnormality, atraumatic  Eyes:    PERRL, conjunctiva/corneas clear, EOM's intact, fundi    benign, both eyes       Ears:    Normal TM's and external ear canals, both ears  Nose:   Nares normal, septum midline, mucosa normal, no drainage    or sinus tenderness  Throat:   Lips, mucosa, and tongue normal; teeth and gums normal  Neck:   Supple, symmetrical, trachea midline, no adenopathy;       thyroid:  No enlargement/tenderness/nodules; no carotid   bruit or JVD  Back:     Symmetric, no curvature, ROM normal, no CVA tenderness  Lungs:     Clear to auscultation bilaterally, respirations unlabored  Chest  wall:    No tenderness or deformity  Heart:    Regular rate and rhythm, S1 and S2 normal, no murmur, rub   or gallop  Abdomen:     Soft, non-tender, bowel sounds active all four quadrants,    no masses, no organomegaly  Genitalia:    Normal male without lesion, discharge or tenderness  Rectal:    Normal tone, mildly enlarged prostate, no masses or tenderness;   guaiac negative stool  Extremities:   Extremities normal, atraumatic, no cyanosis or edema  Pulses:   2+ and symmetric all extremities  Skin:   Skin color, texture, turgor normal, no rashes or lesions  Lymph nodes:   Cervical, supraclavicular, and axillary nodes normal  Neurologic:   CNII-XII intact. Normal strength, sensation and reflexes      throughout         Assessment & Plan:  CPE- DRE mild enlargement, no masses. AUA was 6, delighted with quality of life. Hemoccult  negative.  Lipid and cmp ordered. PSA and CBC ordered. STD screening ordered.    Baseline EKG- concerned about family hx.  Sinus bradycardia at 49. RBBB. Peaked [place] in V2 and V3 and V4. No ST depression or elevation.   ADHD- refilled vyvanse for 3 months. Increased to 60mg  daily.   ED- refilled reviato as needed for intercourse.   Insomnia- trazodone as needed for sleep.   MDD/anxiety- controlled without medication right now. klonapin as needed.   Hypothyroidism- recheck TSH and Free T4. Will adjust accordingly.   Hypogonadism- testosterone shot given today. Recheck testosterone once back on it one week after.

## 2015-09-16 ENCOUNTER — Other Ambulatory Visit: Payer: Self-pay | Admitting: Physician Assistant

## 2015-09-16 LAB — RPR

## 2015-09-16 MED ORDER — CYANOCOBALAMIN 1000 MCG/ML IJ SOLN: 1000.0000 ug | INTRAMUSCULAR | 0 refills | Status: DC

## 2015-09-16 NOTE — Telephone Encounter (Signed)
Approved and pt notified when he was in the office the other day

## 2015-09-17 ENCOUNTER — Encounter: Payer: BLUE CROSS/BLUE SHIELD | Admitting: Family Medicine

## 2015-09-25 ENCOUNTER — Encounter: Payer: BLUE CROSS/BLUE SHIELD | Admitting: Family Medicine

## 2015-11-30 ENCOUNTER — Other Ambulatory Visit: Payer: Self-pay | Admitting: Physician Assistant

## 2015-12-04 ENCOUNTER — Other Ambulatory Visit: Payer: Self-pay | Admitting: *Deleted

## 2015-12-04 MED ORDER — CYANOCOBALAMIN 1000 MCG/ML IJ SOLN: 1000.0000 ug | INTRAMUSCULAR | 0 refills | Status: DC

## 2015-12-21 ENCOUNTER — Ambulatory Visit: Payer: Self-pay | Admitting: Physician Assistant

## 2015-12-30 ENCOUNTER — Ambulatory Visit (INDEPENDENT_AMBULATORY_CARE_PROVIDER_SITE_OTHER): Payer: BLUE CROSS/BLUE SHIELD | Admitting: Physician Assistant

## 2015-12-30 ENCOUNTER — Encounter: Payer: Self-pay | Admitting: Physician Assistant

## 2015-12-30 VITALS — BP 126/68 | HR 74 | Ht 75.0 in | Wt 237.0 lb

## 2015-12-30 DIAGNOSIS — E291 Testicular hypofunction: Secondary | ICD-10-CM | POA: Diagnosis not present

## 2015-12-30 DIAGNOSIS — E039 Hypothyroidism, unspecified: Secondary | ICD-10-CM

## 2015-12-30 DIAGNOSIS — F900 Attention-deficit hyperactivity disorder, predominantly inattentive type: Secondary | ICD-10-CM

## 2015-12-30 MED ORDER — LISDEXAMFETAMINE DIMESYLATE 60 MG PO CAPS: 60.0000 mg | ORAL_CAPSULE | ORAL | 0 refills | Status: DC

## 2015-12-30 MED ORDER — LEVOTHYROXINE SODIUM 50 MCG PO TABS: ORAL_TABLET | ORAL | 1 refills | Status: DC

## 2015-12-30 MED ORDER — LEVOTHYROXINE SODIUM 200 MCG PO TABS: ORAL_TABLET | ORAL | 1 refills | Status: DC

## 2015-12-30 NOTE — Progress Notes (Signed)
   Subjective:    Patient ID: Wesley Mckay, male    DOB: Mar 30, 1980, 35 y.o.   MRN: [id number]  HPI Pt is a 35 yo male who presents to the clinic for medication follow up.   .. Active Ambulatory Problems    Diagnosis Date Noted  . Hypothyroidism 08/01/2007  . GERD 08/01/2007  . SEIZURE DISORDER 08/01/2007  . INSOMNIA 08/01/2007  . Anxiety disorder 10/04/2010  . 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 Bilateral foot pain 08/16/2012  . Bilateral plantar fasciitis 11/14/2014  . Inattention 11/17/2014  . No energy 11/17/2014  . Hyperlipidemia LDL goal <130 11/18/2014  . Attention deficit hyperactivity disorder (ADHD), predominantly inattentive type 12/19/2014  . Major depressive disorder, recurrent episode, moderate ([place]) 12/19/2014  . B12 deficiency 09/15/2015  . Sinus bradycardia 09/15/2015   Resolved Ambulatory Problems    Diagnosis Date Noted  . FOOT PAIN, RIGHT 11/29/2007   Past Medical History:  Diagnosis Date  . GERD (gastroesophageal reflux disease)   . History of dislocation of shoulder   . Insomnia   . Obesity   . Seizures ([place]) 03/2007  . Thyroid disease    ADHD- doing well on vyvanse. Work going good. No insomnia, palpitations, headaches, dizziness.   Hypothyroidism- levels recently checked. Doing well. No issues to report. Taking medication daily.   Testosterone injection 200mg  every 2 weeks. No problems or concerns. No increased agitation. Feels like is helping him with energy and better mood.    Review of Systems  All other systems reviewed and are negative.      Objective:   Physical Exam  Constitutional: He is oriented to person, place, and time. He appears well-developed and well-nourished.  HENT:  Head: Normocephalic and atraumatic.  Cardiovascular: Normal rate, regular rhythm and normal heart sounds.   Pulmonary/Chest: Effort normal and breath sounds normal.  Neurological: He is alert and oriented to person, place, and time.  Psychiatric: He has  a normal mood and affect. His behavior is normal.          Assessment & Plan:  Marland KitchenMarland KitchenDiagnoses and all orders for this visit:  Male hypogonadism -     Testosterone  Attention deficit hyperactivity disorder (ADHD), predominantly inattentive type -     lisdexamfetamine (VYVANSE) 60 MG capsule; Take 1 capsule (60 mg total) by mouth every morning.  Hypothyroidism, unspecified type -     levothyroxine (SYNTHROID, LEVOTHROID) 200 MCG tablet; TAKE 1 TABLET (200 MCG TOTAL) BY MOUTH DAILY. -     levothyroxine (SYNTHROID, LEVOTHROID) 50 MCG tablet; TAKE ONE TABLET BY MOUTH ONCE DAILY BEFORE BREAKFAST  Other orders -     Discontinue: lisdexamfetamine (VYVANSE) 60 MG capsule; Take 1 capsule (60 mg total) by mouth every morning. -     Discontinue: lisdexamfetamine (VYVANSE) 60 MG capsule; Take 1 capsule (60 mg total) by mouth every morning.   Last TSH labs were good and checked in 08/2015.   Refilled vyvanse for 3 months. Vitals great. No side effects.   Pt testosterone never checked. 200mg  every 2 weeks he gives himself injection. Cbc, psa, hepatic function look good back in 08/2015.

## 2015-12-31 LAB — TESTOSTERONE: Testosterone: 428 ng/dL (ref 250–827)

## 2016-02-25 ENCOUNTER — Encounter: Payer: Self-pay | Admitting: Physician Assistant

## 2016-02-29 ENCOUNTER — Encounter: Payer: Self-pay | Admitting: Physician Assistant

## 2016-02-29 ENCOUNTER — Ambulatory Visit (INDEPENDENT_AMBULATORY_CARE_PROVIDER_SITE_OTHER): Payer: BLUE CROSS/BLUE SHIELD | Admitting: Physician Assistant

## 2016-02-29 VITALS — BP 123/77 | HR 73 | Ht 75.0 in | Wt 239.0 lb

## 2016-02-29 DIAGNOSIS — N529 Male erectile dysfunction, unspecified: Secondary | ICD-10-CM | POA: Diagnosis not present

## 2016-02-29 DIAGNOSIS — G40909 Epilepsy, unspecified, not intractable, without status epilepticus: Secondary | ICD-10-CM

## 2016-02-29 MED ORDER — SILDENAFIL CITRATE 20 MG PO TABS: ORAL_TABLET | ORAL | 5 refills | Status: DC

## 2016-02-29 NOTE — Progress Notes (Addendum)
   Subjective:    Patient ID: Wesley Mckay, male    DOB: 11/18/1980, 36 y.o.   MRN: [id number]  HPI Patient is a 36yo male who presents to the clinic to be cleared through the [facility] to drive.  Patient's PMH is significant for a seizure disorder.  Patient's first seizure was in 2009 and his second  was in 2011. Patient received a EEG and MRI but does not have the records.  Patient denies residual brain damage, memory loss, emotional instability.  Patient states his strength and coordination are preserved. Patient states he used to take Keppra but has not taken it in over 2 years.  Patient denies any recent auras, seizures, or loss of consciousness.        Patient requesting refill of sildenafil.     Review of Systems  All other systems reviewed and are negative.      Objective:   Physical Exam  Constitutional: He is oriented to person, place, and time. He appears well-developed and well-nourished.  HENT:  Head: Normocephalic and atraumatic.  Neurological: He is alert and oriented to person, place, and time.  Psychiatric: He has a normal mood and affect. His behavior is normal.  Nursing note and vitals reviewed.         Assessment & Plan:  Marland KitchenMarland KitchenDiagnoses and all orders for this visit:  Seizure disorder [facility])  Erectile dysfunction, unspecified erectile dysfunction type  Other orders -     sildenafil (REVATIO) 20 MG tablet; TAKE 1-5 TABLETS BY MOUTH AS NEEDED     Patient's DMV paperwork was filled out.  All questions and concerns addressed.  Patient cleared to operate motor vehicle.   Patient requested refill of Sildenafil 20mg .  Tolerating medication well.   Patient can follow-up as needed.

## 2016-03-16 ENCOUNTER — Ambulatory Visit (INDEPENDENT_AMBULATORY_CARE_PROVIDER_SITE_OTHER): Payer: BLUE CROSS/BLUE SHIELD | Admitting: Family Medicine

## 2016-03-16 VITALS — BP 148/83 | HR 86 | Wt 243.0 lb

## 2016-03-16 DIAGNOSIS — M722 Plantar fascial fibromatosis: Secondary | ICD-10-CM

## 2016-03-16 NOTE — Patient Instructions (Signed)
Thank you for coming in today. Call or go to the ER if you develop a large red swollen joint with extreme pain or oozing puss.  Return as needed.    Plantar Fasciitis Plantar fasciitis is a painful foot condition that affects the heel. It occurs when the band of tissue that connects the toes to the heel bone (plantar fascia) becomes irritated. This can happen after exercising too much or doing other repetitive activities (overuse injury). The pain from plantar fasciitis can range from mild irritation to severe pain that makes it difficult for you to walk or move. The pain is usually worse in the morning or after you have been sitting or lying down for a while. CAUSES This condition may be caused by:  Standing for long periods of time.  Wearing shoes that do not fit.  Doing high-impact activities, including running, aerobics, and ballet.  Being overweight.  Having an abnormal way of walking (gait).  Having tight calf muscles.  Having high arches in your feet.  Starting a new athletic activity. SYMPTOMS The main symptom of this condition is heel pain. Other symptoms include:  Pain that gets worse after activity or exercise.  Pain that is worse in the morning or after resting.  Pain that goes away after you walk for a few minutes. DIAGNOSIS This condition may be diagnosed based on your signs and symptoms. Your health care provider will also do a physical exam to check for:  A tender area on the bottom of your foot.  A high arch in your foot.  Pain when you move your foot.  Difficulty moving your foot. You may also need to have imaging studies to confirm the diagnosis. These can include:  X-rays.  Ultrasound.  MRI. TREATMENT  Treatment for plantar fasciitis depends on the severity of the condition. Your treatment may include:  Rest, ice, and over-the-counter pain medicines to manage your pain.  Exercises to stretch your calves and your plantar fascia.  A splint  that holds your foot in a stretched, upward position while you sleep (night splint).  Physical therapy to relieve symptoms and prevent problems in the future.  Cortisone injections to relieve severe pain.  Extracorporeal shock wave therapy (ESWT) to stimulate damaged plantar fascia with electrical impulses. It is often used as a last resort before surgery.  Surgery, if other treatments have not worked after 12 months. HOME CARE INSTRUCTIONS  Take medicines only as directed by your health care provider.  Avoid activities that cause pain.  Roll the bottom of your foot over a bag of ice or a bottle of cold water. Do this for 20 minutes, 3-4 times a day.  Perform simple stretches as directed by your health care provider.  Try wearing athletic shoes with air-sole or gel-sole cushions or soft shoe inserts.  Wear a night splint while sleeping, if directed by your health care provider.  Keep all follow-up appointments with your health care provider. PREVENTION   Do not perform exercises or activities that cause heel pain.  Consider finding low-impact activities if you continue to have problems.  Lose weight if you need to. The best way to prevent plantar fasciitis is to avoid the activities that aggravate your plantar fascia. SEEK MEDICAL CARE IF:  Your symptoms do not go away after treatment with home care measures.  Your pain gets worse.  Your pain affects your ability to move or do your daily activities. This information is not intended to replace advice given to  you by your health care provider. Make sure you discuss any questions you have with your health care provider. Document Released: 10/26/2000 Document Revised: 05/25/2015 Document Reviewed: 12/11/2013 Elsevier Interactive Patient Education  2017 [place].    Plantar Fasciitis Rehab Ask your health care provider which exercises are safe for you. Do exercises exactly as told by your health care provider and adjust  them as directed. It is normal to feel mild stretching, pulling, tightness, or discomfort as you do these exercises, but you should stop right away if you feel sudden pain or your pain gets worse. Do not begin these exercises until told by your health care provider. Stretching and range of motion exercises These exercises warm up your muscles and joints and improve the movement and flexibility of your foot. These exercises also help to relieve pain. Exercise A: Plantar fascia stretch 1. Sit with your left / right leg crossed over your opposite knee. 2. Hold your heel with one hand with that thumb near your arch. With your other hand, hold your toes and gently pull them back toward the top of your foot. You should feel a stretch on the bottom of your toes or your foot or both. 3. Hold this stretch for__________ seconds. 4. Slowly release your toes and return to the starting position. Repeat __________ times. Complete this exercise __________ times a day. Exercise B: Gastroc, standing 1. Stand with your hands against a wall. 2. Extend your left / right leg behind you, and bend your front knee slightly. 3. Keeping your heels on the floor and keeping your back knee straight, shift your weight toward the wall without arching your back. You should feel a gentle stretch in your left / right calf. 4. Hold this position for __________ seconds. Repeat __________ times. Complete this exercise __________ times a day. Exercise C: Soleus, standing 1. Stand with your hands against a wall. 2. Extend your left / right leg behind you, and bend your front knee slightly. 3. Keeping your heels on the floor, bend your back knee and slightly shift your weight over the back leg. You should feel a gentle stretch deep in your calf. 4. Hold this position for __________ seconds. Repeat __________ times. Complete this exercise __________ times a day. Exercise D: Gastrocsoleus, standing 1. Stand with the ball of your left /  right foot on a step. The ball of your foot is on the walking surface, right under your toes. 2. Keep your other foot firmly on the same step. 3. Hold onto the wall or a railing for balance. 4. Slowly lift your other foot, allowing your body weight to press your heel down over the edge of the step. You should feel a stretch in your left / right calf. 5. Hold this position for __________ seconds. 6. Return both feet to the step. 7. Repeat this exercise with a slight bend in your left / right knee. Repeat __________ times with your left / right knee straight and __________ times with your left / right knee bent. Complete this exercise __________ times a day. Balance exercise This exercise builds your balance and strength control of your arch to help take pressure off your plantar fascia. Exercise E: Single leg stand 1. Without shoes, stand near a railing or in a doorway. You may hold onto the railing or door frame as needed. 2. Stand on your left / right foot. Keep your big toe down on the floor and try to keep your arch lifted. Do  not let your foot roll inward. 3. Hold this position for __________ seconds. 4. If this exercise is too easy, you can try it with your eyes closed or while standing on a pillow. Repeat __________ times. Complete this exercise __________ times a day. This information is not intended to replace advice given to you by your health care provider. Make sure you discuss any questions you have with your health care provider. Document Released: 01/31/2005 Document Revised: 10/06/2015 Document Reviewed: 12/15/2014 Elsevier Interactive Patient Education  2017 [employer].

## 2016-03-16 NOTE — Progress Notes (Signed)
Wesley Mckay is a 36 y.o. male who presents to [facility] today for foot pain. Patient has a history of bilateral left worse than right foot pain attributable to plantar fasciitis as well as ankle pronation subluxation and pes planus. He was seen by Dr. Dianah Field several years ago where he was made a pair of custom orthotics which worked very well. He has orthotics of essentially worn out and his pain has returned. He notes significantly worse left heel pain over the last few weeks. He is limping and having problems working. He's interested in a new pair of orthotics and an injection today if possible. He denies any repeat foot injury.   Past Medical History:  Diagnosis Date  . GERD (gastroesophageal reflux disease)   . History of dislocation of shoulder   . Insomnia   . Obesity   . Seizures ([place]) 03/2007  . Thyroid disease    hypothyroidism   Past Surgical History:  Procedure Laterality Date  . SHOULDER SURGERY     Dr. Angelene Giovanni   Social History  Substance Use Topics  . Smoking status: Former Smoker    Packs/day: 1.00    Years: 15.00    Types: Cigarettes  . Smokeless tobacco: Not on file  . Alcohol use 1.0 oz/week    2 drink(s) per week     ROS:  As above   Medications: Current Outpatient Prescriptions  Medication Sig Dispense Refill  . AMBULATORY NON FORMULARY MEDICATION 3 ml syringe use every 2 weeks for testosterone injection  #100  22 1 1/2 gauge needle use every 2 weeks for testosterone injection  #100 100 each 0  . clonazePAM (KLONOPIN) 0.5 MG tablet Take 1 tablet (0.5 mg total) by mouth at bedtime as needed. 30 tablet 5  . cyanocobalamin (,VITAMIN B-12,) 1000 MCG/ML injection Inject 1 mL (1,000 mcg total) into the muscle every 30 (thirty) days. 10 mL 0  . levothyroxine (SYNTHROID, LEVOTHROID) 200 MCG tablet TAKE 1 TABLET (200 MCG TOTAL) BY MOUTH DAILY. 90 tablet 1  . levothyroxine (SYNTHROID, LEVOTHROID) 50 MCG  tablet TAKE ONE TABLET BY MOUTH ONCE DAILY BEFORE BREAKFAST 90 tablet 1  . lisdexamfetamine (VYVANSE) 60 MG capsule Take 1 capsule (60 mg total) by mouth every morning. 30 capsule 0  . Misc Natural Products (HORNY GOAT WEED PO) Take by mouth.    [name] omeprazole (PRILOSEC) 40 MG capsule Take 1 capsule (40 mg total) by mouth daily. 30 capsule 5  . sildenafil (REVATIO) 20 MG tablet TAKE 1-5 TABLETS BY MOUTH AS NEEDED 30 tablet 5  . testosterone cypionate (DEPOTESTOSTERONE CYPIONATE) 200 MG/ML injection Inject 1 mL (200 mg total) into the muscle every 14 (fourteen) days. Please include 23-gauge needles, and 1 cc syringes enough to last 6 months. 10 mL 0  . traZODone (DESYREL) 50 MG tablet Take 0.5-1 tablets (25-50 mg total) by mouth at bedtime as needed for sleep. 30 tablet 3   No current facility-administered medications for this visit.    Allergies  Allergen Reactions  . Sulfa Antibiotics     Itching and rash     Exam:  BP (!) 148/83   Pulse 86   Wt 243 lb (110.2 kg)   BMI 30.37 kg/m  General: Well Developed, well nourished, and in no acute distress.  Neuro/Psych: Alert and oriented x3, extra-ocular muscles intact, able to move all 4 extremities, sensation grossly intact. Skin: Warm and dry, no rashes noted.  Respiratory: Not using accessory muscles,  speaking in full sentences, trachea midline.  Cardiovascular: Pulses palpable, no extremity edema. Abdomen: Does not appear distended. MSK: Pes planus bilaterally left ankle subluxation medially. Tender to palpation bilateral plantar calcaneus left much worse than right. Antalgic gait present. Pulses capillary refill and sensation intact.  Procedure: Real-time Ultrasound Guided Injection of left plantar fascia  Device: GE Logiq E  Images permanently stored and available for review in the ultrasound unit. Verbal informed consent obtained. Discussed risks and benefits of procedure. Warned about infection bleeding damage to structures skin  hypopigmentation and fat atrophy among others. Patient expresses understanding and agreement Time-out conducted.  Noted no overlying erythema, induration, or other signs of local infection.  Skin prepped in a sterile fashion.  Local anesthesia: Topical Ethyl chloride.  With sterile technique and under real time ultrasound guidance: 40 mg of Kenalog and 3 mL of Marcaine injected easily.  Completed without difficulty  Pain immediately resolved suggesting accurate placement of the medication.  Advised to call if fevers/chills, erythema, induration, drainage, or persistent bleeding.  Images permanently stored and available for review in the ultrasound unit.  Impression: Technically successful ultrasound guided injection.         Orthotics Note:   Patient was fitted for a : standard, cushioned, semi-rigid orthotic. The orthotic was heated and afterward the patient stood on the orthotic blank positioned on the orthotic stand. The patient was positioned in subtalar neutral position and 10 degrees of ankle dorsiflexion in a weight bearing stance. After completion of molding, a stable base was applied to the orthotic blank. The blank was ground to a stable position for weight bearing. Size: 14 Base: [employer] and safety inspector and Padding: Additional arch support molded. The patient ambulated these, and they were very comfortable.  I spent 40 minutes with this patient, greater than 50% was face-to-face time counseling regarding the below diagnosis.     No results found for this or any previous visit (from the past 48 hour(s)). No results found.    Assessment and Plan: 36 y.o. male with  Bilateral plantar fasciitis left worse than right. Injection today and a pair of Custom orthotics were made. Recheck PRN.     No orders of the defined types were placed in this encounter.   Discussed warning signs or symptoms. Please see discharge instructions. Patient expresses  understanding.

## 2016-04-01 ENCOUNTER — Ambulatory Visit: Payer: Self-pay | Admitting: Physician Assistant

## 2016-04-11 ENCOUNTER — Encounter: Payer: Self-pay | Admitting: Physician Assistant

## 2016-04-11 ENCOUNTER — Ambulatory Visit (INDEPENDENT_AMBULATORY_CARE_PROVIDER_SITE_OTHER): Payer: BLUE CROSS/BLUE SHIELD | Admitting: Physician Assistant

## 2016-04-11 VITALS — BP 123/75 | HR 62 | Ht 75.0 in | Wt 244.0 lb

## 2016-04-11 DIAGNOSIS — E291 Testicular hypofunction: Secondary | ICD-10-CM | POA: Diagnosis not present

## 2016-04-11 DIAGNOSIS — E538 Deficiency of other specified B group vitamins: Secondary | ICD-10-CM | POA: Diagnosis not present

## 2016-04-11 DIAGNOSIS — F900 Attention-deficit hyperactivity disorder, predominantly inattentive type: Secondary | ICD-10-CM

## 2016-04-11 DIAGNOSIS — E559 Vitamin D deficiency, unspecified: Secondary | ICD-10-CM | POA: Diagnosis not present

## 2016-04-11 LAB — CBC WITH DIFFERENTIAL/PLATELET
BASOS ABS: 111 {cells}/uL (ref 0–200)
Basophils Relative: 1 %
EOS PCT: 3 %
Eosinophils Absolute: 333 cells/uL (ref 15–500)
HCT: 43.7 % (ref 38.5–50.0)
Hemoglobin: 14.8 g/dL (ref 13.2–17.1)
Lymphocytes Relative: 23 %
Lymphs Abs: 2553 cells/uL (ref 850–3900)
MCH: 29.7 pg (ref 27.0–33.0)
MCHC: 33.9 g/dL (ref 32.0–36.0)
MCV: 87.8 fL (ref 80.0–100.0)
MONOS PCT: 7 %
MPV: 9.5 fL (ref 7.5–12.5)
Monocytes Absolute: 777 cells/uL (ref 200–950)
NEUTROS ABS: 7326 {cells}/uL (ref 1500–7800)
Neutrophils Relative %: 66 %
PLATELETS: 297 10*3/uL (ref 140–400)
RBC: 4.98 MIL/uL (ref 4.20–5.80)
RDW: 14.5 % (ref 11.0–15.0)
WBC: 11.1 10*3/uL — AB (ref 3.8–10.8)

## 2016-04-11 LAB — COMPLETE METABOLIC PANEL WITH GFR
ALT: 12 U/L (ref 9–46)
AST: 14 U/L (ref 10–40)
Albumin: 4.4 g/dL (ref 3.6–5.1)
Alkaline Phosphatase: 73 U/L (ref 40–115)
BUN: 9 mg/dL (ref 7–25)
CHLORIDE: 106 mmol/L (ref 98–110)
CO2: 27 mmol/L (ref 20–31)
Calcium: 9.8 mg/dL (ref 8.6–10.3)
Creat: 0.96 mg/dL (ref 0.60–1.35)
Glucose, Bld: 78 mg/dL (ref 65–99)
Potassium: 4.2 mmol/L (ref 3.5–5.3)
SODIUM: 142 mmol/L (ref 135–146)
Total Bilirubin: 0.5 mg/dL (ref 0.2–1.2)
Total Protein: 7.1 g/dL (ref 6.1–8.1)

## 2016-04-11 LAB — VITAMIN B12: Vitamin B-12: 851 pg/mL (ref 200–1100)

## 2016-04-11 LAB — PSA: PSA: 0.6 ng/mL (ref <=4.0)

## 2016-04-11 LAB — TSH: TSH: 25.54 mIU/L — ABNORMAL HIGH (ref 0.40–4.50)

## 2016-04-11 MED ORDER — OMEPRAZOLE 40 MG PO CPDR: 40.0000 mg | DELAYED_RELEASE_CAPSULE | Freq: Every day | ORAL | 1 refills | Status: DC

## 2016-04-11 MED ORDER — LISDEXAMFETAMINE DIMESYLATE 60 MG PO CAPS: 60.0000 mg | ORAL_CAPSULE | ORAL | 0 refills | Status: DC

## 2016-04-11 NOTE — Progress Notes (Addendum)
Subjective:     Patient ID: Wesley Mckay, male   DOB: 05/17/1980, 36 y.o.   MRN: [id number]  HPI  Patient presents today for medication refill. He denies any current complaints and states he believes he is well managed on current regimens. Patient relays recent job loss and requests refill of medications prior to running out of insurance at the end of this month.  He did feel like he had more energy after starting b12 and vitamin D but has seen to plateau.   He denies problems with current dose of vyvanse. He will not need as much since he quit work. He will be looking for a job.   Review of Systems All other ROS negative except those noted in the HPI.    Objective:   Physical Exam  Constitutional: He is oriented to person, place, and time. He appears well-developed and well-nourished.  HENT:  Head: Normocephalic and atraumatic.  Neck: Normal range of motion. Neck supple.  Cardiovascular: Normal rate and regular rhythm.  Exam reveals no gallop and no friction rub.   No murmur heard. Pulmonary/Chest: Effort normal and breath sounds normal. He has no wheezes. He has no rales.  Musculoskeletal: Normal range of motion. He exhibits no edema.  Neurological: He is alert and oriented to person, place, and time.  Skin: Skin is warm and dry.  Psychiatric: He has a normal mood and affect. His behavior is normal.       Assessment/Plan:  Diagnoses and all orders for this visit:  Male hypogonadism -     TSH -     Vitamin B12 -     VITAMIN D 25 Hydroxy (Vit-D Deficiency, Fractures) -     Testosterone -     CBC with Differential/Platelet -     PSA -     COMPLETE METABOLIC PANEL WITH GFR  Attention deficit hyperactivity disorder (ADHD), predominantly inattentive type -     Discontinue: lisdexamfetamine (VYVANSE) 60 MG capsule; Take 1 capsule (60 mg total) by mouth every morning. -     Discontinue: lisdexamfetamine (VYVANSE) 60 MG capsule; Take 1 capsule (60 mg total) by mouth every  morning. Do not refill until 05/07/16. -     lisdexamfetamine (VYVANSE) 60 MG capsule; Take 1 capsule (60 mg total) by mouth every morning. Do not refill until 06/05/16.  Vitamin D deficiency -     VITAMIN D 25 Hydroxy (Vit-D Deficiency, Fractures)  Vitamin B12 deficiency -     Vitamin B12  Hypogonadism in male -     TSH -     COMPLETE METABOLIC PANEL WITH GFR  Other orders -     omeprazole (PRILOSEC) 40 MG capsule; Take 1 capsule (40 mg total) by mouth daily.   - Labs rechecked today to help guide current treatment regimen. Medications refilled.

## 2016-04-12 LAB — VITAMIN D 25 HYDROXY (VIT D DEFICIENCY, FRACTURES): VIT D 25 HYDROXY: 31 ng/mL (ref 30–100)

## 2016-04-12 LAB — TESTOSTERONE: TESTOSTERONE: 201 ng/dL — AB (ref 250–827)

## 2016-04-13 ENCOUNTER — Other Ambulatory Visit: Payer: Self-pay | Admitting: Physician Assistant

## 2016-04-13 DIAGNOSIS — E291 Testicular hypofunction: Secondary | ICD-10-CM

## 2016-04-20 ENCOUNTER — Other Ambulatory Visit: Payer: Self-pay | Admitting: Physician Assistant

## 2016-04-20 DIAGNOSIS — E291 Testicular hypofunction: Secondary | ICD-10-CM

## 2016-04-21 MED ORDER — TESTOSTERONE CYPIONATE 200 MG/ML IM SOLN: 200.0000 mg | INTRAMUSCULAR | 0 refills | Status: DC

## 2016-05-06 ENCOUNTER — Other Ambulatory Visit: Payer: Self-pay | Admitting: Physician Assistant

## 2016-06-06 ENCOUNTER — Other Ambulatory Visit: Payer: Self-pay | Admitting: *Deleted

## 2016-06-06 ENCOUNTER — Encounter: Payer: Self-pay | Admitting: Physician Assistant

## 2016-06-06 ENCOUNTER — Other Ambulatory Visit: Payer: Self-pay | Admitting: Physician Assistant

## 2016-06-06 MED ORDER — CYANOCOBALAMIN 1000 MCG/ML IJ SOLN: 1000.0000 ug | INTRAMUSCULAR | 0 refills | Status: DC

## 2016-06-06 NOTE — Telephone Encounter (Signed)
Ok to send refill to preferred pharmacy for 6 months. I just checked labs in feb.

## 2016-06-30 ENCOUNTER — Telehealth: Payer: Self-pay | Admitting: Physician Assistant

## 2016-06-30 DIAGNOSIS — E291 Testicular hypofunction: Secondary | ICD-10-CM

## 2016-07-21 MED ORDER — TESTOSTERONE CYPIONATE 200 MG/ML IM SOLN: 200.0000 mg | INTRAMUSCULAR | 0 refills | Status: DC

## 2016-07-21 MED ORDER — CYANOCOBALAMIN 1000 MCG/ML IJ SOLN: 1000.0000 ug | INTRAMUSCULAR | 0 refills | Status: DC

## 2016-07-22 ENCOUNTER — Encounter: Payer: Self-pay | Admitting: Physician Assistant

## 2016-07-22 ENCOUNTER — Ambulatory Visit (INDEPENDENT_AMBULATORY_CARE_PROVIDER_SITE_OTHER): Payer: Managed Care, Other (non HMO) | Admitting: Physician Assistant

## 2016-07-22 VITALS — BP 112/74 | HR 73 | Ht 75.0 in | Wt 257.0 lb

## 2016-07-22 DIAGNOSIS — E039 Hypothyroidism, unspecified: Secondary | ICD-10-CM

## 2016-07-22 DIAGNOSIS — F900 Attention-deficit hyperactivity disorder, predominantly inattentive type: Secondary | ICD-10-CM | POA: Diagnosis not present

## 2016-07-22 DIAGNOSIS — E291 Testicular hypofunction: Secondary | ICD-10-CM

## 2016-07-22 DIAGNOSIS — N529 Male erectile dysfunction, unspecified: Secondary | ICD-10-CM | POA: Diagnosis not present

## 2016-07-22 DIAGNOSIS — F419 Anxiety disorder, unspecified: Secondary | ICD-10-CM | POA: Diagnosis not present

## 2016-07-22 MED ORDER — LEVOTHYROXINE SODIUM 50 MCG PO TABS: ORAL_TABLET | ORAL | 0 refills | Status: DC

## 2016-07-22 MED ORDER — LEVOTHYROXINE SODIUM 200 MCG PO TABS: ORAL_TABLET | ORAL | 0 refills | Status: DC

## 2016-07-22 MED ORDER — LISDEXAMFETAMINE DIMESYLATE 60 MG PO CAPS: 60.0000 mg | ORAL_CAPSULE | ORAL | 0 refills | Status: DC

## 2016-07-22 MED ORDER — SILDENAFIL CITRATE 20 MG PO TABS: ORAL_TABLET | ORAL | 5 refills | Status: DC

## 2016-07-22 MED ORDER — CLONAZEPAM 0.5 MG PO TABS: 0.5000 mg | ORAL_TABLET | Freq: Every evening | ORAL | 0 refills | Status: DC | PRN

## 2016-07-22 MED ORDER — AMBULATORY NON FORMULARY MEDICATION: 0 refills | Status: AC

## 2016-07-22 MED ORDER — LISDEXAMFETAMINE DIMESYLATE 60 MG PO CAPS
60.0000 mg | ORAL_CAPSULE | ORAL | 0 refills | Status: DC
Start: 2016-07-22 — End: 2016-09-28

## 2016-07-22 NOTE — Progress Notes (Signed)
Subjective:    Patient ID: Wesley Mckay, male    DOB: 12-26-1980, 36 y.o.   MRN: [id number]  HPI Pt is a 36 yo male who presents to the clinic for 3 month follow up on ADHD medications. He is doing ok with these. He is currently out of work and working at home. He is doing a lot of renovations. He denies any side effects or problems.   Continues to get testosterone shots every 2 weeks. He continues to have low testosterone levels and no energy. His thyroid is also low at last recheck 3 months ago.   .. Active Ambulatory Problems    Diagnosis Date Noted  . Hypothyroidism 08/01/2007  . GERD 08/01/2007  . SEIZURE DISORDER 08/01/2007  . INSOMNIA 08/01/2007  . Anxiety disorder 10/04/2010  . 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 Bilateral foot pain 08/16/2012  . Bilateral plantar fasciitis 11/14/2014  . Inattention 11/17/2014  . No energy 11/17/2014  . Hyperlipidemia LDL goal <130 11/18/2014  . Attention deficit hyperactivity disorder (ADHD), predominantly inattentive type 12/19/2014  . Major depressive disorder, recurrent episode, moderate ([place]) 12/19/2014  . Vitamin B12 deficiency 09/15/2015  . Sinus bradycardia 09/15/2015  . Seizure disorder ([place]) 02/29/2016  . Erectile dysfunction 02/29/2016  . Vitamin D deficiency 04/11/2016   Resolved Ambulatory Problems    Diagnosis Date Noted  . FOOT PAIN, RIGHT 11/29/2007   Past Medical History:  Diagnosis Date  . GERD (gastroesophageal reflux disease)   . History of dislocation of shoulder   . Insomnia   . Obesity   . Seizures ([place]) 03/2007  . Thyroid disease      Review of Systems  All other systems reviewed and are negative.      Objective:   Physical Exam  Constitutional: He is oriented to person, place, and time. He appears well-developed and well-nourished.  HENT:  Head: Normocephalic and atraumatic.  Cardiovascular: Normal rate, regular rhythm and normal heart sounds.   Pulmonary/Chest: Effort normal and breath  sounds normal.  Neurological: He is alert and oriented to person, place, and time.  Psychiatric: He has a normal mood and affect. His behavior is normal.          Assessment & Plan:  Marland KitchenMarland KitchenSemisi was seen today for hypothyroidism and hypogonadism.  Diagnoses and all orders for this visit:  Attention deficit hyperactivity disorder (ADHD), predominantly inattentive type -     Discontinue: lisdexamfetamine (VYVANSE) 60 MG capsule; Take 1 capsule (60 mg total) by mouth every morning. -     Discontinue: lisdexamfetamine (VYVANSE) 60 MG capsule; Take 1 capsule (60 mg total) by mouth every morning. Do not refill until 08/20/16 -     lisdexamfetamine (VYVANSE) 60 MG capsule; Take 1 capsule (60 mg total) by mouth every morning. Do not refill until 09/19/16  Hypothyroidism, unspecified type -     levothyroxine (SYNTHROID, LEVOTHROID) 50 MCG tablet; TAKE ONE TABLET BY MOUTH ONCE DAILY BEFORE BREAKFAST -     levothyroxine (SYNTHROID, LEVOTHROID) 200 MCG tablet; TAKE 1 TABLET (200 MCG TOTAL) BY MOUTH DAILY. -     TSH  Male hypogonadism  Anxiety -     clonazePAM (KLONOPIN) 0.5 MG tablet; Take 1 tablet (0.5 mg total) by mouth at bedtime as needed.  Erectile dysfunction, unspecified erectile dysfunction type -     sildenafil (REVATIO) 20 MG tablet; TAKE 1-5 TABLETS BY MOUTH AS NEEDED  Anxiety disorder, unspecified type  Other orders -     AMBULATORY NON FORMULARY MEDICATION; 3 ml  syringe use every 2 weeks for testosterone injection  #100  22 1 1/2 gauge needle use every 2 weeks for testosterone injection  #100   3 months of vyvanse refilled.   Pt does not use a lot of klonapin just as needed but did ntted a refill. 30 no refills given.   Testosterone and thyroid are both low despite supplementation. Pt's levothyroxine dose is much higher than most and last recheck TSH was  25 in february. His last testosterone level was around 200 despite 200mg  every 2 weeks. His wife is a Marine scientist and reports into  the muscle will all of his shots.  Discussed with supervising physician and not sure why this is happening but agree endocrinologist should take a look. Pt was called to see if ok with referral. increae testosterone to 222mg  every 2 weeks in the meantime.

## 2016-07-22 NOTE — Patient Instructions (Signed)
Increase to 225mg  every 2 weeks of testosterone.

## 2016-07-25 NOTE — Telephone Encounter (Signed)
Call pt: Let him I know I discussed low testosterone despite supplementation and hypothyroidism despite fairly high supplementation. My partner agrees that she doesn't know why this is happening but it doesn't make sense and recommends endocrinologist referral. Are you ok with this?

## 2016-08-01 ENCOUNTER — Ambulatory Visit: Payer: Self-pay | Admitting: Physician Assistant

## 2016-08-01 NOTE — Telephone Encounter (Signed)
LMOM for pt to call the office back regarding Endocrinology referral.

## 2016-08-23 LAB — TSH: TSH: 1.07 mIU/L (ref 0.40–4.50)

## 2016-09-14 ENCOUNTER — Other Ambulatory Visit: Payer: Self-pay | Admitting: Physician Assistant

## 2016-09-14 DIAGNOSIS — E039 Hypothyroidism, unspecified: Secondary | ICD-10-CM

## 2016-09-14 DIAGNOSIS — E291 Testicular hypofunction: Secondary | ICD-10-CM

## 2016-09-14 DIAGNOSIS — F900 Attention-deficit hyperactivity disorder, predominantly inattentive type: Secondary | ICD-10-CM

## 2016-09-27 ENCOUNTER — Other Ambulatory Visit: Payer: Self-pay

## 2016-09-27 DIAGNOSIS — F900 Attention-deficit hyperactivity disorder, predominantly inattentive type: Secondary | ICD-10-CM

## 2016-09-28 ENCOUNTER — Other Ambulatory Visit: Payer: Self-pay

## 2016-09-28 DIAGNOSIS — F900 Attention-deficit hyperactivity disorder, predominantly inattentive type: Secondary | ICD-10-CM

## 2016-09-28 DIAGNOSIS — F419 Anxiety disorder, unspecified: Secondary | ICD-10-CM

## 2016-09-28 MED ORDER — CLONAZEPAM 0.5 MG PO TABS: 0.5000 mg | ORAL_TABLET | Freq: Every evening | ORAL | 0 refills | Status: DC | PRN

## 2016-09-28 MED ORDER — LISDEXAMFETAMINE DIMESYLATE 60 MG PO CAPS: 60.0000 mg | ORAL_CAPSULE | ORAL | 0 refills | Status: DC

## 2016-09-28 NOTE — Telephone Encounter (Signed)
Patient request refill for Vyanse 60 mg. #30 0 refills and Clonazepam 0.5 mg. #30. Rhonda Cunningham,CMA

## 2016-11-15 ENCOUNTER — Other Ambulatory Visit: Payer: Self-pay | Admitting: Physician Assistant

## 2016-11-15 ENCOUNTER — Encounter: Payer: Self-pay | Admitting: Physician Assistant

## 2016-11-15 ENCOUNTER — Ambulatory Visit (INDEPENDENT_AMBULATORY_CARE_PROVIDER_SITE_OTHER): Payer: Managed Care, Other (non HMO) | Admitting: Physician Assistant

## 2016-11-15 DIAGNOSIS — E039 Hypothyroidism, unspecified: Secondary | ICD-10-CM

## 2016-11-15 DIAGNOSIS — N529 Male erectile dysfunction, unspecified: Secondary | ICD-10-CM

## 2016-11-15 DIAGNOSIS — F419 Anxiety disorder, unspecified: Secondary | ICD-10-CM

## 2016-11-15 DIAGNOSIS — F900 Attention-deficit hyperactivity disorder, predominantly inattentive type: Secondary | ICD-10-CM | POA: Diagnosis not present

## 2016-11-15 MED ORDER — LISDEXAMFETAMINE DIMESYLATE 60 MG PO CAPS: 60.0000 mg | ORAL_CAPSULE | ORAL | 0 refills | Status: DC

## 2016-11-15 MED ORDER — LEVOTHYROXINE SODIUM 50 MCG PO TABS: ORAL_TABLET | ORAL | 0 refills | Status: DC

## 2016-11-15 MED ORDER — SILDENAFIL CITRATE 20 MG PO TABS: ORAL_TABLET | ORAL | 5 refills | Status: DC

## 2016-11-15 MED ORDER — LEVOTHYROXINE SODIUM 200 MCG PO TABS: ORAL_TABLET | ORAL | 0 refills | Status: DC

## 2016-11-15 MED ORDER — CLONAZEPAM 0.5 MG PO TABS: 0.5000 mg | ORAL_TABLET | Freq: Every evening | ORAL | 0 refills | Status: DC | PRN

## 2016-11-15 NOTE — Progress Notes (Signed)
Subjective:    Patient ID: Wesley Mckay, male    DOB: 07/03/1980, 36 y.o.   MRN: [id number]  HPI Pt is a 36 yo male who presents to the clinic for medication follow up.   ADHD-doing great on vyvanse. No problems or concerns. He is sleeping well but not long enough. He has started back working and doing well. He does have to get up early which is does not like but starting to adjust a little more.   Hypothyroidism-doing good. Taking medications daily.   ED- doing well. Needs refills.   Anxiety- he is not taking klonapin daily but about once or twice a week. Needs refills.   .. Active Ambulatory Problems    Diagnosis Date Noted  . Hypothyroidism 08/01/2007  . GERD 08/01/2007  . SEIZURE DISORDER 08/01/2007  . INSOMNIA 08/01/2007  . Anxiety disorder 10/04/2010  . 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 Bilateral foot pain 08/16/2012  . Bilateral plantar fasciitis 11/14/2014  . Inattention 11/17/2014  . No energy 11/17/2014  . Hyperlipidemia LDL goal <130 11/18/2014  . Attention deficit hyperactivity disorder (ADHD), predominantly inattentive type 12/19/2014  . Major depressive disorder, recurrent episode, moderate ([place]) 12/19/2014  . Vitamin B12 deficiency 09/15/2015  . Sinus bradycardia 09/15/2015  . Seizure disorder ([place]) 02/29/2016  . Erectile dysfunction 02/29/2016  . Vitamin D deficiency 04/11/2016  . Anxiety 11/17/2016   Resolved Ambulatory Problems    Diagnosis Date Noted  . FOOT PAIN, RIGHT 11/29/2007   Past Medical History:  Diagnosis Date  . GERD (gastroesophageal reflux disease)   . History of dislocation of shoulder   . Insomnia   . Obesity   . Seizures ([place]) 03/2007  . Thyroid disease       Review of Systems  All other systems reviewed and are negative.      Objective:   Physical Exam  Constitutional: He is oriented to person, place, and time. He appears well-developed and well-nourished.  HENT:  Head: Normocephalic and atraumatic.   Cardiovascular: Normal rate, regular rhythm and normal heart sounds.   Pulmonary/Chest: Effort normal and breath sounds normal.  Neurological: He is alert and oriented to person, place, and time. Coordination normal.  Psychiatric: He has a normal mood and affect. His behavior is normal.          Assessment & Plan:  Marland KitchenMarland KitchenShaunte was seen today for hypothyroidism and adhd.  Diagnoses and all orders for this visit:  Attention deficit hyperactivity disorder (ADHD), predominantly inattentive type -     Discontinue: lisdexamfetamine (VYVANSE) 60 MG capsule; Take 1 capsule (60 mg total) by mouth every morning. -     Discontinue: lisdexamfetamine (VYVANSE) 60 MG capsule; Take 1 capsule (60 mg total) by mouth every morning. -     lisdexamfetamine (VYVANSE) 60 MG capsule; Take 1 capsule (60 mg total) by mouth every morning.  Hypothyroidism, unspecified type -     levothyroxine (SYNTHROID, LEVOTHROID) 200 MCG tablet; TAKE 1 TABLET (200 MCG TOTAL) BY MOUTH DAILY. -     levothyroxine (SYNTHROID, LEVOTHROID) 50 MCG tablet; TAKE ONE TABLET BY MOUTH ONCE DAILY BEFORE BREAKFAST  Erectile dysfunction, unspecified erectile dysfunction type -     sildenafil (REVATIO) 20 MG tablet; TAKE 1-5 TABLETS BY MOUTH AS NEEDED  Anxiety -     clonazePAM (KLONOPIN) 0.5 MG tablet; Take 1 tablet (0.5 mg total) by mouth at bedtime as needed.   Will do labs at next visit.   Discussed abuse potential of klonapin and to only use  as needed.

## 2016-11-17 ENCOUNTER — Encounter: Payer: Self-pay | Admitting: Physician Assistant

## 2016-11-17 DIAGNOSIS — F419 Anxiety disorder, unspecified: Secondary | ICD-10-CM | POA: Insufficient documentation

## 2017-02-01 ENCOUNTER — Other Ambulatory Visit: Payer: Self-pay | Admitting: *Deleted

## 2017-02-01 DIAGNOSIS — E291 Testicular hypofunction: Secondary | ICD-10-CM

## 2017-02-01 MED ORDER — TESTOSTERONE CYPIONATE 200 MG/ML IM SOLN: 200.0000 mg | INTRAMUSCULAR | 0 refills | Status: DC

## 2017-02-17 ENCOUNTER — Ambulatory Visit: Payer: Self-pay | Admitting: Physician Assistant

## 2017-02-20 ENCOUNTER — Ambulatory Visit: Payer: Managed Care, Other (non HMO) | Admitting: Physician Assistant

## 2017-02-20 DIAGNOSIS — Z0189 Encounter for other specified special examinations: Secondary | ICD-10-CM

## 2017-03-03 ENCOUNTER — Ambulatory Visit: Payer: Managed Care, Other (non HMO) | Admitting: Physician Assistant

## 2017-03-03 ENCOUNTER — Encounter: Payer: Self-pay | Admitting: Physician Assistant

## 2017-03-03 VITALS — BP 117/71 | HR 61 | Ht 75.0 in | Wt 258.0 lb

## 2017-03-03 DIAGNOSIS — E291 Testicular hypofunction: Secondary | ICD-10-CM

## 2017-03-03 DIAGNOSIS — F5101 Primary insomnia: Secondary | ICD-10-CM

## 2017-03-03 DIAGNOSIS — E039 Hypothyroidism, unspecified: Secondary | ICD-10-CM

## 2017-03-03 DIAGNOSIS — F902 Attention-deficit hyperactivity disorder, combined type: Secondary | ICD-10-CM

## 2017-03-03 MED ORDER — LISDEXAMFETAMINE DIMESYLATE 70 MG PO CAPS: 70.0000 mg | ORAL_CAPSULE | Freq: Every day | ORAL | 0 refills | Status: DC

## 2017-03-03 MED ORDER — CLONIDINE HCL 0.1 MG PO TABS: 0.1000 mg | ORAL_TABLET | Freq: Every day | ORAL | 5 refills | Status: DC

## 2017-03-03 MED ORDER — TESTOSTERONE CYPIONATE 200 MG/ML IM SOLN: 300.0000 mg | INTRAMUSCULAR | 5 refills | Status: DC

## 2017-03-03 NOTE — Progress Notes (Addendum)
Subjective:    Patient ID: Wesley Mckay, male    DOB: [birth year], 37 y.o.   MRN: [id number]  HPI  Pt is a 37 year old male presenting to the clinic for a follow up on his ADHD and vyvanse presentation. He reports that he feels like his vyvanse has been less effective and he has had difficulty focusing in the past few months. He does report increase activity and stress due to his father passing away in December which may have affected things. He denies palpitations or chest pain. He denies changes in sleep from the normal and some decreased appetite.  He reports he has not slept well in the past 30 years and is interested in trying clonidine for sleep. He may consider a home sleep apnea test. He also inquired about changing the vial that is distributed for his Testosterone because it was not lasting long enough. He inquired about testosterone pellets to see if that is something we offered.     .. Active Ambulatory Problems    Diagnosis Date Noted  . Hypothyroidism 08/01/2007  . GERD 08/01/2007  . SEIZURE DISORDER 08/01/2007  . Primary insomnia 08/01/2007  . Anxiety disorder 10/04/2010  . 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 Bilateral foot pain 08/16/2012  . Bilateral plantar fasciitis 11/14/2014  . Inattention 11/17/2014  . No energy 11/17/2014  . Hyperlipidemia LDL goal <130 11/18/2014  . Attention deficit hyperactivity disorder (ADHD), predominantly inattentive type 12/19/2014  . Major depressive disorder, recurrent episode, moderate ([place]) 12/19/2014  . Vitamin B12 deficiency 09/15/2015  . Sinus bradycardia 09/15/2015  . Seizure disorder ([place]) 02/29/2016  . Erectile dysfunction 02/29/2016  . Vitamin D deficiency 04/11/2016  . Anxiety 11/17/2016   Resolved Ambulatory Problems    Diagnosis Date Noted  . FOOT PAIN, RIGHT 11/29/2007   Past Medical History:  Diagnosis Date  . GERD (gastroesophageal reflux disease)   . History of dislocation of shoulder   . Insomnia   .  Obesity   . Seizures ([place]) 03/2007  . Thyroid disease       Review of Systems  Constitutional: Negative for appetite change, fatigue, fever and unexpected weight change.  Cardiovascular: Negative for chest pain and palpitations.  Gastrointestinal: Negative for constipation and diarrhea.  Neurological: Negative for headaches.       Objective:   Physical Exam  Constitutional: He is oriented to person, place, and time. He appears well-developed and well-nourished. No distress.  HENT:  Head: Normocephalic and atraumatic.  Cardiovascular: Normal rate, regular rhythm and normal heart sounds. Exam reveals no gallop and no friction rub.  No murmur heard. Pulmonary/Chest: Effort normal and breath sounds normal.  Neurological: He is alert and oriented to person, place, and time.  Psychiatric: He has a normal mood and affect. His behavior is normal.   Vitals:   03/03/17 1109  BP: 117/71  Pulse: 61       Assessment & Plan:   Caston was seen today for adhd.  Diagnoses and all orders for this visit:  Acquired hypothyroidism -     TSH  Male hypogonadism -     testosterone cypionate (DEPOTESTOSTERONE CYPIONATE) 200 MG/ML injection; Inject 1.5 mLs (300 mg total) into the muscle every 14 (fourteen) days. -     Testosterone -     PSA -     CBC -     COMPLETE METABOLIC PANEL WITH GFR  Primary insomnia -     cloNIDine (CATAPRES) 0.1 MG tablet; Take 1 tablet (0.1  mg total) by mouth at bedtime.  Attention deficit hyperactivity disorder (ADHD), combined type -     lisdexamfetamine (VYVANSE) 70 MG capsule; Take 1 capsule (70 mg total) by mouth daily.   [name] was seen today for follow up on his ADHD medication and sleep troubles. We will increase his vyvanse to 70mg  due to his difficulty with focusing. We are also starting clonidine to try to help produce restful sleep. If he is interested we will send a referral for a sleep study to investigate the possibility of sleep apnea. The  testosterone prescription has been adjusted to ensure he is getting an adequate supply of medication to coincide with the previous dose increase. He will get follow up labs next Friday 03/10/17 which is one week after his last testosterone injection ( administered on 03/02/17). He is encouraged to contact the office or my through myChart with questions or concerns.

## 2017-03-08 ENCOUNTER — Other Ambulatory Visit: Payer: Self-pay | Admitting: Physician Assistant

## 2017-03-08 DIAGNOSIS — F902 Attention-deficit hyperactivity disorder, combined type: Secondary | ICD-10-CM

## 2017-03-08 DIAGNOSIS — F419 Anxiety disorder, unspecified: Secondary | ICD-10-CM

## 2017-03-08 DIAGNOSIS — F5101 Primary insomnia: Secondary | ICD-10-CM

## 2017-03-08 MED ORDER — CLONAZEPAM 0.5 MG PO TABS: 0.5000 mg | ORAL_TABLET | Freq: Every evening | ORAL | 5 refills | Status: DC | PRN

## 2017-03-08 MED ORDER — LISDEXAMFETAMINE DIMESYLATE 70 MG PO CAPS: 70.0000 mg | ORAL_CAPSULE | Freq: Every day | ORAL | 0 refills | Status: DC

## 2017-03-08 MED ORDER — CLONIDINE HCL 0.1 MG PO TABS: 0.1000 mg | ORAL_TABLET | Freq: Every day | ORAL | 5 refills | Status: DC

## 2017-03-08 NOTE — Progress Notes (Signed)
.  h 

## 2017-03-08 NOTE — Progress Notes (Signed)
EMR up and running sent refills to appropriate

## 2017-04-05 ENCOUNTER — Other Ambulatory Visit: Payer: Self-pay | Admitting: Physician Assistant

## 2017-04-05 DIAGNOSIS — E039 Hypothyroidism, unspecified: Secondary | ICD-10-CM

## 2017-04-05 DIAGNOSIS — F902 Attention-deficit hyperactivity disorder, combined type: Secondary | ICD-10-CM

## 2017-05-03 ENCOUNTER — Other Ambulatory Visit: Payer: Self-pay | Admitting: Physician Assistant

## 2017-05-03 DIAGNOSIS — E291 Testicular hypofunction: Secondary | ICD-10-CM

## 2017-05-03 NOTE — Progress Notes (Signed)
Pt interested in pellet testosterone replacement and I am not familiar with this. Will make referral.

## 2017-05-09 ENCOUNTER — Other Ambulatory Visit: Payer: Self-pay | Admitting: *Deleted

## 2017-05-09 MED ORDER — OMEPRAZOLE 40 MG PO CPDR: DELAYED_RELEASE_CAPSULE | ORAL | 11 refills | Status: DC

## 2017-05-10 ENCOUNTER — Other Ambulatory Visit: Payer: Self-pay | Admitting: Physician Assistant

## 2017-05-10 DIAGNOSIS — F902 Attention-deficit hyperactivity disorder, combined type: Secondary | ICD-10-CM

## 2017-05-12 MED ORDER — LISDEXAMFETAMINE DIMESYLATE 70 MG PO CAPS: 70.0000 mg | ORAL_CAPSULE | Freq: Every day | ORAL | 0 refills | Status: DC

## 2017-05-12 NOTE — Telephone Encounter (Signed)
Pt requesting RF on Vyvanse.  Please review RF and send if appropriate.  Thanks!

## 2017-05-18 ENCOUNTER — Encounter: Payer: Managed Care, Other (non HMO) | Admitting: Physician Assistant

## 2017-05-18 DIAGNOSIS — Z0189 Encounter for other specified special examinations: Secondary | ICD-10-CM

## 2017-06-02 ENCOUNTER — Ambulatory Visit: Payer: Self-pay | Admitting: Physician Assistant

## 2017-06-08 ENCOUNTER — Other Ambulatory Visit: Payer: Self-pay | Admitting: Physician Assistant

## 2017-06-08 DIAGNOSIS — F902 Attention-deficit hyperactivity disorder, combined type: Secondary | ICD-10-CM

## 2017-06-20 ENCOUNTER — Other Ambulatory Visit: Payer: Self-pay | Admitting: Physician Assistant

## 2017-06-20 DIAGNOSIS — F902 Attention-deficit hyperactivity disorder, combined type: Secondary | ICD-10-CM

## 2017-06-29 ENCOUNTER — Other Ambulatory Visit: Payer: Self-pay | Admitting: Physician Assistant

## 2017-06-29 DIAGNOSIS — F902 Attention-deficit hyperactivity disorder, combined type: Secondary | ICD-10-CM

## 2017-07-12 ENCOUNTER — Ambulatory Visit (INDEPENDENT_AMBULATORY_CARE_PROVIDER_SITE_OTHER): Payer: Managed Care, Other (non HMO) | Admitting: Physician Assistant

## 2017-07-12 ENCOUNTER — Encounter: Payer: Self-pay | Admitting: Physician Assistant

## 2017-07-12 ENCOUNTER — Ambulatory Visit (INDEPENDENT_AMBULATORY_CARE_PROVIDER_SITE_OTHER): Payer: Managed Care, Other (non HMO)

## 2017-07-12 VITALS — BP 136/69 | HR 60 | Ht 75.0 in | Wt 258.0 lb

## 2017-07-12 DIAGNOSIS — E291 Testicular hypofunction: Secondary | ICD-10-CM | POA: Diagnosis not present

## 2017-07-12 DIAGNOSIS — K2100 Gastro-esophageal reflux disease with esophagitis, without bleeding: Secondary | ICD-10-CM

## 2017-07-12 DIAGNOSIS — M542 Cervicalgia: Secondary | ICD-10-CM

## 2017-07-12 DIAGNOSIS — M4802 Spinal stenosis, cervical region: Secondary | ICD-10-CM | POA: Diagnosis not present

## 2017-07-12 DIAGNOSIS — E039 Hypothyroidism, unspecified: Secondary | ICD-10-CM

## 2017-07-12 DIAGNOSIS — F902 Attention-deficit hyperactivity disorder, combined type: Secondary | ICD-10-CM

## 2017-07-12 DIAGNOSIS — F419 Anxiety disorder, unspecified: Secondary | ICD-10-CM

## 2017-07-12 DIAGNOSIS — M62838 Other muscle spasm: Secondary | ICD-10-CM | POA: Diagnosis not present

## 2017-07-12 DIAGNOSIS — F5101 Primary insomnia: Secondary | ICD-10-CM

## 2017-07-12 LAB — COMPLETE METABOLIC PANEL WITH GFR
AG Ratio: 1.8 (calc) (ref 1.0–2.5)
ALBUMIN MSPROF: 4.6 g/dL (ref 3.6–5.1)
ALT: 18 U/L (ref 9–46)
AST: 20 U/L (ref 10–40)
Alkaline phosphatase (APISO): 95 U/L (ref 40–115)
BUN: 11 mg/dL (ref 7–25)
CO2: 26 mmol/L (ref 20–32)
CREATININE: 1.04 mg/dL (ref 0.60–1.35)
Calcium: 10.1 mg/dL (ref 8.6–10.3)
Chloride: 104 mmol/L (ref 98–110)
GFR, EST NON AFRICAN AMERICAN: 90 mL/min/{1.73_m2} (ref >=60)
GFR, Est African American: 104 mL/min/{1.73_m2} (ref >=60)
GLOBULIN: 2.6 g/dL (ref 1.9–3.7)
Glucose, Bld: 110 mg/dL — ABNORMAL HIGH (ref 65–99)
Potassium: 4.1 mmol/L (ref 3.5–5.3)
SODIUM: 140 mmol/L (ref 135–146)
Total Bilirubin: 0.5 mg/dL (ref 0.2–1.2)
Total Protein: 7.2 g/dL (ref 6.1–8.1)

## 2017-07-12 LAB — CBC
HEMATOCRIT: 47.2 % (ref 38.5–50.0)
HEMOGLOBIN: 16.4 g/dL (ref 13.2–17.1)
MCH: 29.9 pg (ref 27.0–33.0)
MCHC: 34.7 g/dL (ref 32.0–36.0)
MCV: 86 fL (ref 80.0–100.0)
MPV: 9.7 fL (ref 7.5–12.5)
Platelets: 269 10*3/uL (ref 140–400)
RBC: 5.49 10*6/uL (ref 4.20–5.80)
RDW: 13.5 % (ref 11.0–15.0)
WBC: 9.3 10*3/uL (ref 3.8–10.8)

## 2017-07-12 LAB — PSA: PSA: 0.7 ng/mL (ref <=4.0)

## 2017-07-12 LAB — TESTOSTERONE: Testosterone: 204 ng/dL — ABNORMAL LOW (ref 250–827)

## 2017-07-12 LAB — TSH: TSH: 7.17 mIU/L — ABNORMAL HIGH (ref 0.40–4.50)

## 2017-07-12 IMAGING — DX DG CERVICAL SPINE COMPLETE 4+V
7 series · 7 of 7 positions shown · non-contrast
Comparison: None.

CLINICAL DATA: Acute neck pain and stiffness for 3 weeks. No known
injury. Initial encounter.

EXAM:
CERVICAL SPINE - COMPLETE 4+ VIEW

[c-spine lat]
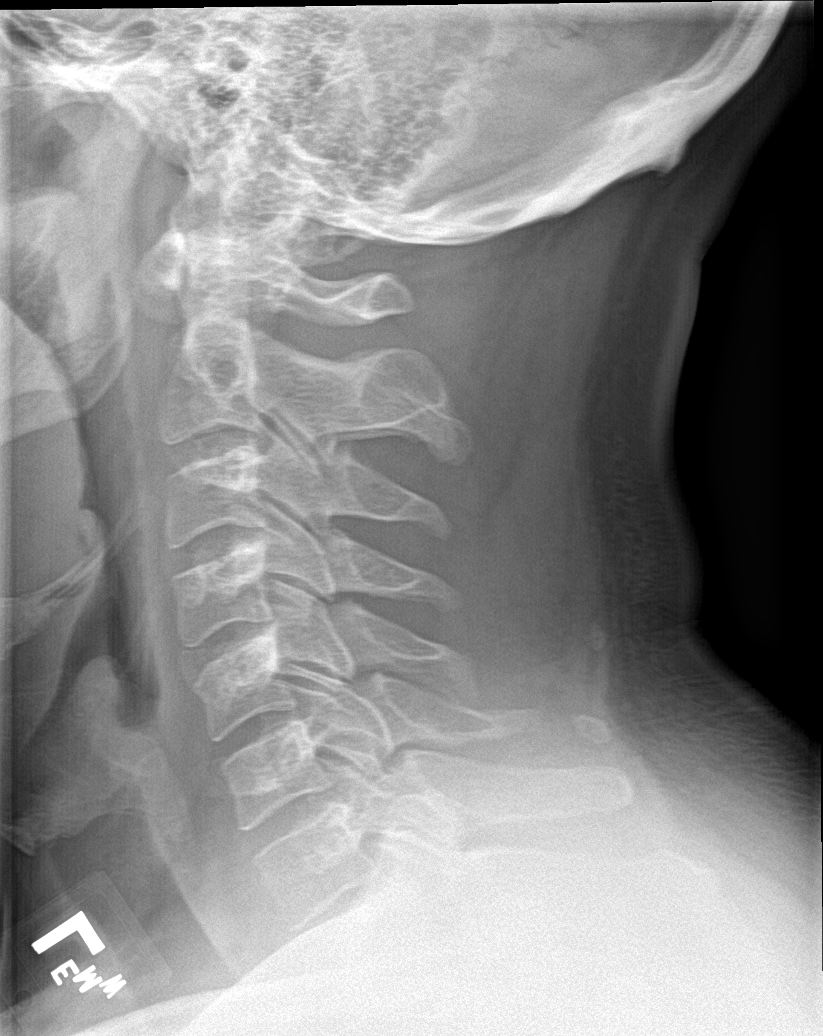

[c-spine obl (1 of 2)]
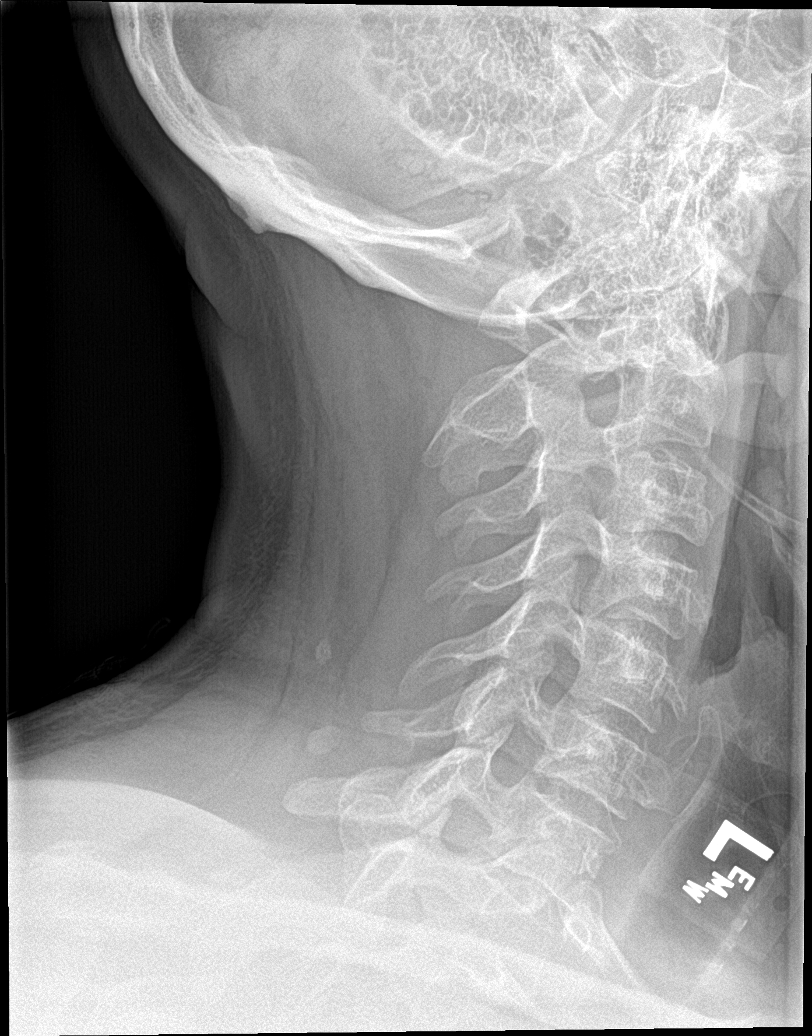

[c-spine obl (2 of 2)]
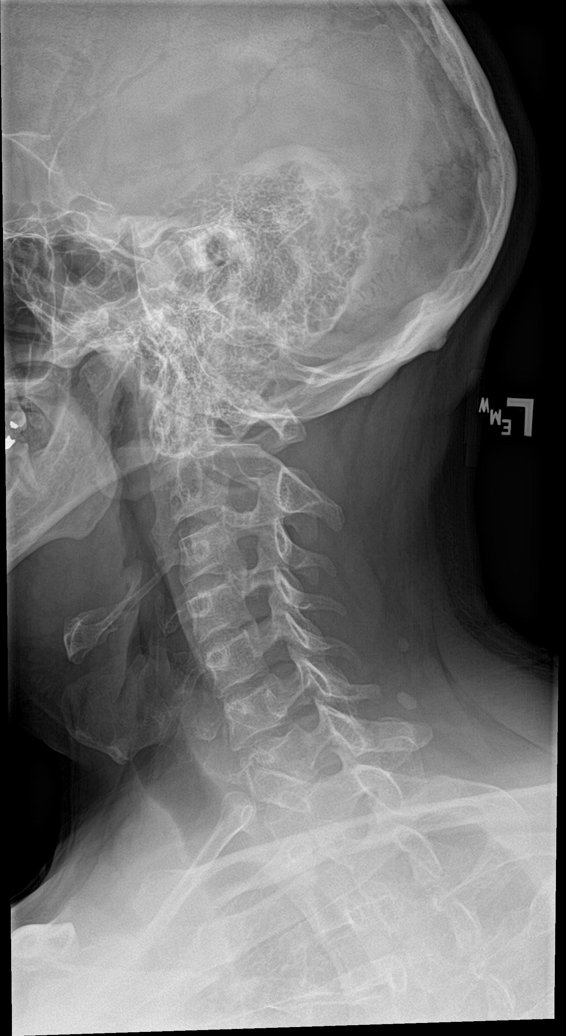

[c-spine ap]
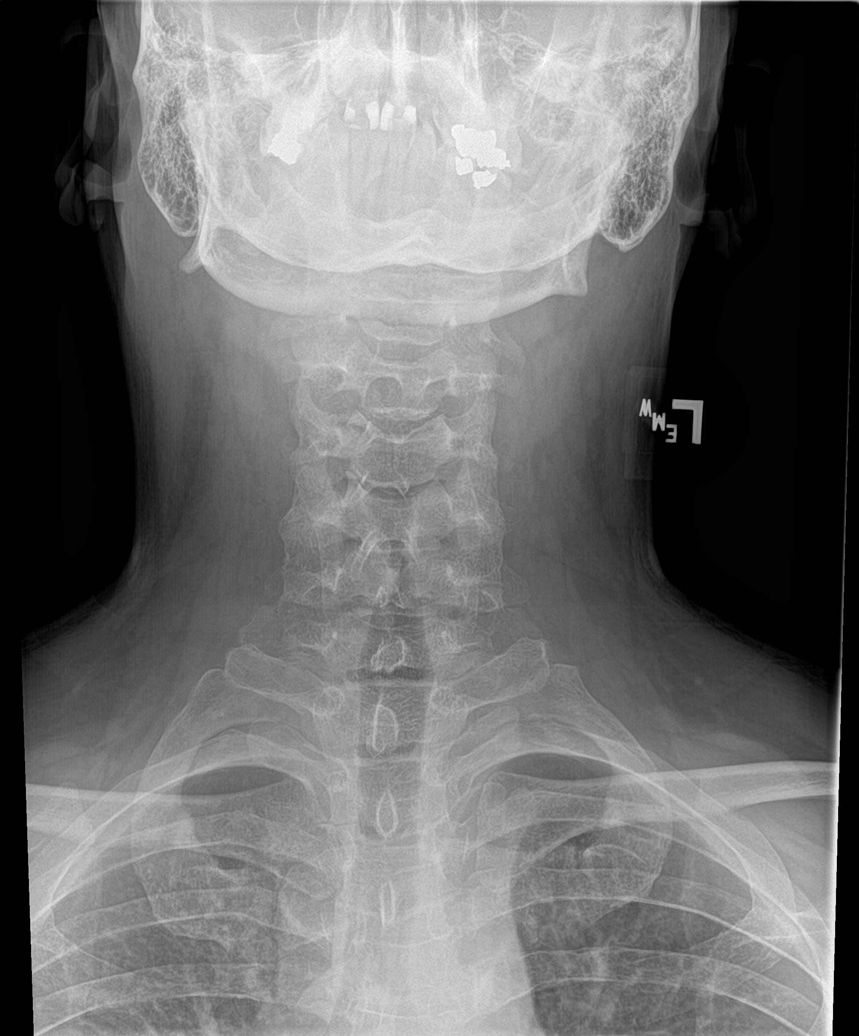

[c-spine open mouth]
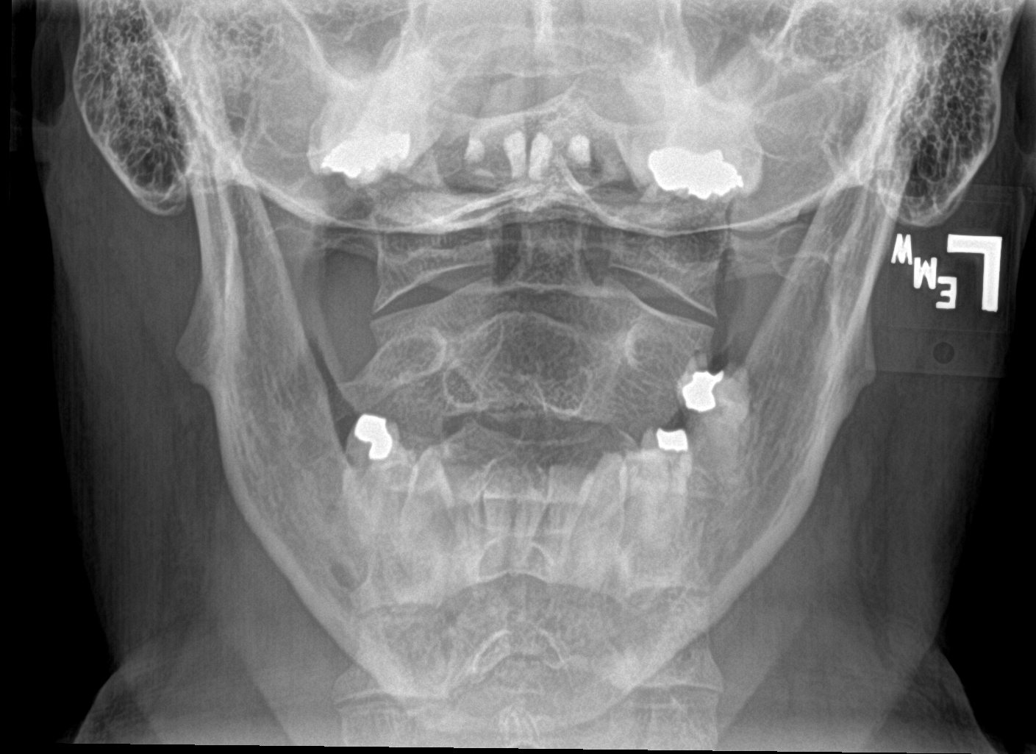

[c-spine swimmers]
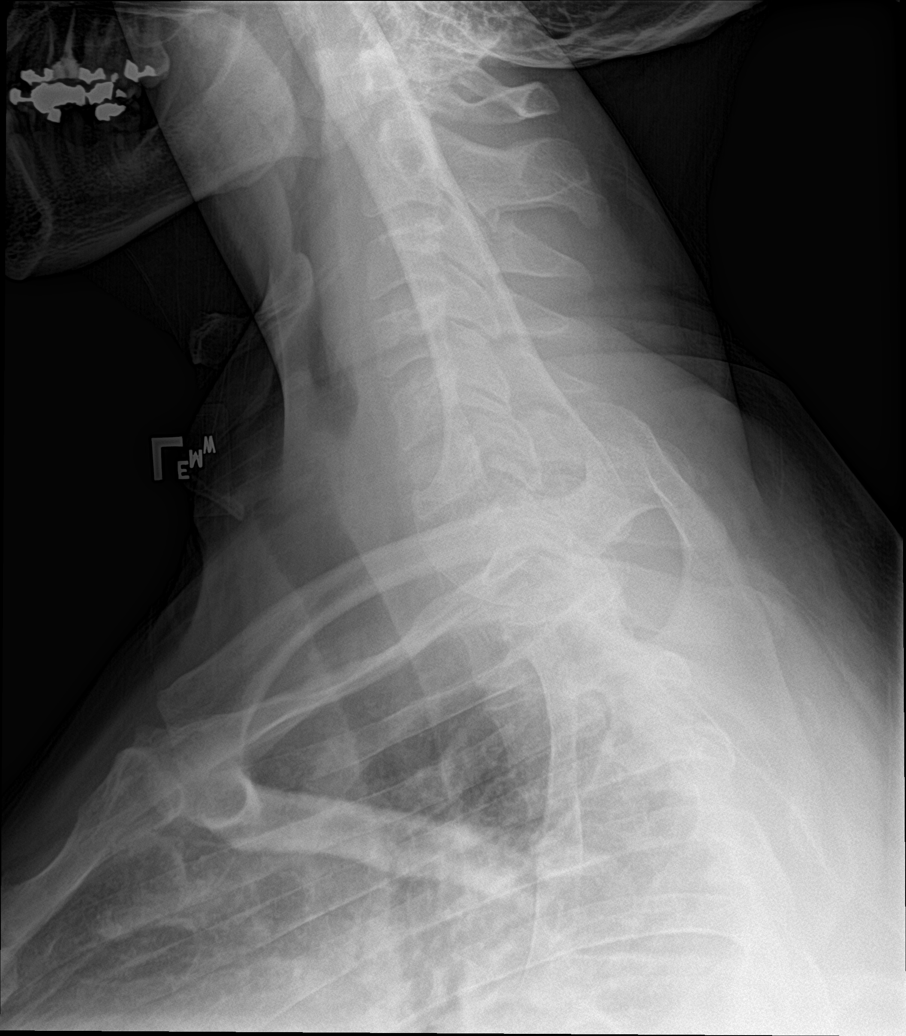

[[person_name]]
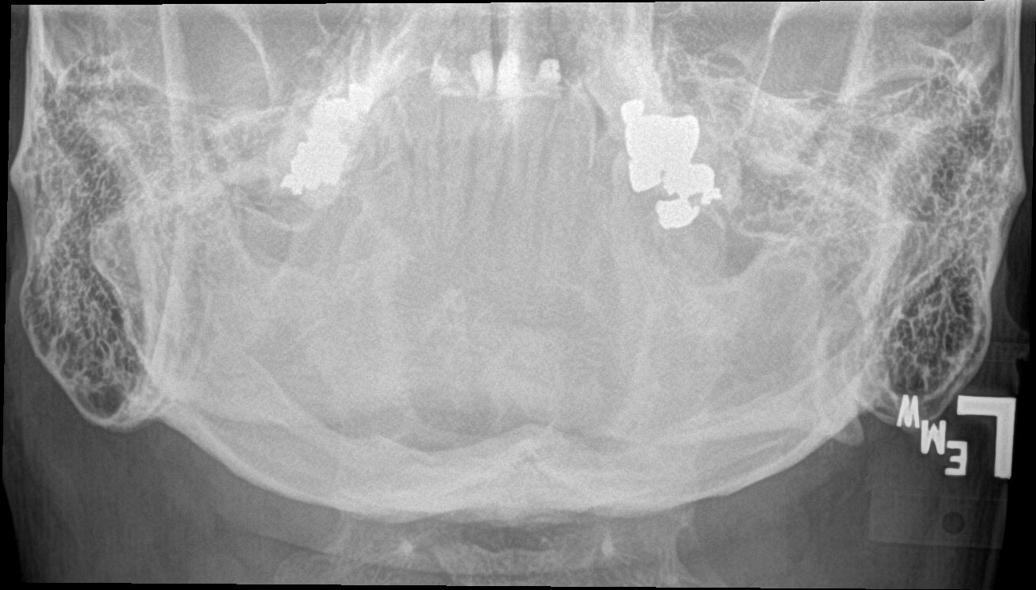

[7 of 7 positions shown; findings below may reference images not displayed]

FINDINGS: Normal alignment noted.

No evidence of fracture, subluxation or dislocation.

Minimal bony foraminal narrowing on the RIGHT at C3-4 and C4-5 and
on the LEFT at C3-4 noted.

No focal bony lesions are present.

Disc spaces are maintained.
IMPRESSION: Minimal bony foraminal narrowing on the RIGHT at C3-4 and C4-5 and
on the LEFT at C3-4. No other significant abnormalities.

## 2017-07-12 MED ORDER — TESTOSTERONE CYPIONATE 200 MG/ML IM SOLN: 300.0000 mg | INTRAMUSCULAR | 5 refills | Status: DC

## 2017-07-12 MED ORDER — LISDEXAMFETAMINE DIMESYLATE 60 MG PO CAPS: 60.0000 mg | ORAL_CAPSULE | ORAL | 0 refills | Status: DC

## 2017-07-12 MED ORDER — LEVOTHYROXINE SODIUM 50 MCG PO TABS: ORAL_TABLET | ORAL | 0 refills | Status: DC

## 2017-07-12 MED ORDER — TESTOSTERONE CYPIONATE 200 MG/ML IM SOLN
300.0000 mg | Freq: Once | INTRAMUSCULAR | Status: AC
  Administered 2017-07-12: 300 mg via INTRAMUSCULAR

## 2017-07-12 MED ORDER — CLONAZEPAM 0.5 MG PO TABS: 0.5000 mg | ORAL_TABLET | Freq: Every evening | ORAL | 5 refills | Status: DC | PRN

## 2017-07-12 MED ORDER — DEXLANSOPRAZOLE 60 MG PO CPDR
60.0000 mg | DELAYED_RELEASE_CAPSULE | Freq: Every day | ORAL | 4 refills | Status: DC
Start: 2017-07-12 — End: 2018-07-30

## 2017-07-12 MED ORDER — CLONIDINE HCL 0.1 MG PO TABS: 0.1000 mg | ORAL_TABLET | Freq: Every day | ORAL | 5 refills | Status: DC

## 2017-07-12 MED ORDER — LEVOTHYROXINE SODIUM 200 MCG PO TABS: ORAL_TABLET | ORAL | 0 refills | Status: DC

## 2017-07-12 MED ORDER — CYCLOBENZAPRINE HCL 10 MG PO TABS: 10.0000 mg | ORAL_TABLET | Freq: Three times a day (TID) | ORAL | 1 refills | Status: DC | PRN

## 2017-07-12 NOTE — Progress Notes (Signed)
Discussed with patient in office at todays appt. Not taking thyroid medication every day. Stay on same dose. Not taking testosterone like he should. Stay on same dose.

## 2017-07-12 NOTE — Progress Notes (Signed)
Subjective:    Patient ID: Wesley Mckay, male    DOB: Dec 01, 1980, 37 y.o.   MRN: [id number]  HPI  Pt is a 37 yo pleasant male who presents to the clinic for follow up.   Hypothyroidism- forgetting dose a lot in the morning. He has to take prilosec twice a day. No headache, fever, or chills.   He is using his testosterone shots. He only taking every 3 weeks due ot not having enough medication.   Pt is having a lot of neck pain for the last 3 months. He lifts a lot at work. No known injury. He is taking ibuprofen 4 times a day, using tenns units.   ADHD- doing well during the day but 70mg  keeps him up at night. he would like to decrease.   .. Active Ambulatory Problems    Diagnosis Date Noted  . Hypothyroidism 08/01/2007  . GERD 08/01/2007  . SEIZURE DISORDER 08/01/2007  . Primary insomnia 08/01/2007  . Anxiety disorder 10/04/2010  . 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 Bilateral foot pain 08/16/2012  . Bilateral plantar fasciitis 11/14/2014  . Inattention 11/17/2014  . No energy 11/17/2014  . Hyperlipidemia LDL goal <130 11/18/2014  . Attention deficit hyperactivity disorder (ADHD), predominantly inattentive type 12/19/2014  . Major depressive disorder, recurrent episode, moderate ([place]) 12/19/2014  . Vitamin B12 deficiency 09/15/2015  . Sinus bradycardia 09/15/2015  . Seizure disorder ([place]) 02/29/2016  . Erectile dysfunction 02/29/2016  . Vitamin D deficiency 04/11/2016  . Anxiety 11/17/2016  . Muscle spasm 07/14/2017  . Neck pain 07/14/2017  . Attention deficit hyperactivity disorder (ADHD), combined type 07/14/2017   Resolved Ambulatory Problems    Diagnosis Date Noted  . FOOT PAIN, RIGHT 11/29/2007   Past Medical History:  Diagnosis Date  . GERD (gastroesophageal reflux disease)   . History of dislocation of shoulder   . Insomnia   . Obesity   . Seizures ([place]) 03/2007  . Thyroid disease       Review of Systems See HPI.     Objective:   Physical Exam   Constitutional: He is oriented to person, place, and time. He appears well-developed and well-nourished.  HENT:  Head: Normocephalic and atraumatic.  Cardiovascular: Normal rate and regular rhythm.  Pulmonary/Chest: Effort normal and breath sounds normal.  Musculoskeletal:  Neck ROM normal.  Tightness and tenderness over upper back paraspinal muscles.  No tenderness over cspine.  Negative spurlings. Upper ext strength 5/5.   Neurological: He is alert and oriented to person, place, and time.  Psychiatric: He has a normal mood and affect. His behavior is normal.          Assessment & Plan:  Marland KitchenMarland KitchenBion was seen today for gastroesophageal reflux, neck pain and hypogonadism.  Diagnoses and all orders for this visit:  Attention deficit hyperactivity disorder (ADHD), combined type -     lisdexamfetamine (VYVANSE) 60 MG capsule; Take 1 capsule (60 mg total) by mouth every morning. -     lisdexamfetamine (VYVANSE) 60 MG capsule; Take 1 capsule (60 mg total) by mouth every morning. -     lisdexamfetamine (VYVANSE) 60 MG capsule; Take 1 capsule (60 mg total) by mouth every morning.  Male hypogonadism -     testosterone cypionate (DEPOTESTOSTERONE CYPIONATE) 200 MG/ML injection; Inject 1.5 mLs (300 mg total) into the muscle every 14 (fourteen) days. -     testosterone cypionate (DEPOTESTOSTERONE CYPIONATE) injection 300 mg  Hypothyroidism, unspecified type -     levothyroxine (SYNTHROID, LEVOTHROID) 200  MCG tablet; Take one tablet (200 mcg total) by mouth daily. -     levothyroxine (SYNTHROID, LEVOTHROID) 50 MCG tablet; Take one tablet (50 mcg total) by mouth once daily before breakfast.  Anxiety -     clonazePAM (KLONOPIN) 0.5 MG tablet; Take 1 tablet (0.5 mg total) by mouth at bedtime as needed.  Primary insomnia -     cloNIDine (CATAPRES) 0.1 MG tablet; Take 1 tablet (0.1 mg total) by mouth at bedtime.  Neck pain -     DG Cervical Spine Complete -     cyclobenzaprine (FLEXERIL) 10  MG tablet; Take 1 tablet (10 mg total) by mouth 3 (three) times daily as needed for muscle spasms.  Muscle spasm -     DG Cervical Spine Complete -     cyclobenzaprine (FLEXERIL) 10 MG tablet; Take 1 tablet (10 mg total) by mouth 3 (three) times daily as needed for muscle spasms.  Gastroesophageal reflux disease with esophagitis -     dexlansoprazole (DEXILANT) 60 MG capsule; Take 1 capsule (60 mg total) by mouth daily.   Xray ordered for neck pain. Given flexeril as needed. Ordered PT to start. Use biofreeze. Consider massages follow up as needed or in 2 months.   Refilled testosterone. Have labs checked in 2 months.   Will see if switching to dexilant will help with compliance with levothyroxine since can take dexilant anytime of the day.   Decreased vyvanse just a little. Will see if helps him get to sleep a little better. Discussed unisom. Discussed good sleep hygiene.

## 2017-07-12 NOTE — Patient Instructions (Signed)
Recheck labs in 2 months.  Order PT.  Flexeril as needed for muscle spams.  I LOVE BIOFREEZE. Heat as often as you can. Consider massages.  Follow up in 3 months.

## 2017-07-13 NOTE — Progress Notes (Signed)
Call pt: mild degenerative changes at C3/4 C4/5.treatment plan stays the same for now.

## 2017-07-14 ENCOUNTER — Encounter: Payer: Self-pay | Admitting: Physician Assistant

## 2017-07-14 DIAGNOSIS — F902 Attention-deficit hyperactivity disorder, combined type: Secondary | ICD-10-CM | POA: Insufficient documentation

## 2017-07-14 DIAGNOSIS — M542 Cervicalgia: Secondary | ICD-10-CM | POA: Insufficient documentation

## 2017-07-14 DIAGNOSIS — M62838 Other muscle spasm: Secondary | ICD-10-CM | POA: Insufficient documentation

## 2017-07-19 ENCOUNTER — Encounter: Payer: Self-pay | Admitting: Physician Assistant

## 2017-07-20 MED ORDER — OMEPRAZOLE 40 MG PO CPDR: 40.0000 mg | DELAYED_RELEASE_CAPSULE | Freq: Two times a day (BID) | ORAL | 3 refills | Status: DC

## 2017-07-20 NOTE — Addendum Note (Signed)
Addended by: Donella Stade on: 07/20/2017 10:39 AM   Modules accepted: Orders

## 2017-07-27 ENCOUNTER — Ambulatory Visit: Payer: Self-pay | Admitting: Rehabilitative and Restorative Service Providers"

## 2017-08-08 ENCOUNTER — Telehealth: Payer: Self-pay | Admitting: Physician Assistant

## 2017-08-08 MED ORDER — ZOLPIDEM TARTRATE 10 MG PO TABS: 10.0000 mg | ORAL_TABLET | Freq: Every evening | ORAL | 1 refills | Status: DC | PRN

## 2017-08-08 NOTE — Telephone Encounter (Signed)
[place] calls and was wanting to know if you had sent Ambien for this patient.   I called patient because there is nothing in his chart stating you agreed to give him this medication or if he had ever been on it.  Pt states that his wife was here for appointment and she asked you if he could have the Ambien as he is not sleeping well at all and he stated " she said yes she would send it".  I informed him that that was not appropriate for her to ask for a medication at her appointment for you and that that was why no med was the pharmacy.  Please advise what you would like to do. KG LPN

## 2017-08-08 NOTE — Telephone Encounter (Signed)
I did talk to his wife and said I would send Ambien. I sent and can follow up in 2 months.

## 2017-08-23 ENCOUNTER — Ambulatory Visit (INDEPENDENT_AMBULATORY_CARE_PROVIDER_SITE_OTHER): Payer: Managed Care, Other (non HMO) | Admitting: Physical Therapy

## 2017-08-23 ENCOUNTER — Encounter: Payer: Self-pay | Admitting: Physical Therapy

## 2017-08-23 DIAGNOSIS — M542 Cervicalgia: Secondary | ICD-10-CM

## 2017-08-23 DIAGNOSIS — M62838 Other muscle spasm: Secondary | ICD-10-CM

## 2017-08-23 DIAGNOSIS — G8929 Other chronic pain: Secondary | ICD-10-CM | POA: Diagnosis not present

## 2017-08-23 NOTE — Patient Instructions (Addendum)
Access Code: [facility]  URL: [email or web address]  Date: 08/23/2017  Prepared by: Elsie Ra   Exercises  Seated Levator Scapulae Stretch - 3 sets - 30 hold - 2x daily - 6x weekly  Seated Cervical Sidebending Stretch - 3 sets - 30 hold - 2x daily - 6x weekly  Seated Cervical Retraction - 10 reps - 3 sets - 2x daily - 6x weekly  Doorway Pec Stretch at 90 Degrees Abduction - 3 sets - 30 hold - 2x daily - 6x weekly  Seated Assisted Cervical Rotation with Towel - 10 reps - 3 sets - 2x daily - 6x weekly  Cervical Extension AROM with Strap - 10 reps - 3 sets - 2x daily - 6x weekly   Pt provided illustrated copy

## 2017-08-23 NOTE — Therapy (Signed)
[place], [place], [address] Phone: [phone]   Fax:  [phone]  Physical Therapy Evaluation  Patient Details  Name: Wesley Mckay MRN: [id number] Date of Birth: [birth year] Referring Provider: Iran Planas PA   Encounter Date: 08/23/2017  PT End of Session - 08/23/17 1224    Visit Number  1    Number of Visits  12    Date for PT Re-Evaluation  10/04/17    PT Start Time  1110    PT Stop Time  1200    PT Time Calculation (min)  50 min    Activity Tolerance  Patient tolerated treatment well    Behavior During Therapy  [facility] for tasks assessed/performed       Past Medical History:  Diagnosis Date  . GERD (gastroesophageal reflux disease)   . History of dislocation of shoulder   . Insomnia   . Obesity   . Seizures ([place]) 03/2007  . Thyroid disease    hypothyroidism    Past Surgical History:  Procedure Laterality Date  . SHOULDER SURGERY     Dr. Angelene Giovanni    There were no vitals filed for this visit.   Subjective Assessment - 08/23/17 1202    Subjective  Pt relays chronic neck pain for 10 years that has become worse over the last 3 to 6 months. 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 He does relay pain radiates into his shoulders and back with intermittent N/T in arms. He relays it feels like a crick in his neck.  He relays PA recommended DN and he want to try this    Pertinent History  relays seizure 10 years ago that dislocated his shoulders and fractured his Lt shoulder.    Limitations  Lifting    Diagnostic tests  X rays show min foraminal narrowing Rt C3-5 and Lt C3-4 otherwise negative    Currently in Pain?  Yes    Pain Score  5     Pain Location  Neck    Pain Orientation  Right;Left    Pain Descriptors / Indicators  Aching;Tightness;Constant    Pain Type  Chronic pain    Pain Onset  More than a month ago    Pain Frequency  Constant    Aggravating Factors   anything    Pain Relieving Factors  maybe a little from flexerill         [facility] PT Assessment - 08/23/17 0001      Assessment   Medical Diagnosis  chronic cervical pain with radiculopahty    Referring Provider  Iran Planas PA    Onset Date/Surgical Date  -- Chronic for 10 years, worse over last 3 months    Prior Therapy  none      Precautions   Precautions  None      Balance Screen   Has the patient fallen in the past 6 months  Yes fell off ladder yesterday, denies any injury or other falls    How many times?  1    Has the patient had a decrease in activity level because of a fear of falling?   No    Is the patient reluctant to leave their home because of a fear of falling?   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, heavy lifting    Leisure  concerts      Cognition   Overall Cognitive Status  Within Functional Limits for tasks assessed      Sensation   Light Touch  Appears Intact      Posture/Postural Control   Posture Comments  mild fwd head and rounded shoulders      ROM / Strength   AROM / PROM / Strength  AROM;Strength      AROM   Overall AROM   Deficits    AROM Assessment Site  Shoulder;Cervical    Right/Left Shoulder  -- Bilat flexion 130 deg, Abd 160 deg, others WNL    Cervical Flexion  -- WNL    Cervical Extension  50%    Cervical - Right Side Bend  50%    Cervical - Left Side Bend  50%    Cervical - Right Rotation  75%    Cervical - Left Rotation  75%      Strength   Overall Strength  Within functional limits for tasks performed      Palpation   Palpation comment  tight and tender cervical P.S and UT bilat with active Trigger pts.      Spurling's   Findings  Negative      Distraction Test   Findngs  Negative                Objective measurements completed  on examination: See above findings.      [facility] Adult PT Treatment/Exercise - 08/23/17 0001      Exercises   Exercises  Neck;Shoulder      Neck Exercises: Seated   Neck Retraction  10 reps    Cervical Rotation  10 reps AAROM with towel    Other Seated Exercise  neck ext MWM with towel 10 reps hold 5 sec    Other Seated Exercise  upper trap/levator stretch 30 sec X 1 ea      Modalities   Modalities  Ultrasound      Ultrasound   Ultrasound Location  Neck/UT bilat    Ultrasound Parameters  1.0, 1.0, 8 min    Ultrasound Goals  Pain             PT Education - 08/23/17 1224    Person(s) Educated  Patient    Methods  Explanation;Demonstration;Verbal cues;Handout    Comprehension  Verbalized understanding;Returned demonstration;Need further instruction          PT Long Term Goals - 08/23/17 1239      PT LONG TERM GOAL #1   Title  Pt will be independent and compliant with HEP. (6 weeks 10/04/17)      PT LONG TERM GOAL #2   Title  Pt will improve cervical ROM to [facility]. (6 weeks 10/04/17)      PT LONG TERM GOAL #3   Title  Pt will report overall less than 3/10 pain with all activites. (6 weeks 10/04/17)      PT LONG TERM GOAL #4   Title  Pt will reduce soft tissue restrictions in upper traps and paraspinals from severe to no more than mild to decrease pain and increase funciton. (6 weeks 10/04/17)             Plan - 08/23/17 1225    Clinical Impression Statement  Pt presents with chonic cervical pain and radiculopathy into bilat UE  without known cause of injury. His pain has become progressively worse over last 3 months and he was referred to PT. He has very tight cervical Paraspinals and upper traps bilat with active Trigger points.  He has good overall strength but ROM in neck and shoulders. He has pain and difficulty with reaching, lifting, and turning his neck and will benefit from PT including DN to adress these deficits.     History and Personal Factors relevant  to plan of care:  flat affect, anxiety and depression    Clinical Presentation  Evolving    Clinical Decision Making  Moderate    Rehab Potential  Fair    PT Frequency  2x / week    PT Duration  6 weeks    PT Treatment/Interventions  ADLs/Self Care Home Management;Electrical Stimulation;Cryotherapy;Moist Heat;Iontophoresis 4mg /ml Dexamethasone;Traction;Ultrasound;Neuromuscular re-education;Manual techniques;Passive range of motion;Dry needling    PT Next Visit Plan  review HEP, modalites PRN, neck stretching, DN PRN    Consulted and Agree with Plan of Care  Patient       Patient will benefit from skilled therapeutic intervention in order to improve the following deficits and impairments:  Decreased activity tolerance, Decreased range of motion, Hypomobility, Increased fascial restricitons, Increased muscle spasms, Postural dysfunction, Pain  Visit Diagnosis: Chronic neck pain  Other muscle spasm     Problem List Patient Active Problem List   Diagnosis Date Noted  . Muscle spasm 07/14/2017  . Neck pain 07/14/2017  . Attention deficit hyperactivity disorder (ADHD), combined type 07/14/2017  . Anxiety 11/17/2016  . Vitamin D deficiency 04/11/2016  . Seizure disorder ([place]) 02/29/2016  . Erectile dysfunction 02/29/2016  . Vitamin B12 deficiency 09/15/2015  . Sinus bradycardia 09/15/2015  . Attention deficit hyperactivity disorder (ADHD), predominantly inattentive type 12/19/2014  . Major depressive disorder, recurrent episode, moderate ([place]) 12/19/2014  . Hyperlipidemia LDL goal <130 11/18/2014  . Inattention 11/17/2014  . No energy 11/17/2014  . Bilateral plantar fasciitis 11/14/2014  . Bilateral foot pain 08/16/2012  . 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 Anxiety disorder 10/04/2010  . Hypothyroidism 08/01/2007  . GERD 08/01/2007  . SEIZURE DISORDER 08/01/2007  . Primary insomnia 08/01/2007    Debbe Odea, PT, DPT 08/23/2017, 12:44 PM  [facility] [place], [place], [address] Phone: [phone]   Fax:  [phone]  Name: Wesley Mckay MRN: [id number] Date of Birth: 02-Jul-1980

## 2017-08-28 ENCOUNTER — Encounter: Payer: Self-pay | Admitting: Rehabilitative and Restorative Service Providers"

## 2017-08-28 ENCOUNTER — Ambulatory Visit (INDEPENDENT_AMBULATORY_CARE_PROVIDER_SITE_OTHER): Payer: Managed Care, Other (non HMO) | Admitting: Rehabilitative and Restorative Service Providers"

## 2017-08-28 DIAGNOSIS — G8929 Other chronic pain: Secondary | ICD-10-CM

## 2017-08-28 DIAGNOSIS — M62838 Other muscle spasm: Secondary | ICD-10-CM | POA: Diagnosis not present

## 2017-08-28 DIAGNOSIS — M542 Cervicalgia: Secondary | ICD-10-CM

## 2017-08-28 NOTE — Therapy (Signed)
[place], [place], [address] Phone: [phone]   Fax:  [phone]  Physical Therapy Treatment  Patient Details  Name: Wesley Mckay MRN: [id number] Date of Birth: 03/16/1980 Referring Provider: Iran Planas PA-C   Encounter Date: 08/28/2017  PT End of Session - 08/28/17 1433    Visit Number  2    Number of Visits  12    Date for PT Re-Evaluation  10/04/17    PT Start Time  0277    PT Stop Time  1530    PT Time Calculation (min)  57 min    Activity Tolerance  Patient tolerated treatment well       Past Medical History:  Diagnosis Date  . GERD (gastroesophageal reflux disease)   . History of dislocation of shoulder   . Insomnia   . Obesity   . Seizures ([place]) 03/2007  . Thyroid disease    hypothyroidism    Past Surgical History:  Procedure Laterality Date  . SHOULDER SURGERY     Dr. Angelene Giovanni    There were no vitals filed for this visit.  Subjective Assessment - 08/28/17 1438    Subjective  Patient report some increase in neck - moving and doing a lot of lifting.    Currently in Pain?  Yes    Pain Score  3     Pain Location  Neck    Pain Orientation  Right;Left    Pain Descriptors / Indicators  Aching;Tightness;Constant    Pain Type  Chronic pain    Pain Onset  More than a month ago    Pain Frequency  Constant         [facility] PT Assessment - 08/28/17 0001      Assessment   Medical Diagnosis  chronic cervical pain with radiculopahty    Referring Provider  Iran Planas PA-C    Onset Date/Surgical Date  -- Chronic for 10 years, worse over last 3 months      AROM   Cervical Extension  34    Cervical - Right Side Bend  30    Cervical - Left Side Bend  23    Cervical - Right Rotation  45    Cervical - Left Rotation  47      Palpation   Palpation comment  musculartightness cervical and thoracic paraspinals; upper traps; leveator; pecs bilat                     [facility] Adult PT Treatment/Exercise - 08/28/17 0001      Shoulder Exercises: Stretch   Other Shoulder Stretches  3 way doorway stretch 30 sec x 1      Moist Heat Therapy   Number Minutes Moist Heat  15 Minutes    Moist Heat Location  Cervical;Shoulder      Electrical Stimulation   Electrical Stimulation Location  bilat cervical    Electrical Stimulation Action  TENS     Electrical Stimulation Parameters  to tolerance    Electrical Stimulation Goals  Pain;Tone      Manual Therapy   Manual therapy comments  pt prone    Soft tissue mobilization  deep tissue work through the cervical and thoracic spine musculature    Myofascial Release  cervical to thoracic spine       Neck Exercises: Stretches   Upper Trapezius Stretch  Right;Left;2 reps;10 seconds       Trigger Point Dry Needling - 08/28/17  1504    Consent Given?  Yes    Education Handout Provided  Yes    Muscles Treated Upper Body  Scalenes;Upper trapezius;Oblique capitus;Suboccipitals muscle group    Scalenes Response  Palpable increased muscle length    Upper Trapezius Response  Palpable increased muscle length    Oblique Capitus Response  Palpable increased muscle length    SubOccipitals Response  Palpable increased muscle length           PT Education - 08/28/17 1443    Education Details  HEP DN    Person(s) Educated  Patient    Methods  Explanation;Demonstration;Tactile cues;Verbal cues;Handout    Comprehension  Verbalized understanding;Returned demonstration;Verbal cues required;Tactile cues required          PT Long Term Goals - 08/28/17 1434      PT LONG TERM GOAL #1   Title  Pt will be independent and compliant with HEP. (6 weeks 10/04/17)    Status  On-going      PT LONG TERM GOAL #2   Title  Pt will improve cervical ROM to [facility]. (6 weeks 10/04/17)    Status  On-going      PT LONG TERM GOAL #3   Title  Pt will report overall less than 3/10 pain with all activites. (6 weeks  10/04/17)    Status  On-going      PT LONG TERM GOAL #4   Title  Pt will reduce soft tissue restrictions in upper traps and paraspinals from severe to no more than mild to decrease pain and increase funciton. (6 weeks 10/04/17)            Plan - 08/28/17 1443    Clinical Impression Statement  Continued pan with some increase in intemsity related to moving. He has been lifting and moving boxes and furniture, etc... Patient tolerated DN and manua lwork well with good improvement in tissue extensibility following treatment.     Rehab Potential  Good    PT Frequency  2x / week    PT Duration  6 weeks    PT Treatment/Interventions  ADLs/Self Care Home Management;Electrical Stimulation;Cryotherapy;Moist Heat;Iontophoresis 4mg /ml Dexamethasone;Traction;Ultrasound;Neuromuscular re-education;Manual techniques;Passive range of motion;Dry needling    PT Next Visit Plan  review HEP, modalites PRN, neck stretching, assess repsonse to DN     Consulted and Agree with Plan of Care  Patient       Patient will benefit from skilled therapeutic intervention in order to improve the following deficits and impairments:  Decreased activity tolerance, Decreased range of motion, Hypomobility, Increased fascial restricitons, Increased muscle spasms, Postural dysfunction, Pain  Visit Diagnosis: Chronic neck pain  Other muscle spasm     Problem List Patient Active Problem List   Diagnosis Date Noted  . Muscle spasm 07/14/2017  . Neck pain 07/14/2017  . Attention deficit hyperactivity disorder (ADHD), combined type 07/14/2017  . Anxiety 11/17/2016  . Vitamin D deficiency 04/11/2016  . Seizure disorder ([place]) 02/29/2016  . Erectile dysfunction 02/29/2016  . Vitamin B12 deficiency 09/15/2015  . Sinus bradycardia 09/15/2015  . Attention deficit hyperactivity disorder (ADHD), predominantly inattentive type 12/19/2014  . Major depressive disorder, recurrent episode, moderate ([place]) 12/19/2014  .  Hyperlipidemia LDL goal <130 11/18/2014  . Inattention 11/17/2014  . No energy 11/17/2014  . Bilateral plantar fasciitis 11/14/2014  . Bilateral foot pain 08/16/2012  . 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 Anxiety disorder 10/04/2010  . Hypothyroidism 08/01/2007  . GERD 08/01/2007  . SEIZURE DISORDER 08/01/2007  . Primary  insomnia 08/01/2007    Celyn Nilda Simmer PT, MPH  08/28/2017, 3:15 PM  [facility] [place], [place], [address] Phone: [phone]   Fax:  [phone]  Name: Wesley Mckay MRN: [id number] Date of Birth: 27-Nov-1980

## 2017-08-28 NOTE — Patient Instructions (Addendum)
Scapula Adduction With Pectoralis Stretch: Low - Standing   Shoulders at 45 hands even with shoulders, keeping weight through legs, shift weight forward until you feel pull or stretch through the front of your chest. Hold _30__ seconds. Do _3__ times, _2-4__ times per day.   Scapula Adduction With Pectoralis Stretch: Mid-Range - Standing   Shoulders at 90 elbows even with shoulders, keeping weight through legs, shift weight forward until you feel pull or strength through the front of your chest. Hold __30_ seconds. Do _3__ times, __2-4_ times per day.   Scapula Adduction With Pectoralis Stretch: High - Standing   Shoulders at 120 hands up high on the doorway, keeping weight on feet, shift weight forward until you feel pull or stretch through the front of your chest. Hold _30__ seconds. Do _3__ times, _2-3__ times per day.  Trigger Point Dry Needling  . What is Trigger Point Dry Needling (DN)? o DN is a physical therapy technique used to treat muscle pain and dysfunction. Specifically, DN helps deactivate muscle trigger points (muscle knots).  o A thin filiform needle is used to penetrate the skin and stimulate the underlying trigger point. The goal is for a local twitch response (LTR) to occur and for the trigger point to relax. No medication of any kind is injected during the procedure.   . What Does Trigger Point Dry Needling Feel Like?  o The procedure feels different for each individual patient. Some patients report that they do not actually feel the needle enter the skin and overall the process is not painful. Very mild bleeding may occur. However, many patients feel a deep cramping in the muscle in which the needle was inserted. This is the local twitch response.   [name] How Will I feel after the treatment? o Soreness is normal, and the onset of soreness may not occur for a few hours. Typically this soreness does not last longer than two days.  o Bruising is uncommon, however; ice  can be used to decrease any possible bruising.  o In rare cases feeling tired or nauseous after the treatment is normal. In addition, your symptoms may get worse before they get better, this period will typically not last longer than 24 hours.   . What Can I do After My Treatment? o Increase your hydration by drinking more water for the next 24 hours. o You may place ice or heat on the areas treated that have become sore, however, do not use heat on inflamed or bruised areas. Heat often brings more relief post needling. o You can continue your regular activities, but vigorous activity is not recommended initially after the treatment for 24 hours. o DN is best combined with other physical therapy such as strengthening, stretching, and other therapies.

## 2017-09-01 ENCOUNTER — Ambulatory Visit: Payer: Managed Care, Other (non HMO) | Admitting: Physical Therapy

## 2017-09-01 ENCOUNTER — Encounter: Payer: Self-pay | Admitting: Physical Therapy

## 2017-09-01 DIAGNOSIS — M62838 Other muscle spasm: Secondary | ICD-10-CM

## 2017-09-01 DIAGNOSIS — G8929 Other chronic pain: Secondary | ICD-10-CM

## 2017-09-01 DIAGNOSIS — M542 Cervicalgia: Secondary | ICD-10-CM | POA: Diagnosis not present

## 2017-09-01 NOTE — Therapy (Addendum)
[place], [place], [address] Phone: [phone]   Fax:  [phone]  Physical Therapy Treatment/Discharge  Patient Details  Name: Wesley Mckay MRN: [id number] Date of Birth: 1980/07/09 Referring Provider: Iran Planas PA-C   Encounter Date: 09/01/2017  PT End of Session - 09/01/17 1536    Visit Number  3    Number of Visits  12    Date for PT Re-Evaluation  10/04/17    PT Start Time  1410 pt arrived late    PT Stop Time  1505    PT Time Calculation (min)  55 min    Activity Tolerance  Patient tolerated treatment well    Behavior During Therapy  [facility] for tasks assessed/performed       Past Medical History:  Diagnosis Date  . GERD (gastroesophageal reflux disease)   . History of dislocation of shoulder   . Insomnia   . Obesity   . Seizures ([place]) 03/2007  . Thyroid disease    hypothyroidism    Past Surgical History:  Procedure Laterality Date  . SHOULDER SURGERY     Dr. Angelene Giovanni    There were no vitals filed for this visit.  Subjective Assessment - 09/01/17 1520    Subjective  Pt relays his neck is feeling better, he has less pain but still having some stiffness, he relays the DN helped last session, he relays his back is hurting more from moving    Pertinent History  relays seizure 10 years ago that dislocated his shoulders and fractured his Lt shoulder.    Limitations  Lifting    Diagnostic tests  X rays show min foraminal narrowing Rt C3-5 and Lt C3-4 otherwise negative    Currently in Pain?  Yes    Pain Score  2     Pain Location  Neck                       [facility] Adult PT Treatment/Exercise - 09/01/17 1532      Neck Exercises: Seated   Neck Retraction  20 reps;5 secs      Shoulder Exercises: Stretch   Other Shoulder Stretches  3 way doorway stretch 30 sec x 2      Modalities   Modalities  Electrical Stimulation;Moist Heat      Moist Heat Therapy   Number Minutes Moist Heat  15 Minutes    Moist Heat Location  Cervical;Shoulder      Electrical Stimulation   Electrical Stimulation Location  bilat cervical    Electrical Stimulation Action  TENS    Electrical Stimulation Parameters  tolerance    Electrical Stimulation Goals  Pain;Tone      Manual Therapy   Manual therapy comments  seated    Soft tissue mobilization  deep tissue work and IASTM through the cervical and thoracic spine musculature    Myofascial Release  cervical to thoracic spine       Neck Exercises: Stretches   Upper Trapezius Stretch  Right;Left;2 reps;30 seconds    Levator Stretch  Right;Left;2 reps;30 seconds                  PT Long Term Goals - 08/28/17 1434      PT LONG TERM GOAL #1   Title  Pt will be independent and compliant with HEP. (6 weeks 10/04/17)    Status  On-going      PT LONG TERM GOAL #2  Title  Pt will improve cervical ROM to [facility]. (6 weeks 10/04/17)    Status  On-going      PT LONG TERM GOAL #3   Title  Pt will report overall less than 3/10 pain with all activites. (6 weeks 10/04/17)    Status  On-going      PT LONG TERM GOAL #4   Title  Pt will reduce soft tissue restrictions in upper traps and paraspinals from severe to no more than mild to decrease pain and increase funciton. (6 weeks 10/04/17)            Plan - 09/01/17 1552    Clinical Impression Statement  Session focused on cervical-thoracic stretching and mobility. He was trialed with IASTM today to decrease soft tissue restrcitions and pain followed by heat and TENS to further reduce pain and stiffness. Overall neck pain seems to be decreasing but his back has been hurting more due to heavy lifting with him moving. Pt did express improved morale and seemed more postive this sesison    Rehab Potential  Good    PT Frequency  2x / week    PT Duration  6 weeks    PT Treatment/Interventions  ADLs/Self Care Home Management;Electrical Stimulation;Cryotherapy;Moist  Heat;Iontophoresis 75m/ml Dexamethasone;Traction;Ultrasound;Neuromuscular re-education;Manual techniques;Passive range of motion;Dry needling    PT Next Visit Plan  neck/thoracic stretching, mobility, and DN PRN    Consulted and Agree with Plan of Care  Patient       Patient will benefit from skilled therapeutic intervention in order to improve the following deficits and impairments:  Decreased activity tolerance, Decreased range of motion, Hypomobility, Increased fascial restricitons, Increased muscle spasms, Postural dysfunction, Pain  Visit Diagnosis: Chronic neck pain  Other muscle spasm     Problem List Patient Active Problem List   Diagnosis Date Noted  . Muscle spasm 07/14/2017  . Neck pain 07/14/2017  . Attention deficit hyperactivity disorder (ADHD), combined type 07/14/2017  . Anxiety 11/17/2016  . Vitamin D deficiency 04/11/2016  . Seizure disorder ([place] 02/29/2016  . Erectile dysfunction 02/29/2016  . Vitamin B12 deficiency 09/15/2015  . Sinus bradycardia 09/15/2015  . Attention deficit hyperactivity disorder (ADHD), predominantly inattentive type 12/19/2014  . Major depressive disorder, recurrent episode, moderate ([place] 12/19/2014  . Hyperlipidemia LDL goal <130 11/18/2014  . Inattention 11/17/2014  . No energy 11/17/2014  . Bilateral plantar fasciitis 11/14/2014  . Bilateral foot pain 08/16/2012  . 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 Anxiety disorder 10/04/2010  . Hypothyroidism 08/01/2007  . GERD 08/01/2007  . SEIZURE DISORDER 08/01/2007  . Primary insomnia 08/01/2007    BDebbe Odea PT, DPT 09/01/2017, 3:58 PM  [facility] [place] [address] [address]Phone: [phone]  Fax:  [phone] Name: Wesley RAINVILLEMRN: [id number] of Birth: [birth year]    PHYSICAL THERAPY DISCHARGE SUMMARY  Visits from Start of Care: 3  Current functional level related to goals /  functional outcomes: See above   Remaining deficits: See above; pt did not return   Education / Equipment: HEP  Plan: Patient agrees to discharge.  Patient goals were not met. Patient is being discharged due to not returning since the last visit.  ?????    SLaureen Abrahams PT, DPT 10/30/17 12:34 PM  [facility] [place] [address] [phone](office) [phone](fax)

## 2017-09-06 ENCOUNTER — Encounter: Payer: Self-pay | Admitting: Rehabilitative and Restorative Service Providers"

## 2017-09-08 ENCOUNTER — Encounter: Payer: Self-pay | Admitting: Rehabilitative and Restorative Service Providers"

## 2017-09-11 ENCOUNTER — Other Ambulatory Visit: Payer: Self-pay | Admitting: Physician Assistant

## 2017-09-13 ENCOUNTER — Other Ambulatory Visit: Payer: Self-pay

## 2017-09-13 DIAGNOSIS — E291 Testicular hypofunction: Secondary | ICD-10-CM

## 2017-09-13 MED ORDER — TESTOSTERONE CYPIONATE 200 MG/ML IM SOLN: 300.0000 mg | INTRAMUSCULAR | 5 refills | Status: DC

## 2017-10-09 ENCOUNTER — Other Ambulatory Visit: Payer: Self-pay | Admitting: Physician Assistant

## 2017-10-20 ENCOUNTER — Other Ambulatory Visit: Payer: Self-pay | Admitting: Physician Assistant

## 2017-10-20 DIAGNOSIS — F902 Attention-deficit hyperactivity disorder, combined type: Secondary | ICD-10-CM

## 2017-10-27 ENCOUNTER — Ambulatory Visit (INDEPENDENT_AMBULATORY_CARE_PROVIDER_SITE_OTHER): Payer: Managed Care, Other (non HMO) | Admitting: Physician Assistant

## 2017-10-27 ENCOUNTER — Encounter: Payer: Self-pay | Admitting: Physician Assistant

## 2017-10-27 DIAGNOSIS — F902 Attention-deficit hyperactivity disorder, combined type: Secondary | ICD-10-CM | POA: Diagnosis not present

## 2017-10-27 MED ORDER — LISDEXAMFETAMINE DIMESYLATE 60 MG PO CAPS: 60.0000 mg | ORAL_CAPSULE | ORAL | 0 refills | Status: DC

## 2017-10-27 MED ORDER — LISDEXAMFETAMINE DIMESYLATE 60 MG PO CAPS: ORAL_CAPSULE | ORAL | 0 refills | Status: DC

## 2017-10-27 NOTE — Progress Notes (Signed)
136/752 

## 2017-10-27 NOTE — Progress Notes (Signed)
   Subjective:    Patient ID: Wesley Mckay, male    DOB: March 26, 1980, 37 y.o.   MRN: [id number]  HPI Pt is a 37 yo male who presents to the clinic for ADHD follow up. Pt is on vyvanse and doing well. He is still looking for work. He works a lot around the home and helps with the kids currently. No increased anxiety. He has always had problems sleeping and continues to have that. No CP, palpitations, headaches, or vision changes.   .. Active Ambulatory Problems    Diagnosis Date Noted  . Hypothyroidism 08/01/2007  . GERD 08/01/2007  . SEIZURE DISORDER 08/01/2007  . Primary insomnia 08/01/2007  . Anxiety disorder 10/04/2010  . 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 Bilateral foot pain 08/16/2012  . Bilateral plantar fasciitis 11/14/2014  . Inattention 11/17/2014  . No energy 11/17/2014  . Hyperlipidemia LDL goal <130 11/18/2014  . Attention deficit hyperactivity disorder (ADHD), predominantly inattentive type 12/19/2014  . Major depressive disorder, recurrent episode, moderate ([place]) 12/19/2014  . Vitamin B12 deficiency 09/15/2015  . Sinus bradycardia 09/15/2015  . Seizure disorder ([place]) 02/29/2016  . Erectile dysfunction 02/29/2016  . Vitamin D deficiency 04/11/2016  . Anxiety 11/17/2016  . Muscle spasm 07/14/2017  . Neck pain 07/14/2017  . Attention deficit hyperactivity disorder (ADHD), combined type 07/14/2017   Resolved Ambulatory Problems    Diagnosis Date Noted  . FOOT PAIN, RIGHT 11/29/2007   Past Medical History:  Diagnosis Date  . GERD (gastroesophageal reflux disease)   . History of dislocation of shoulder   . Insomnia   . Obesity   . Seizures ([place]) 03/2007  . Thyroid disease       Review of Systems  All other systems reviewed and are negative.      Objective:   Physical Exam  Constitutional: He is oriented to person, place, and time. He appears well-developed and well-nourished.  HENT:  Head: Normocephalic and atraumatic.  Cardiovascular: Normal rate  and regular rhythm.  Pulmonary/Chest: Effort normal and breath sounds normal.  Neurological: He is alert and oriented to person, place, and time.  Psychiatric: He has a normal mood and affect. His behavior is normal.          Assessment & Plan:  Marland KitchenMarland KitchenDiagnoses and all orders for this visit:  Attention deficit hyperactivity disorder (ADHD), combined type -     lisdexamfetamine (VYVANSE) 60 MG capsule; Take 1 capsule (60 mg total) by mouth every morning. -     lisdexamfetamine (VYVANSE) 60 MG capsule; Take 1 capsule (60 mg total) by mouth every morning. -     lisdexamfetamine (VYVANSE) 60 MG capsule; Take 1 capsule (60 mg total) by mouth every morning.   3 month refill given. Follow up in 3 months.

## 2017-11-23 ENCOUNTER — Other Ambulatory Visit: Payer: Self-pay | Admitting: Physician Assistant

## 2017-11-23 DIAGNOSIS — F902 Attention-deficit hyperactivity disorder, combined type: Secondary | ICD-10-CM

## 2017-11-24 NOTE — Telephone Encounter (Signed)
He should have an rx to be filled on 10/13 and I have already sent it.

## 2017-11-27 ENCOUNTER — Other Ambulatory Visit: Payer: Self-pay

## 2017-11-27 DIAGNOSIS — N529 Male erectile dysfunction, unspecified: Secondary | ICD-10-CM

## 2017-11-27 MED ORDER — SILDENAFIL CITRATE 20 MG PO TABS: ORAL_TABLET | ORAL | 5 refills | Status: DC

## 2017-11-27 NOTE — Telephone Encounter (Signed)
Wesley Mckay, I got a prescription refill notice for [name] for there Revatio. I saw that it has not been refilled since last October (2018), so I just wanted to ask you first to be sure. Thanks!

## 2018-01-08 ENCOUNTER — Encounter: Payer: Self-pay | Admitting: Physician Assistant

## 2018-01-08 ENCOUNTER — Ambulatory Visit (INDEPENDENT_AMBULATORY_CARE_PROVIDER_SITE_OTHER): Payer: Managed Care, Other (non HMO) | Admitting: Physician Assistant

## 2018-01-08 VITALS — BP 136/77 | HR 98 | Temp 98.3°F | Wt 268.9 lb

## 2018-01-08 DIAGNOSIS — F902 Attention-deficit hyperactivity disorder, combined type: Secondary | ICD-10-CM

## 2018-01-08 DIAGNOSIS — N529 Male erectile dysfunction, unspecified: Secondary | ICD-10-CM

## 2018-01-08 DIAGNOSIS — M62838 Other muscle spasm: Secondary | ICD-10-CM

## 2018-01-08 DIAGNOSIS — M79671 Pain in right foot: Secondary | ICD-10-CM

## 2018-01-08 DIAGNOSIS — E291 Testicular hypofunction: Secondary | ICD-10-CM

## 2018-01-08 DIAGNOSIS — Z13220 Encounter for screening for lipoid disorders: Secondary | ICD-10-CM

## 2018-01-08 DIAGNOSIS — F5101 Primary insomnia: Secondary | ICD-10-CM

## 2018-01-08 DIAGNOSIS — E039 Hypothyroidism, unspecified: Secondary | ICD-10-CM

## 2018-01-08 DIAGNOSIS — M542 Cervicalgia: Secondary | ICD-10-CM | POA: Diagnosis not present

## 2018-01-08 DIAGNOSIS — Z131 Encounter for screening for diabetes mellitus: Secondary | ICD-10-CM

## 2018-01-08 DIAGNOSIS — M79672 Pain in left foot: Secondary | ICD-10-CM

## 2018-01-08 DIAGNOSIS — M722 Plantar fascial fibromatosis: Secondary | ICD-10-CM

## 2018-01-08 DIAGNOSIS — E538 Deficiency of other specified B group vitamins: Secondary | ICD-10-CM

## 2018-01-08 MED ORDER — SILDENAFIL CITRATE 20 MG PO TABS: ORAL_TABLET | ORAL | 5 refills | Status: DC

## 2018-01-08 MED ORDER — ZOLPIDEM TARTRATE 10 MG PO TABS: 10.0000 mg | ORAL_TABLET | Freq: Every evening | ORAL | 5 refills | Status: DC | PRN

## 2018-01-08 MED ORDER — CYANOCOBALAMIN 1000 MCG/ML IJ SOLN: 1000.0000 ug | INTRAMUSCULAR | 0 refills | Status: DC

## 2018-01-08 MED ORDER — LEVOTHYROXINE SODIUM 200 MCG PO TABS: ORAL_TABLET | ORAL | 0 refills | Status: DC

## 2018-01-08 MED ORDER — LISDEXAMFETAMINE DIMESYLATE 60 MG PO CAPS: 60.0000 mg | ORAL_CAPSULE | ORAL | 0 refills | Status: DC

## 2018-01-08 MED ORDER — CYCLOBENZAPRINE HCL 10 MG PO TABS: 10.0000 mg | ORAL_TABLET | Freq: Three times a day (TID) | ORAL | 1 refills | Status: DC | PRN

## 2018-01-08 MED ORDER — TESTOSTERONE CYPIONATE 200 MG/ML IM SOLN: 300.0000 mg | INTRAMUSCULAR | 5 refills | Status: DC

## 2018-01-08 NOTE — Progress Notes (Addendum)
Subjective:    Patient ID: Wesley Mckay, male    DOB: [birth year], 37 y.o.   MRN: [id number]  HPI Pt is a 37 yo male who presents to the clinic for 3 month follow up.   He is taking medication as directed.   He does complain of bilateral foot pain continuing. He saw Dr. Georgina Snell and got injections that lasted a few days. The last time he got injections they lasted a year. He got orthotics but did not seem to help and they broke in his shoe. Worse in the am but really with any walking.   .. Active Ambulatory Problems    Diagnosis Date Noted  . Hypothyroidism 08/01/2007  . GERD 08/01/2007  . SEIZURE DISORDER 08/01/2007  . Primary insomnia 08/01/2007  . Anxiety disorder 10/04/2010  . 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 Bilateral foot pain 08/16/2012  . Bilateral plantar fasciitis 11/14/2014  . Inattention 11/17/2014  . No energy 11/17/2014  . Hyperlipidemia LDL goal <130 11/18/2014  . Attention deficit hyperactivity disorder (ADHD), predominantly inattentive type 12/19/2014  . Major depressive disorder, recurrent episode, moderate ([place]) 12/19/2014  . B12 deficiency 09/15/2015  . Sinus bradycardia 09/15/2015  . Seizure disorder ([place]) 02/29/2016  . Erectile dysfunction 02/29/2016  . Vitamin D deficiency 04/11/2016  . Anxiety 11/17/2016  . Muscle spasm 07/14/2017  . Neck pain 07/14/2017  . Attention deficit hyperactivity disorder (ADHD), combined type 07/14/2017   Resolved Ambulatory Problems    Diagnosis Date Noted  . FOOT PAIN, RIGHT 11/29/2007   Past Medical History:  Diagnosis Date  . GERD (gastroesophageal reflux disease)   . History of dislocation of shoulder   . Insomnia   . Obesity   . Seizures ([place]) 03/2007  . Thyroid disease    Pt is doing great. No problems or concerns. He needs refills on everything. He has not had labs in a while.    Review of Systems See HPI.     Objective:   Physical Exam Constitutional:      Appearance: He is well-developed and  well-nourished.  HENT:     Head: Normocephalic and atraumatic.  Neck:     Thyroid: No thyromegaly.  Cardiovascular:     Rate and Rhythm: Normal rate and regular rhythm.  Pulmonary:     Effort: Pulmonary effort is normal.     Breath sounds: Normal breath sounds.  Musculoskeletal:     Comments: Bilateral heel pain to palpation.   Neurological:     Mental Status: He is alert and oriented to person, place, and time.  Psychiatric:        Mood and Affect: Mood and affect normal.        Behavior: Behavior normal.           Assessment & Plan:  Marland KitchenMarland KitchenDiagnoses and all orders for this visit:  Attention deficit hyperactivity disorder (ADHD), combined type -     Discontinue: lisdexamfetamine (VYVANSE) 60 MG capsule; Take 1 capsule (60 mg total) by mouth every morning. -     Discontinue: lisdexamfetamine (VYVANSE) 60 MG capsule; Take 1 capsule (60 mg total) by mouth every morning. -     Discontinue: lisdexamfetamine (VYVANSE) 60 MG capsule; Take 1 capsule (60 mg total) by mouth every morning.  Neck pain -     Discontinue: cyclobenzaprine (FLEXERIL) 10 MG tablet; Take 1 tablet (10 mg total) by mouth 3 (three) times daily as needed for muscle spasms.  Muscle spasm -     Discontinue: cyclobenzaprine (FLEXERIL) 10  MG tablet; Take 1 tablet (10 mg total) by mouth 3 (three) times daily as needed for muscle spasms.  Hypothyroidism, unspecified type -     Discontinue: levothyroxine (SYNTHROID, LEVOTHROID) 200 MCG tablet; Take one tablet (200 mcg total) by mouth daily. -     TSH -     levothyroxine (SYNTHROID) 50 MCG tablet; Take 1 tablet (50 mcg total) by mouth daily.  Male hypogonadism -     Discontinue: testosterone cypionate (DEPOTESTOSTERONE CYPIONATE) 200 MG/ML injection; Inject 1.5 mLs (300 mg total) into the muscle every 14 (fourteen) days. -     Testosterone -     PSA -     COMPLETE METABOLIC PANEL WITH GFR -     CBC with Differential/Platelet  Erectile dysfunction, unspecified  erectile dysfunction type -     sildenafil (REVATIO) 20 MG tablet; TAKE 1-5 TABLETS BY MOUTH AS NEEDED  Screening for diabetes mellitus -     COMPLETE METABOLIC PANEL WITH GFR  Screening for lipid disorders -     Lipid Panel w/reflex Direct LDL  B12 deficiency -     cyanocobalamin (,VITAMIN B-12,) 1000 MCG/ML injection; Inject 1 mL (1,000 mcg total) into the muscle every 30 (thirty) days. -     B12 and Folate Panel  Primary insomnia -     Discontinue: zolpidem (AMBIEN) 10 MG tablet; Take 1 tablet (10 mg total) by mouth at bedtime as needed for sleep.  Bilateral foot pain -     Ambulatory referral to Podiatry  Bilateral plantar fasciitis -     Ambulatory referral to Podiatry   Refilled adderall for 3 months.   Labs ordered. Refilled medication. Will adjust accordingly.   He has ongoing issues with plantar fasciitis. Seen Dr. Georgina Snell for injections and only helped for a few days. Referred to podiatry. Discussed cam boot to take pressure off feet. Pt declined. Wear good supportive shoes. Ice feet regularly. Continues stretches.

## 2018-01-10 ENCOUNTER — Telehealth: Payer: Self-pay | Admitting: Physician Assistant

## 2018-01-10 ENCOUNTER — Encounter: Payer: Self-pay | Admitting: Physician Assistant

## 2018-01-10 NOTE — Telephone Encounter (Signed)
Received fax from [employer] that Sildenafil requires a PA. Information has been sent to the insurance company. Awaiting determination.

## 2018-01-15 NOTE — Telephone Encounter (Signed)
Received fax that Sildenafil did not meet medical necessity. No option to appeal.

## 2018-02-08 ENCOUNTER — Encounter: Payer: Self-pay | Admitting: Family Medicine

## 2018-02-08 ENCOUNTER — Ambulatory Visit (INDEPENDENT_AMBULATORY_CARE_PROVIDER_SITE_OTHER): Payer: Managed Care, Other (non HMO) | Admitting: Family Medicine

## 2018-02-08 VITALS — BP 131/83 | HR 84 | Temp 98.1°F | Wt 265.0 lb

## 2018-02-08 DIAGNOSIS — M79672 Pain in left foot: Secondary | ICD-10-CM

## 2018-02-08 DIAGNOSIS — M722 Plantar fascial fibromatosis: Secondary | ICD-10-CM | POA: Diagnosis not present

## 2018-02-08 DIAGNOSIS — M79671 Pain in right foot: Secondary | ICD-10-CM | POA: Diagnosis not present

## 2018-02-08 NOTE — Progress Notes (Signed)
Wesley Mckay is a 37 y.o. male who presents to [facility] today for left foot pain.  Wesley Mckay has a history of plantar fasciitis and foot pronation and medial ankle subluxation. About 2 years ago he had plantar fascia injections and orthotics. He notes that his pain started returning about 3 months ago worsening about 6 weeks ago.  He notes the orthotics have worn out and he thinks he needs another pair as well.  His plantar fasciitis symptoms are moderate and interfere with normal walking and normal job duties.  ROS as above:  Exam:  BP 131/83   Pulse 84   Temp 98.1 F (36.7 C) (Oral)   Wt 265 lb (120.2 kg)   BMI 33.12 kg/m  Wt Readings from Last 5 Encounters:  02/08/18 265 lb (120.2 kg)  01/08/18 268 lb 14.4 oz (122 kg)  10/27/17 265 lb (120.2 kg)  07/12/17 258 lb (117 kg)  03/03/17 258 lb (117 kg)    Gen: Well NAD HEENT: EOMI,  MMM Lungs: Normal work of breathing. CTABL Heart: RRR no MRG Abd: NABS, Soft. Nondistended, Nontender Exts: Brisk capillary refill, warm and well perfused.  Feet bilaterally have significant pes planus left foot with significant ankle subluxation.  Patient does have some heel inversion with toe standing but less in the left compared to the right. Tender palpation medial plantar calcaneus. Normal gait.  Lab and Radiology Results Procedure: Real-time Ultrasound Guided Injection of left foot plantar fascia Device: GE Logiq E   Images permanently stored and available for review in the ultrasound unit. Verbal informed consent obtained.  Discussed risks and benefits of procedure. Warned about infection bleeding damage to structures skin hypopigmentation and fat atrophy among others. Patient expresses understanding and agreement Time-out conducted.   Noted no overlying erythema, induration, or other signs of local infection.     Skin prepped in a sterile fashion.   Local anesthesia: Topical Ethyl chloride.   With sterile technique and under real time ultrasound guidance:  40 mg of Depo-Medrol and 2 mL of Marcaine injected easily.   Completed without difficulty   Pain immediately resolved suggesting accurate placement of the medication.   Advised to call if fevers/chills, erythema, induration, drainage, or persistent bleeding.   Images permanently stored and available for review in the ultrasound unit.  Impression: Technically successful ultrasound guided injection.        Assessment and Plan: 37 y.o. male with left foot plantar fasciitis in the setting of significant pes planus and ankle subluxation.  Plan for injection today.  Continue home exercise program.  Patient will return in the near future for orthotics. Return sooner if needed.   No orders of the defined types were placed in this encounter.  No orders of the defined types were placed in this encounter.    Historical information moved to improve visibility of documentation.  Past Medical History:  Diagnosis Date  . GERD (gastroesophageal reflux disease)   . History of dislocation of shoulder   . Insomnia   . Obesity   . Seizures ([place]) 03/2007  . Thyroid disease    hypothyroidism   Past Surgical History:  Procedure Laterality Date  . SHOULDER SURGERY     Dr. Angelene Giovanni   Social History   Tobacco Use  . Smoking status: Current Every Day Smoker    Packs/day: 1.00    Years: 15.00    Pack years: 15.00    Types:  Cigarettes  . Smokeless tobacco: Never Used  Substance Use Topics  . Alcohol use: Yes    Alcohol/week: 2.0 standard drinks    Types: 2 Standard drinks or equivalent per week   family history includes Diabetes in his father; Heart attack in his father; Thyroid disease in his maternal grandmother.  Medications: Current Outpatient Medications  Medication Sig Dispense Refill  . AMBULATORY NON FORMULARY MEDICATION 3 ml syringe  use every 2 weeks for testosterone injection  #100  22 1 1/2 gauge needle use every 2 weeks for testosterone injection  #100 100 each 0  . clonazePAM (KLONOPIN) 0.5 MG tablet Take 1 tablet (0.5 mg total) by mouth at bedtime as needed. 30 tablet 5  . cyanocobalamin (,VITAMIN B-12,) 1000 MCG/ML injection Inject 1 mL (1,000 mcg total) into the muscle every 30 (thirty) days. 10 mL 0  . cyclobenzaprine (FLEXERIL) 10 MG tablet Take 1 tablet (10 mg total) by mouth 3 (three) times daily as needed for muscle spasms. 30 tablet 1  . dexlansoprazole (DEXILANT) 60 MG capsule Take 1 capsule (60 mg total) by mouth daily. 90 capsule 4  . levothyroxine (SYNTHROID, LEVOTHROID) 200 MCG tablet Take one tablet (200 mcg total) by mouth daily. 90 tablet 0  . levothyroxine (SYNTHROID, LEVOTHROID) 50 MCG tablet Take one tablet (50 mcg total) by mouth once daily before breakfast. 90 tablet 0  . lisdexamfetamine (VYVANSE) 60 MG capsule Take 1 capsule (60 mg total) by mouth every morning. 30 capsule 0  . [START ON 03/10/2018] lisdexamfetamine (VYVANSE) 60 MG capsule Take 1 capsule (60 mg total) by mouth every morning. 30 capsule 0  . lisdexamfetamine (VYVANSE) 60 MG capsule Take 1 capsule (60 mg total) by mouth every morning. 30 capsule 0  . lisdexamfetamine (VYVANSE) 60 MG capsule Take 1 capsule (60 mg total) by mouth every morning. 30 capsule 0  . Misc Natural Products (HORNY GOAT WEED PO) Take by mouth.    [name] omeprazole (PRILOSEC) 40 MG capsule Take 1 capsule (40 mg total) by mouth 2 (two) times daily. 180 capsule 3  . sildenafil (REVATIO) 20 MG tablet TAKE 1-5 TABLETS BY MOUTH AS NEEDED 30 tablet 5  . testosterone cypionate (DEPOTESTOSTERONE CYPIONATE) 200 MG/ML injection Inject 1.5 mLs (300 mg total) into the muscle every 14 (fourteen) days. 3 mL 5  . zolpidem (AMBIEN) 10 MG tablet Take 1 tablet (10 mg total) by mouth at bedtime as needed for sleep. 30 tablet 5   No current facility-administered medications for this visit.     Allergies  Allergen Reactions  . Sulfa Antibiotics     Itching and rash     Discussed warning signs or symptoms. Please see discharge instructions. Patient expresses understanding.

## 2018-02-08 NOTE — Patient Instructions (Signed)
Thank you for coming in today. Call or go to the ER if you develop a large red swollen joint with extreme pain or oozing puss.  Continue the home exercises.  Ice in the evening helps.  Recheck as needed.

## 2018-03-26 ENCOUNTER — Emergency Department
Admission: EM | Admit: 2018-03-26 | Discharge: 2018-03-26 | Disposition: A | Discharge status: Home / Self Care | Payer: Managed Care, Other (non HMO) | Source: Home / Self Care | Attending: Emergency Medicine | Admitting: Emergency Medicine

## 2018-03-26 ENCOUNTER — Encounter: Payer: Self-pay | Admitting: *Deleted

## 2018-03-26 DIAGNOSIS — S161XXA Strain of muscle, fascia and tendon at neck level, initial encounter: Secondary | ICD-10-CM

## 2018-03-26 NOTE — Discharge Instructions (Signed)
Verbal instructions given. Patient declined AVS today

## 2018-03-26 NOTE — ED Triage Notes (Signed)
Neck and back pain after raking leaves 2-3 days ago.

## 2018-03-26 NOTE — Telephone Encounter (Signed)
Received a fax that testosterone has been approved from 03/20/2018 through 03/21/2019. Pharmacy aware and form sent to scan.

## 2018-03-27 NOTE — ED Provider Notes (Signed)
[facility]    CSN: [id number] Arrival date & time: 03/26/18  0825     History   Chief Complaint Chief Complaint  Patient presents with  . Back Pain  . Neck Injury    HPI Wesley Mckay is a 38 y.o. male.   HPI  Complains of moderately severe posterior neck and upper back pain and soreness and tightness for 3 days, occurred after intensely raking thick wet leaves for many hours.  No radiation or paresthesias or focal weakness.  No fever or chills or nausea or vomiting.  He has a longstanding history of neck pain and muscle spasm in the past, and used some Flexeril he had at home and that helped the muscle spasm.  Past Medical History:  Diagnosis Date  . GERD (gastroesophageal reflux disease)   . History of dislocation of shoulder   . Insomnia   . Obesity   . Seizures ([place]) 03/2007  . Thyroid disease    hypothyroidism    Patient Active Problem List   Diagnosis Date Noted  . Muscle spasm 07/14/2017  . Neck pain 07/14/2017  . Attention deficit hyperactivity disorder (ADHD), combined type 07/14/2017  . Anxiety 11/17/2016  . Vitamin D deficiency 04/11/2016  . Seizure disorder ([place]) 02/29/2016  . Erectile dysfunction 02/29/2016  . B12 deficiency 09/15/2015  . Sinus bradycardia 09/15/2015  . Attention deficit hyperactivity disorder (ADHD), predominantly inattentive type 12/19/2014  . Major depressive disorder, recurrent episode, moderate ([place]) 12/19/2014  . Hyperlipidemia LDL goal <130 11/18/2014  . Inattention 11/17/2014  . No energy 11/17/2014  . Bilateral plantar fasciitis 11/14/2014  . Bilateral foot pain 08/16/2012  . 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 Anxiety disorder 10/04/2010  . Hypothyroidism 08/01/2007  . GERD 08/01/2007  . SEIZURE DISORDER 08/01/2007  . Primary insomnia 08/01/2007    Past Surgical History:  Procedure Laterality Date  . SHOULDER SURGERY     Dr. Angelene Giovanni       Home Medications    Prior to Admission medications     Medication Sig Start Date End Date Taking? Authorizing Provider  clonazePAM (KLONOPIN) 0.5 MG tablet Take 1 tablet (0.5 mg total) by mouth at bedtime as needed. 07/12/17  Yes Breeback, Jade L, PA-C  cyclobenzaprine (FLEXERIL) 10 MG tablet Take 1 tablet (10 mg total) by mouth 3 (three) times daily as needed for muscle spasms. 01/08/18  Yes Breeback, Jade L, PA-C  levothyroxine (SYNTHROID, LEVOTHROID) 200 MCG tablet Take one tablet (200 mcg total) by mouth daily. 01/08/18  Yes Breeback, Jade L, PA-C  levothyroxine (SYNTHROID, LEVOTHROID) 50 MCG tablet Take one tablet (50 mcg total) by mouth once daily before breakfast. 07/12/17  Yes Breeback, Jade L, PA-C  omeprazole (PRILOSEC) 40 MG capsule Take 1 capsule (40 mg total) by mouth 2 (two) times daily. 07/20/17  Yes Breeback, Jade L, PA-C  AMBULATORY NON FORMULARY MEDICATION 3 ml syringe use every 2 weeks for testosterone injection  #100  22 1 1/2 gauge needle use every 2 weeks for testosterone injection  #100 07/22/16   Breeback, Jade L, PA-C  cyanocobalamin (,VITAMIN B-12,) 1000 MCG/ML injection Inject 1 mL (1,000 mcg total) into the muscle every 30 (thirty) days. 01/08/18   Breeback, Royetta Car, PA-C  dexlansoprazole (DEXILANT) 60 MG capsule Take 1 capsule (60 mg total) by mouth daily. 07/12/17   Breeback, Jade L, PA-C  lisdexamfetamine (VYVANSE) 60 MG capsule Take 1 capsule (60 mg total) by mouth every morning. 10/27/17   Breeback, Luvenia Starch L, PA-C  lisdexamfetamine (  VYVANSE) 60 MG capsule Take 1 capsule (60 mg total) by mouth every morning. 03/10/18   Breeback, Royetta Car, PA-C  lisdexamfetamine (VYVANSE) 60 MG capsule Take 1 capsule (60 mg total) by mouth every morning. 02/07/18   Breeback, Royetta Car, PA-C  lisdexamfetamine (VYVANSE) 60 MG capsule Take 1 capsule (60 mg total) by mouth every morning. 01/08/18   Breeback, Royetta Car, PA-C  Misc Natural Products (HORNY GOAT WEED PO) Take by mouth.    [provider]  sildenafil (REVATIO) 20 MG tablet TAKE 1-5 TABLETS BY  MOUTH AS NEEDED 01/08/18   Breeback, Jade L, PA-C  testosterone cypionate (DEPOTESTOSTERONE CYPIONATE) 200 MG/ML injection Inject 1.5 mLs (300 mg total) into the muscle every 14 (fourteen) days. 01/08/18   Breeback, Jade L, PA-C  zolpidem (AMBIEN) 10 MG tablet Take 1 tablet (10 mg total) by mouth at bedtime as needed for sleep. 01/08/18 02/07/18  Donella Stade, PA-C    Family History Family History  Problem Relation Age of Onset  . Thyroid disease Maternal Grandmother   . Diabetes Father   . Heart attack Father   . Healthy Mother     Social History Social History   Tobacco Use  . Smoking status: Current Every Day Smoker    Packs/day: 1.00    Years: 15.00    Pack years: 15.00    Types: Cigarettes  . Smokeless tobacco: Never Used  Substance Use Topics  . Alcohol use: Yes    Alcohol/week: 2.0 standard drinks    Types: 2 Standard drinks or equivalent per week  . Drug use: No     Allergies   Sulfa antibiotics   Review of Systems Review of Systems  All other systems reviewed and are negative.  Pertinent items noted in HPI and remainder of comprehensive ROS otherwise negative.   Physical Exam Triage Vital Signs ED Triage Vitals  Enc Vitals Group     BP 03/26/18 0852 128/81     Pulse Rate 03/26/18 0852 (!) 58     Resp 03/26/18 0852 16     Temp 03/26/18 0852 97.9 F (36.6 C)     Temp Source 03/26/18 0852 Oral     SpO2 --      Weight 03/26/18 0853 240 lb (108.9 kg)     Height 03/26/18 0853 6\' 3"  (1.905 m)     Head Circumference --      Peak Flow --      Pain Score 03/26/18 0853 4     Pain Loc --      Pain Edu? --      Excl. in [place]? --    No data found.  Updated Vital Signs BP 128/81 (BP Location: Right Arm)   Pulse (!) 58   Temp 97.9 F (36.6 C) (Oral)   Resp 16   Ht 6\' 3"  (1.905 m)   Wt 108.9 kg   BMI 30.00 kg/m   Visual Acuity Right Eye Distance:   Left Eye Distance:   Bilateral Distance:    Right Eye Near:   Left Eye Near:    Bilateral  Near:     Physical Exam  Alert, pleasant male, no distress. Head: Normocephalic atraumatic Eyes: EOMI, [place] Oropharynx: Clear Neck: Tenderness with spasm posterior cervical muscles right and left paracervical muscles.  No midline or spinal tenderness or deformity.  Range of motion slightly decreased but no meningeal signs.  Flexion extension and lateral bending of neck exacerbates his pain.  Without radiation. Shoulders: Some muscle  tenderness bilaterally but no bony tenderness or deformity. Neuro: Motor, sensory intact bilaterally    UC Treatments / Results  Labs (all labs ordered are listed, but only abnormal results are displayed) Labs Reviewed - No data to display  EKG None  Radiology No results found.  Procedures Procedures (including critical care time)  Medications Ordered in UC Medications - No data to display  Initial Impression / Assessment and Plan / UC Course  I have reviewed the triage vital signs and the nursing notes.  Pertinent labs & imaging results that were available during my care of the patient were reviewed by me and considered in my medical decision making (see chart for details).     Acute muscular neck strain.  No evidence of acute neurologic event or discogenic cause for his pain. Final Clinical Impressions(s) / UC Diagnoses   Final diagnoses:  Neck strain, initial encounter  Options discussed with patient.  He declined any imaging today. He prefers symptomatic care with rest, alternate ice and heat, and other OTC care such as Tylenol or ibuprofen.  He already has some Flexeril at home to use sparingly as needed and precautions discussed.  His work requires him to do vigorous work and climbing, and I wrote a note excusing him from work today.    Discharge Instructions     Verbal instructions given. Patient declined AVS today   Follow-up with your primary care doctor in 5 days if not improving, or sooner if symptoms become  worse. Precautions discussed. Red flags discussed. Questions invited and answered. Patient voiced understanding and agreement.    Jacqulyn Cane, MD 03/27/18 1428

## 2018-04-27 ENCOUNTER — Ambulatory Visit (INDEPENDENT_AMBULATORY_CARE_PROVIDER_SITE_OTHER): Payer: Managed Care, Other (non HMO) | Admitting: Physician Assistant

## 2018-04-27 ENCOUNTER — Encounter: Payer: Self-pay | Admitting: Physician Assistant

## 2018-04-27 ENCOUNTER — Other Ambulatory Visit: Payer: Self-pay

## 2018-04-27 VITALS — BP 131/63 | HR 70 | Ht 75.0 in | Wt 254.0 lb

## 2018-04-27 DIAGNOSIS — F902 Attention-deficit hyperactivity disorder, combined type: Secondary | ICD-10-CM

## 2018-04-27 DIAGNOSIS — E291 Testicular hypofunction: Secondary | ICD-10-CM | POA: Diagnosis not present

## 2018-04-27 MED ORDER — LISDEXAMFETAMINE DIMESYLATE 60 MG PO CAPS: 60.0000 mg | ORAL_CAPSULE | ORAL | 0 refills | Status: DC

## 2018-04-27 MED ORDER — TESTOSTERONE CYPIONATE 200 MG/ML IM SOLN: 300.0000 mg | INTRAMUSCULAR | 5 refills | Status: DC

## 2018-04-27 NOTE — Progress Notes (Signed)
   Subjective:    Patient ID: Wesley Mckay, male    DOB: 17-Feb-1980, 38 y.o.   MRN: [id number]  HPI Pt is a 38 yo male with ADHD and hypogonadism who presents to the clinic for medication refill.   Pt is doing well. No problems or concerns. Doing well with vyvanse and testosterone. Feels good. No increase in anxiety, palpitations, CP, headaches.   .. Active Ambulatory Problems    Diagnosis Date Noted  . Hypothyroidism 08/01/2007  . GERD 08/01/2007  . SEIZURE DISORDER 08/01/2007  . Primary insomnia 08/01/2007  . Anxiety disorder 10/04/2010  . 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 Bilateral foot pain 08/16/2012  . Bilateral plantar fasciitis 11/14/2014  . Inattention 11/17/2014  . No energy 11/17/2014  . Hyperlipidemia LDL goal <130 11/18/2014  . Attention deficit hyperactivity disorder (ADHD), predominantly inattentive type 12/19/2014  . Major depressive disorder, recurrent episode, moderate ([place]) 12/19/2014  . B12 deficiency 09/15/2015  . Sinus bradycardia 09/15/2015  . Seizure disorder ([place]) 02/29/2016  . Erectile dysfunction 02/29/2016  . Vitamin D deficiency 04/11/2016  . Anxiety 11/17/2016  . Muscle spasm 07/14/2017  . Neck pain 07/14/2017  . Attention deficit hyperactivity disorder (ADHD), combined type 07/14/2017   Resolved Ambulatory Problems    Diagnosis Date Noted  . FOOT PAIN, RIGHT 11/29/2007   Past Medical History:  Diagnosis Date  . GERD (gastroesophageal reflux disease)   . History of dislocation of shoulder   . Insomnia   . Obesity   . Seizures ([place]) 03/2007  . Thyroid disease        Review of Systems   see HPI.  Objective:   Physical Exam Vitals signs reviewed.  Constitutional:      Appearance: Normal appearance.  HENT:     Head: Normocephalic and atraumatic.  Cardiovascular:     Rate and Rhythm: Normal rate and regular rhythm.     Pulses: Normal pulses.  Pulmonary:     Effort: Pulmonary effort is normal.     Breath sounds: Normal  breath sounds.  Neurological:     General: No focal deficit present.     Mental Status: He is alert and oriented to person, place, and time.  Psychiatric:        Mood and Affect: Mood normal.        Behavior: Behavior normal.           Assessment & Plan:  Marland KitchenMarland KitchenDiagnoses and all orders for this visit:  Attention deficit hyperactivity disorder (ADHD), combined type -     lisdexamfetamine (VYVANSE) 60 MG capsule; Take 1 capsule (60 mg total) by mouth every morning. -     lisdexamfetamine (VYVANSE) 60 MG capsule; Take 1 capsule (60 mg total) by mouth every morning. -     lisdexamfetamine (VYVANSE) 60 MG capsule; Take 1 capsule (60 mg total) by mouth every morning.  Male hypogonadism -     testosterone cypionate (DEPOTESTOSTERONE CYPIONATE) 200 MG/ML injection; Inject 1.5 mLs (300 mg total) into the muscle every 14 (fourteen) days.  refilled for 3 months. Pt needs labs. Reprinted today.

## 2018-05-10 ENCOUNTER — Other Ambulatory Visit: Payer: Self-pay | Admitting: Physician Assistant

## 2018-05-10 DIAGNOSIS — E039 Hypothyroidism, unspecified: Secondary | ICD-10-CM

## 2018-05-14 ENCOUNTER — Telehealth: Payer: Self-pay

## 2018-05-14 NOTE — Telephone Encounter (Signed)
Received call from Case from [facility] in [place] requesting clarification on pt's Levothyroxine dose. Unsure if patient was supposed to be taking a 200 mcg tablet AND a 50 mcg tablet?   Discussed with provider and she believes that patient is to be taking BOTH 200 and 50 mcg tablets. Waiting to call patient and confirm, but he MUST get labs done.  Placed call to patient, no answer and VM full. Will try again/

## 2018-05-15 NOTE — Telephone Encounter (Signed)
Attempted to call again, no answer and not able to leave msg

## 2018-05-17 NOTE — Telephone Encounter (Signed)
No answer, mailbox full

## 2018-06-01 ENCOUNTER — Encounter: Payer: Self-pay | Admitting: Physician Assistant

## 2018-06-04 ENCOUNTER — Other Ambulatory Visit: Payer: Self-pay | Admitting: Neurology

## 2018-06-04 MED ORDER — LEVOTHYROXINE SODIUM 125 MCG PO TABS: 250.0000 ug | ORAL_TABLET | Freq: Every day | ORAL | 1 refills | Status: DC

## 2018-06-14 ENCOUNTER — Other Ambulatory Visit: Payer: Self-pay | Admitting: Osteopathic Medicine

## 2018-06-14 DIAGNOSIS — E039 Hypothyroidism, unspecified: Secondary | ICD-10-CM

## 2018-06-14 NOTE — Telephone Encounter (Signed)
Routing to Dr. Redgie Grayer refill pool

## 2018-07-11 ENCOUNTER — Ambulatory Visit: Payer: Managed Care, Other (non HMO) | Admitting: Physician Assistant

## 2018-07-20 ENCOUNTER — Ambulatory Visit: Payer: Self-pay | Admitting: Physician Assistant

## 2018-07-30 ENCOUNTER — Encounter: Payer: Self-pay | Admitting: Physician Assistant

## 2018-07-30 ENCOUNTER — Ambulatory Visit (INDEPENDENT_AMBULATORY_CARE_PROVIDER_SITE_OTHER): Payer: Managed Care, Other (non HMO) | Admitting: Physician Assistant

## 2018-07-30 VITALS — BP 142/88 | HR 73 | Temp 98.1°F | Ht 75.0 in | Wt 266.0 lb

## 2018-07-30 DIAGNOSIS — M722 Plantar fascial fibromatosis: Secondary | ICD-10-CM

## 2018-07-30 DIAGNOSIS — M62838 Other muscle spasm: Secondary | ICD-10-CM | POA: Diagnosis not present

## 2018-07-30 DIAGNOSIS — F902 Attention-deficit hyperactivity disorder, combined type: Secondary | ICD-10-CM

## 2018-07-30 DIAGNOSIS — M542 Cervicalgia: Secondary | ICD-10-CM | POA: Diagnosis not present

## 2018-07-30 DIAGNOSIS — E039 Hypothyroidism, unspecified: Secondary | ICD-10-CM | POA: Diagnosis not present

## 2018-07-30 DIAGNOSIS — F5101 Primary insomnia: Secondary | ICD-10-CM

## 2018-07-30 MED ORDER — LISDEXAMFETAMINE DIMESYLATE 60 MG PO CAPS: 60.0000 mg | ORAL_CAPSULE | ORAL | 0 refills | Status: DC

## 2018-07-30 MED ORDER — LEVOTHYROXINE SODIUM 125 MCG PO TABS: 250.0000 ug | ORAL_TABLET | Freq: Every day | ORAL | 1 refills | Status: DC

## 2018-07-30 MED ORDER — ZOLPIDEM TARTRATE 10 MG PO TABS: 10.0000 mg | ORAL_TABLET | Freq: Every evening | ORAL | 5 refills | Status: DC | PRN

## 2018-07-30 MED ORDER — CYCLOBENZAPRINE HCL 10 MG PO TABS: 10.0000 mg | ORAL_TABLET | Freq: Three times a day (TID) | ORAL | 1 refills | Status: DC | PRN

## 2018-07-30 MED ORDER — DICLOFENAC SODIUM 75 MG PO TBEC: 75.0000 mg | DELAYED_RELEASE_TABLET | Freq: Three times a day (TID) | ORAL | 2 refills | Status: DC

## 2018-07-30 NOTE — Progress Notes (Signed)
Subjective:    Patient ID: Wesley Mckay, male    DOB: [birth year], 38 y.o.   MRN: [id number]  HPI Pt is a 38 yo male with hypogonadism, hypothyroidism, ADHD, insomnia who presents to the clinic for follow up.   Overall is reports to be doing "ok". No increase in anxiety or depression. Focus is ok. Taking medication daily. No CP, palpitations, headaches.   He does continue to have bilateral heel/foot pain. He got an injection in heel by Dr. Georgina Snell but only lasted a few weeks compared to previously almost a year. His orthotics did not seem to help either and reports to have broken. He admits he does not like wearing good supportive shoes.   .. Active Ambulatory Problems    Diagnosis Date Noted  . Hypothyroidism 08/01/2007  . GERD 08/01/2007  . SEIZURE DISORDER 08/01/2007  . Primary insomnia 08/01/2007  . Anxiety disorder 10/04/2010  . 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 Bilateral foot pain 08/16/2012  . Bilateral plantar fasciitis 11/14/2014  . Inattention 11/17/2014  . No energy 11/17/2014  . Hyperlipidemia LDL goal <130 11/18/2014  . Attention deficit hyperactivity disorder (ADHD), predominantly inattentive type 12/19/2014  . Major depressive disorder, recurrent episode, moderate ([place]) 12/19/2014  . B12 deficiency 09/15/2015  . Sinus bradycardia 09/15/2015  . Seizure disorder ([place]) 02/29/2016  . Erectile dysfunction 02/29/2016  . Vitamin D deficiency 04/11/2016  . Anxiety 11/17/2016  . Muscle spasm 07/14/2017  . Neck pain 07/14/2017  . Attention deficit hyperactivity disorder (ADHD), combined type 07/14/2017   Resolved Ambulatory Problems    Diagnosis Date Noted  . FOOT PAIN, RIGHT 11/29/2007   Past Medical History:  Diagnosis Date  . GERD (gastroesophageal reflux disease)   . History of dislocation of shoulder   . Insomnia   . Obesity   . Seizures ([place]) 03/2007  . Thyroid disease       Review of Systems  All other systems reviewed and are negative.       Objective:   Physical Exam Vitals signs reviewed.  Constitutional:      Appearance: Normal appearance. He is obese.  HENT:     Head: Normocephalic.  Cardiovascular:     Rate and Rhythm: Normal rate and regular rhythm.  Pulmonary:     Effort: Pulmonary effort is normal.     Breath sounds: Normal breath sounds.  Musculoskeletal:     Comments: Tenderness to palpation over both heels.   Neurological:     General: No focal deficit present.     Mental Status: He is alert and oriented to person, place, and time.  Psychiatric:        Mood and Affect: Mood normal.           Assessment & Plan:  Marland KitchenMarland KitchenGranvel was seen today for adhd.  Diagnoses and all orders for this visit:  Acquired hypothyroidism -     levothyroxine (SYNTHROID) 125 MCG tablet; Take 2 tablets (250 mcg total) by mouth daily before breakfast.  Attention deficit hyperactivity disorder (ADHD), combined type -     lisdexamfetamine (VYVANSE) 60 MG capsule; Take 1 capsule (60 mg total) by mouth every morning. -     lisdexamfetamine (VYVANSE) 60 MG capsule; Take 1 capsule (60 mg total) by mouth every morning. -     lisdexamfetamine (VYVANSE) 60 MG capsule; Take 1 capsule (60 mg total) by mouth every morning.  Neck pain -     cyclobenzaprine (FLEXERIL) 10 MG tablet; Take 1 tablet (10 mg total) by  mouth 3 (three) times daily as needed for muscle spasms.  Muscle spasm -     cyclobenzaprine (FLEXERIL) 10 MG tablet; Take 1 tablet (10 mg total) by mouth 3 (three) times daily as needed for muscle spasms.  Primary insomnia -     zolpidem (AMBIEN) 10 MG tablet; Take 1 tablet (10 mg total) by mouth at bedtime as needed for up to 30 days for sleep.  Bilateral plantar fasciitis -     diclofenac (VOLTAREN) 75 MG EC tablet; Take 1 tablet (75 mg total) by mouth 3 (three) times daily.  refills given for vyvanse.   Labs done today. Will adjust medications accordingly.  Referral made for podiatry. Given diclofenac orally to try.   Continue with icing and good supportive shows.  Discussed cam boot. Pt declined today.   Follow up in 3 months.

## 2018-07-31 LAB — COMPLETE METABOLIC PANEL WITH GFR
AG Ratio: 1.9 (calc) (ref 1.0–2.5)
ALBUMIN MSPROF: 4.5 g/dL (ref 3.6–5.1)
ALT: 13 U/L (ref 9–46)
AST: 15 U/L (ref 10–40)
Alkaline phosphatase (APISO): 91 U/L (ref 36–130)
BUN: 13 mg/dL (ref 7–25)
CALCIUM: 9.8 mg/dL (ref 8.6–10.3)
CO2: 26 mmol/L (ref 20–32)
CREATININE: 1.23 mg/dL (ref 0.60–1.35)
Chloride: 104 mmol/L (ref 98–110)
GFR, Est African American: 85 mL/min/{1.73_m2} (ref >=60)
GFR, Est Non African American: 73 mL/min/{1.73_m2} (ref >=60)
Globulin: 2.4 g/dL (calc) (ref 1.9–3.7)
Glucose, Bld: 102 mg/dL (ref 65–139)
Potassium: 4.5 mmol/L (ref 3.5–5.3)
SODIUM: 140 mmol/L (ref 135–146)
TOTAL PROTEIN: 6.9 g/dL (ref 6.1–8.1)
Total Bilirubin: 0.6 mg/dL (ref 0.2–1.2)

## 2018-07-31 LAB — CBC WITH DIFFERENTIAL/PLATELET
ABSOLUTE MONOCYTES: 374 {cells}/uL (ref 200–950)
BASOS ABS: 61 {cells}/uL (ref 0–200)
BASOS PCT: 0.9 %
EOS PCT: 3.4 %
Eosinophils Absolute: 231 cells/uL (ref 15–500)
HEMATOCRIT: 46.7 % (ref 38.5–50.0)
Hemoglobin: 16.2 g/dL (ref 13.2–17.1)
LYMPHS ABS: 1734 {cells}/uL (ref 850–3900)
MCH: 30.4 pg (ref 27.0–33.0)
MCHC: 34.7 g/dL (ref 32.0–36.0)
MCV: 87.6 fL (ref 80.0–100.0)
MPV: 9.9 fL (ref 7.5–12.5)
Monocytes Relative: 5.5 %
Neutro Abs: 4400 cells/uL (ref 1500–7800)
Neutrophils Relative %: 64.7 %
PLATELETS: 266 10*3/uL (ref 140–400)
RBC: 5.33 10*6/uL (ref 4.20–5.80)
RDW: 13 % (ref 11.0–15.0)
Total Lymphocyte: 25.5 %
WBC: 6.8 10*3/uL (ref 3.8–10.8)

## 2018-07-31 LAB — B12 AND FOLATE PANEL
FOLATE: 2.6 ng/mL — AB
Vitamin B-12: 1028 pg/mL (ref 200–1100)

## 2018-07-31 LAB — PSA: PSA: 0.6 ng/mL (ref <=4.0)

## 2018-07-31 LAB — TSH: TSH: 8.97 mIU/L — ABNORMAL HIGH (ref 0.40–4.50)

## 2018-07-31 LAB — TESTOSTERONE: Testosterone: 110 ng/dL — ABNORMAL LOW (ref 250–827)

## 2018-07-31 LAB — LIPID PANEL W/REFLEX DIRECT LDL
CHOL/HDL RATIO: 4.8 (calc) (ref <5.0)
CHOLESTEROL: 189 mg/dL (ref <200)
HDL: 39 mg/dL — AB (ref >=40)
LDL Cholesterol (Calc): 125 mg/dL (calc) — ABNORMAL HIGH
Non-HDL Cholesterol (Calc): 150 mg/dL (calc) — ABNORMAL HIGH (ref <130)
Triglycerides: 142 mg/dL (ref <150)

## 2018-08-01 MED ORDER — LEVOTHYROXINE SODIUM 50 MCG PO TABS: 50.0000 ug | ORAL_TABLET | Freq: Every day | ORAL | 1 refills | Status: DC

## 2018-08-01 NOTE — Addendum Note (Signed)
Addended by: Donella Stade on: 08/01/2018 04:07 PM   Modules accepted: Orders

## 2018-08-01 NOTE — Progress Notes (Signed)
B12 looks great. Folate is low. I would add folic acid supplement.  Testosterone is still really low. When was your last shot? Thyroid is still very hypothyroid. We need to increase synthroid confirm dose so to make sure we are increasing it. Make sure taking in the morning without food or other medications.  Kidney, liver look good.  Cholesterol not quite to goal. Work on low fat diet with exercise.

## 2018-08-02 ENCOUNTER — Other Ambulatory Visit: Payer: Self-pay | Admitting: *Deleted

## 2018-08-02 DIAGNOSIS — E039 Hypothyroidism, unspecified: Secondary | ICD-10-CM

## 2018-08-02 DIAGNOSIS — E291 Testicular hypofunction: Secondary | ICD-10-CM

## 2018-08-07 ENCOUNTER — Ambulatory Visit: Payer: Managed Care, Other (non HMO) | Admitting: Podiatry

## 2018-08-07 ENCOUNTER — Encounter: Payer: Self-pay | Admitting: Podiatry

## 2018-08-07 ENCOUNTER — Ambulatory Visit (INDEPENDENT_AMBULATORY_CARE_PROVIDER_SITE_OTHER): Payer: Managed Care, Other (non HMO)

## 2018-08-07 ENCOUNTER — Other Ambulatory Visit: Payer: Self-pay | Admitting: Podiatry

## 2018-08-07 ENCOUNTER — Other Ambulatory Visit: Payer: Self-pay

## 2018-08-07 VITALS — Temp 97.2°F

## 2018-08-07 DIAGNOSIS — M2142 Flat foot [pes planus] (acquired), left foot: Secondary | ICD-10-CM | POA: Diagnosis not present

## 2018-08-07 DIAGNOSIS — M722 Plantar fascial fibromatosis: Secondary | ICD-10-CM

## 2018-08-07 DIAGNOSIS — M2141 Flat foot [pes planus] (acquired), right foot: Secondary | ICD-10-CM

## 2018-08-07 DIAGNOSIS — M7752 Other enthesopathy of left foot: Secondary | ICD-10-CM | POA: Diagnosis not present

## 2018-08-07 DIAGNOSIS — M779 Enthesopathy, unspecified: Secondary | ICD-10-CM

## 2018-08-07 MED ORDER — MELOXICAM 15 MG PO TABS: 15.0000 mg | ORAL_TABLET | Freq: Every day | ORAL | 0 refills | Status: DC

## 2018-08-14 NOTE — Progress Notes (Signed)
Subjective:   Patient ID: Wesley Mckay, male   DOB: 38 y.o.   MRN: [id number]   HPI 38 year old male presents the office with concerns of pain to his left foot more than right.  He states he has flat feet is been going to the heels.  Patient continues to have injections a couple years ago which were helpful for short time but the pain came back.  He denies any recent injury or trauma.  Start significant swelling to the ankles at times.  He denies any numbness or tingling or any radiating pain.  He is not diabetic.  He states it hurts to walk at times.  He is on his feet quite a bit during the day which causes discomfort.   Review of Systems  All other systems reviewed and are negative.  Past Medical History:  Diagnosis Date  . GERD (gastroesophageal reflux disease)   . History of dislocation of shoulder   . Insomnia   . Obesity   . Seizures ([place]) 03/2007  . Thyroid disease    hypothyroidism    Past Surgical History:  Procedure Laterality Date  . SHOULDER SURGERY     Dr. Angelene Giovanni     Current Outpatient Medications:  .  AMBULATORY NON FORMULARY MEDICATION, 3 ml syringe use every 2 weeks for testosterone injection  #100  22 1 1/2 gauge needle use every 2 weeks for testosterone injection  #100, Disp: 100 each, Rfl: 0 .  clonazePAM (KLONOPIN) 0.5 MG tablet, Take 1 tablet (0.5 mg total) by mouth at bedtime as needed., Disp: 30 tablet, Rfl: 5 .  cyanocobalamin (,VITAMIN B-12,) 1000 MCG/ML injection, Inject 1 mL (1,000 mcg total) into the muscle every 30 (thirty) days., Disp: 10 mL, Rfl: 0 .  cyclobenzaprine (FLEXERIL) 10 MG tablet, Take 1 tablet (10 mg total) by mouth 3 (three) times daily as needed for muscle spasms., Disp: 30 tablet, Rfl: 1 .  diclofenac (VOLTAREN) 75 MG EC tablet, Take 1 tablet (75 mg total) by mouth 3 (three) times daily., Disp: 90 tablet, Rfl: 2 .  levothyroxine (SYNTHROID) 125 MCG tablet, Take 2 tablets (250 mcg total) by mouth daily before breakfast., Disp: 180  tablet, Rfl: 1 .  levothyroxine (SYNTHROID) 50 MCG tablet, Take 1 tablet (50 mcg total) by mouth daily., Disp: 30 tablet, Rfl: 1 .  [START ON 08/29/2018] lisdexamfetamine (VYVANSE) 60 MG capsule, Take 1 capsule (60 mg total) by mouth every morning., Disp: 30 capsule, Rfl: 0 .  meloxicam (MOBIC) 15 MG tablet, Take 1 tablet (15 mg total) by mouth daily., Disp: 30 tablet, Rfl: 0 .  omeprazole (PRILOSEC) 40 MG capsule, Take 1 capsule (40 mg total) by mouth 2 (two) times daily., Disp: 180 capsule, Rfl: 3 .  sildenafil (REVATIO) 20 MG tablet, TAKE 1-5 TABLETS BY MOUTH AS NEEDED, Disp: 30 tablet, Rfl: 5 .  testosterone cypionate (DEPOTESTOSTERONE CYPIONATE) 200 MG/ML injection, Inject 1.5 mLs (300 mg total) into the muscle every 14 (fourteen) days., Disp: 3 mL, Rfl: 5 .  zolpidem (AMBIEN) 10 MG tablet, Take 1 tablet (10 mg total) by mouth at bedtime as needed for up to 30 days for sleep., Disp: 30 tablet, Rfl: 5  Allergies  Allergen Reactions  . Sulfa Antibiotics     Itching and rash         Objective:  Physical Exam  General: AAO x3, NAD  Dermatological: Skin is warm, dry and supple bilateral. Nails x 10 are well manicured; remaining integument appears unremarkable at this  time. There are no open sores, no preulcerative lesions, no rash or signs of infection present.  Vascular: Dorsalis Pedis artery and Posterior Tibial artery pedal pulses are 2/4 bilateral with immedate capillary fill time. Pedal hair growth present. No varicosities and no lower extremity edema present bilateral. There is no pain with calf compression, swelling, warmth, erythema.   Neruologic: Grossly intact via light touch bilateral. Vibratory intact via tuning fork bilateral. Protective threshold with Semmes Wienstein monofilament intact to all pedal sites bilateral.  Negative Tinel sign.  Musculoskeletal: There is a decrease in medial arch upon weightbearing.  Ankle, subtalar, midtarsal range of motion intact.  There is  tenderness on the medial band plantar fashion the arch of the foot as well as insertion of the plantar fascia.  There is no area pinpoint tenderness.  Minimal edema to the ankle however there is mild discomfort in the course of the posterior tibial tendon.  Tendon appears to be intact.  Achilles tendon intact.  Muscular strength 5/5 in all groups tested bilateral.  Gait: Unassisted, Nonantalgic.       Assessment:   Left foot medial plantar fasciitis, ankle tendinitis of the results of flatfoot.     Plan:  -Treatment options discussed including all alternatives, risks, and complications -Etiology of symptoms were discussed -X-rays were obtained and reviewed with the patient.  Flatfoot is present.  There is no evidence of acute fracture. -We did discussion regards to multiple options.  Hold off on steroid injections as not been helpful previously. Prescribed mobic to use as needed. Discussed side effects of the medication and directed to stop if any are to occur and call the office.  -We discussed stretching, icing daily as well.  Also I do think he needs orthotics given his flatfoot.  He was seen today.  Recommended measured for new orthotics.  Unfortunately with his foot type needs to have better support with the amount he is on his feet.  Trula Slade DPM

## 2018-08-28 ENCOUNTER — Other Ambulatory Visit: Payer: Managed Care, Other (non HMO) | Admitting: Orthotics

## 2018-08-28 ENCOUNTER — Other Ambulatory Visit: Payer: Self-pay

## 2018-09-11 ENCOUNTER — Encounter: Payer: Self-pay | Admitting: Physician Assistant

## 2018-09-11 MED ORDER — LEVOTHYROXINE SODIUM 150 MCG PO TABS: 300.0000 ug | ORAL_TABLET | Freq: Every day | ORAL | 1 refills | Status: DC

## 2018-10-30 ENCOUNTER — Other Ambulatory Visit: Payer: Self-pay

## 2018-10-30 ENCOUNTER — Ambulatory Visit (INDEPENDENT_AMBULATORY_CARE_PROVIDER_SITE_OTHER): Payer: Managed Care, Other (non HMO) | Admitting: Physician Assistant

## 2018-10-30 VITALS — BP 154/84 | HR 90 | Ht 75.0 in | Wt 272.0 lb

## 2018-10-30 DIAGNOSIS — F902 Attention-deficit hyperactivity disorder, combined type: Secondary | ICD-10-CM

## 2018-10-30 DIAGNOSIS — R030 Elevated blood-pressure reading, without diagnosis of hypertension: Secondary | ICD-10-CM

## 2018-10-30 DIAGNOSIS — E291 Testicular hypofunction: Secondary | ICD-10-CM | POA: Diagnosis not present

## 2018-10-30 DIAGNOSIS — D229 Melanocytic nevi, unspecified: Secondary | ICD-10-CM

## 2018-10-30 DIAGNOSIS — E039 Hypothyroidism, unspecified: Secondary | ICD-10-CM | POA: Diagnosis not present

## 2018-10-30 DIAGNOSIS — M722 Plantar fascial fibromatosis: Secondary | ICD-10-CM

## 2018-10-30 DIAGNOSIS — M545 Low back pain, unspecified: Secondary | ICD-10-CM

## 2018-10-30 DIAGNOSIS — Z6834 Body mass index (BMI) 34.0-34.9, adult: Secondary | ICD-10-CM | POA: Insufficient documentation

## 2018-10-30 DIAGNOSIS — R0683 Snoring: Secondary | ICD-10-CM | POA: Diagnosis not present

## 2018-10-30 DIAGNOSIS — G478 Other sleep disorders: Secondary | ICD-10-CM | POA: Insufficient documentation

## 2018-10-30 DIAGNOSIS — E6609 Other obesity due to excess calories: Secondary | ICD-10-CM | POA: Insufficient documentation

## 2018-10-30 MED ORDER — DICLOFENAC SODIUM 75 MG PO TBEC: 75.0000 mg | DELAYED_RELEASE_TABLET | Freq: Two times a day (BID) | ORAL | 2 refills | Status: DC

## 2018-10-30 NOTE — Patient Instructions (Signed)
Suvorexant oral tablets What is this medicine? SUVOREXANT (su-vor-EX-ant) is used to treat insomnia. This medicine helps you to fall asleep and sleep through the night. This medicine may be used for other purposes; ask your health care provider or pharmacist if you have questions. COMMON BRAND NAME(S): Belsomra What should I tell my health care provider before I take this medicine? They need to know if you have any of these conditions:  depression  drink alcohol  drug abuse or addiction  feel sleepy or have fallen asleep suddenly during the day  history of a sudden onset of muscle weakness (cataplexy)  liver disease  lung or breathing disease, like asthma or emphysema  sleep apnea  suicidal thoughts, plans, or attempt; a previous suicide attempt by you or a family member  an unusual or allergic reaction to suvorexant, other medicines, foods, dyes, or preservatives  pregnant or trying to get pregnant  breast-feeding How should I use this medicine? Take this medicine by mouth within 30 minutes of going to bed. Do not take it unless you are able to stay in bed a full night before you must be active again. Follow the directions on the prescription label. You may take this medicine with or without a food. However, this medicine may take longer to work if you take it with or right after meals. Do not take your medicine more often than directed. Do not stop taking this medicine on your own. Always follow your doctor or health care professional's advice. A special MedGuide will be given to you by the pharmacist with each prescription and refill. Be sure to read this information carefully each time. Talk to your pediatrician regarding the use of this medicine in children. Special care may be needed. Overdosage: If you think you have taken too much of this medicine contact a poison control center or emergency room at once. NOTE: This medicine is only for you. Do not share this medicine with  others. What if I miss a dose? This medicine should only be taken immediately before going to sleep. Do not take double or extra doses. What may interact with this medicine?  alcohol  antihistamines for allergy, cough, or cold  aprepitant  boceprevir  certain antibiotics like ciprofloxacin, clarithromycin, erythromycin, telithromycin  certain antivirals for HIV or AIDS  certain medicines for anxiety or sleep  certain medicines for depression like amitriptyline, fluoxetine, nefazodone, sertraline  certain medicines for fungal infections like ketoconazole, posaconazole, fluconazole, itraconazole  certain medicines for seizures like carbamazepine, phenobarbital, primidone, phenytoin  conivaptan  digoxin  diltiazem  general anesthetics like halothane, isoflurane, methoxyflurane, propofol  grapefruit juice  imatinib  medicines that relax muscles for surgery  narcotic medicines for pain  phenothiazines like chlorpromazine, mesoridazine, prochlorperazine, thioridazine  rifampin  verapamil This list may not describe all possible interactions. Give your health care provider a list of all the medicines, herbs, non-prescription drugs, or dietary supplements you use. Also tell them if you smoke, drink alcohol, or use illegal drugs. Some items may interact with your medicine. What should I watch for while using this medicine? Visit your health care professional for regular checks on your progress. Tell your health care professional if your symptoms do not start to get better or if they get worse. Avoid caffeine-containing drinks in the evening hours. After taking this medicine, you may get up out of bed and do an activity that you do not know you are doing. The next morning, you may have no memory of this.  Activities include driving a car ("sleep-driving"), making and eating food, talking on the phone, sexual activity, and sleep-walking. Serious injuries have occurred. Call your  doctor right away if you find out you have done any of these activities. Do not take this medicine if you have used alcohol that evening. Do not take it if you have taken another medicine for sleep. °Do not take this medicine unless you are able to stay in bed for a full night (7 to 8 hours) and do not drive or perform other activities requiring full alertness within 8 hours of a dose. Do not drive, use machinery, or do anything that needs mental alertness the day after you take the 20 mg dose of this medicine. The use of lower doses (10 mg) may also cause driving impairment the next day. You may have a decrease in mental alertness the day after use, even if you feel that you are fully awake. Tell your doctor if you will need to perform activities requiring full alertness, such as driving, the next day. Do not stand or sit up quickly after taking this medicine, especially if you are an older patient. This reduces the risk of dizzy or fainting spells. °If you or your family notice any changes in your behavior, such as new or worsening depression, thoughts of harming yourself, anxiety, other unusual or disturbing thoughts, or memory loss, call your health care professional right away. °After you stop taking this medicine, you may have trouble falling asleep. This is called rebound insomnia. This problem usually goes away on its own after 1 or 2 nights. °What side effects may I notice from receiving this medicine? °Side effects that you should report to your doctor or health care professional as soon as possible: °· allergic reactions like skin rash, itching or hives, swelling of the face, lips, or tongue °· hallucinations °· periods of leg weakness lasting from seconds to a few minutes °· suicidal thoughts, mood changes °· unable to move or speak for several minutes while going to sleep or waking up °· unusual activities while not fully awake like driving, eating, making phone calls, or sexual activity °Side effects  that usually do not require medical attention (report these to your doctor or health care professional if they continue or are bothersome): °· daytime drowsiness °· headache °· nightmares or abnormal dreams °· tiredness °This list may not describe all possible side effects. Call your doctor for medical advice about side effects. You may report side effects to FDA at 1-800-FDA-1088. °Where should I keep my medicine? °Keep out of the reach of children. This medicine can be abused. Keep your medicine in a safe place to protect it from theft. Do not share this medicine with anyone. Selling or giving away this medicine is dangerous and against the law. °Store at room temperature between 15 and 30 degrees C (59 and 86 degrees F). Throw away any unused medicine after the expiration date. °NOTE: This sheet is a summary. It may not cover all possible information. If you have questions about this medicine, talk to your doctor, pharmacist, or health care provider. °© 2020 Elsevier/Gold Standard (2018-02-16 16:37:12) ° °

## 2018-10-30 NOTE — Progress Notes (Signed)
Subjective:    Patient ID: Wesley Mckay, male    DOB: [birth year], 37 y.o.   MRN: [id number]  HPI  Pt is a 38 yo obese male with ADHD, hypothyroidism, hypogonadism, anxiety, bilateral plantar fascitis who presents to the clinic for 3 month follow up.   ADHD- doing well. No problems or concerns. No increase in anxiety or insomnia. He continues to have problems sleeping. Does not wake up feeling rested. Snores. Wife tells him he has stopped breathing at one point. He has been referred for sleep study before but did not want to go to lab so never went.   Taking testosterone weekly at lower dose. No problems or concerns.   Pt is taking synthroid at bedtime 2(150mg ) tablets.   He has been having low back pain which is new. He is concerned because he sees podiatry for his bilateral plantar facitis and made him orthotics. Orthotics are helping his feet but concerned it is why his back has started to hurt. No numbness or tingling radiating down leg. No strength changes. No saddle anesthesia or bowel or bladder dysfunction. He cannot remember any injury. Not taking anything to make better.      .. Active Ambulatory Problems    Diagnosis Date Noted  . Hypothyroidism 08/01/2007  . GERD 08/01/2007  . SEIZURE DISORDER 08/01/2007  . Primary insomnia 08/01/2007  . Anxiety disorder 10/04/2010  . 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 Bilateral foot pain 08/16/2012  . Bilateral plantar fasciitis 11/14/2014  . Inattention 11/17/2014  . No energy 11/17/2014  . Hyperlipidemia LDL goal <130 11/18/2014  . Attention deficit hyperactivity disorder (ADHD), predominantly inattentive type 12/19/2014  . Major depressive disorder, recurrent episode, moderate ([place]) 12/19/2014  . B12 deficiency 09/15/2015  . Sinus bradycardia 09/15/2015  . Seizure disorder ([place]) 02/29/2016  . Erectile dysfunction 02/29/2016  . Vitamin D deficiency 04/11/2016  . Anxiety 11/17/2016  . Muscle spasm 07/14/2017  . Neck pain  07/14/2017  . Attention deficit hyperactivity disorder (ADHD), combined type 07/14/2017  . 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 Nonspecific findings on examination of blood 09/18/2012  . Sleeping difficulty 09/18/2012  . Non-restorative sleep 10/30/2018  . Snoring 10/30/2018  . Class 1 obesity due to excess calories without serious comorbidity with body mass index (BMI) of 34.0 to 34.9 in adult 10/30/2018  . 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 Elevated blood pressure reading 11/02/2018   Resolved Ambulatory Problems    Diagnosis Date Noted  . FOOT PAIN, RIGHT 11/29/2007   Past Medical History:  Diagnosis Date  . GERD (gastroesophageal reflux disease)   . History of dislocation of shoulder   . Insomnia   . Obesity   . Seizures ([place]) 03/2007  . Thyroid disease         Review of Systems  All other systems reviewed and are negative.      Objective:   Physical Exam Vitals signs reviewed.  Constitutional:      Appearance: Normal appearance. He is obese.  HENT:     Head: Normocephalic.     Ears:     Comments: Right anti helix circular 73mm hyperpigmented nevus with heterogenous hyperpigmentation.   Cardiovascular:     Rate and Rhythm: Normal rate and regular rhythm.     Pulses: Normal pulses.  Pulmonary:     Effort: Pulmonary effort is normal.     Breath sounds: Normal breath sounds.  Musculoskeletal: Normal range of motion.     Comments: No lumbar tenderness over spine.  NROM at waist.  Negative straight leg.  Lower ext strength 5/5.  DTR intact.   Neurological:     General: No focal deficit present.     Mental Status: He is alert and oriented to person, place, and time.  Psychiatric:        Mood and Affect: Mood normal.        Behavior: Behavior normal.           Assessment & Plan:  Marland KitchenMarland KitchenCassanova was seen today for adhd.  Diagnoses and all orders for this visit:  Attention deficit hyperactivity disorder (ADHD), combined type  Hypogonadism in male -      Testosterone  Hypothyroidism, unspecified type -     TSH -     T4, free -     T3, free -     levothyroxine (SYNTHROID) 150 MCG tablet; Take 2 tablets (300 mcg total) by mouth daily before breakfast.  Snoring -     Home sleep test  Non-restorative sleep -     Home sleep test  Class 1 obesity due to excess calories without serious comorbidity with body mass index (BMI) of 34.0 to 34.9 in adult -     Home sleep test  Suspicious nevus -     Ambulatory referral to Dermatology  Acute bilateral low back pain without sciatica -     diclofenac (VOLTAREN) 75 MG EC tablet; Take 1 tablet (75 mg total) by mouth 2 (two) times daily.  Bilateral plantar fasciitis -     diclofenac (VOLTAREN) 75 MG EC tablet; Take 1 tablet (75 mg total) by mouth 2 (two) times daily.  Elevated blood pressure reading  ADHD-refilled for 3 months  Need to check testosterone and TSH to make sure in normal range and get refills.   Low back pain-pt declined imaging today. Will start with NSAId, extension stretches, discussed massage. Discussed good lifting technique and posture. chiropractor also could help. Orthotics usually help posture so likely not making worse unless your alignment is really off. Follow up as needed.   Pt is high risk for sleep apnea. Referred to lab before but did not go. He is ok with home sleep study. Referral placed today. I think patient would be a great candidate for belsomnra. Gave HO.   BP elevated today. Wife is a Marine scientist. Keep monitoring and report back in 2 weeks with readings.   Pt has not noticed mole on outer right ear. Appears dark and heterogenous shades of hyperpigmentation. Will refer to dermatology for consult.

## 2018-10-31 ENCOUNTER — Other Ambulatory Visit: Payer: Self-pay | Admitting: Neurology

## 2018-10-31 DIAGNOSIS — E039 Hypothyroidism, unspecified: Secondary | ICD-10-CM

## 2018-10-31 DIAGNOSIS — F902 Attention-deficit hyperactivity disorder, combined type: Secondary | ICD-10-CM

## 2018-10-31 LAB — TESTOSTERONE: Testosterone: 707 ng/dL (ref 250–827)

## 2018-10-31 LAB — TSH: TSH: 0.58 mIU/L (ref 0.40–4.50)

## 2018-10-31 LAB — T3, FREE: T3, Free: 2.4 pg/mL (ref 2.3–4.2)

## 2018-10-31 LAB — T4, FREE: Free T4: 1.4 ng/dL (ref 0.8–1.8)

## 2018-10-31 MED ORDER — LISDEXAMFETAMINE DIMESYLATE 60 MG PO CAPS: 60.0000 mg | ORAL_CAPSULE | ORAL | 0 refills | Status: DC

## 2018-10-31 MED ORDER — LEVOTHYROXINE SODIUM 150 MCG PO TABS: 300.0000 ug | ORAL_TABLET | Freq: Every day | ORAL | 5 refills | Status: DC

## 2018-10-31 NOTE — Progress Notes (Signed)
Call pt:  Testosterone is great! TSH in normal range and t4/t3 in normal range as well. Recheck in 6 months to confirm staying in range.

## 2018-10-31 NOTE — Telephone Encounter (Signed)
Patient was seen yesterday. Needs Vyvanse refills. Please sign.

## 2018-11-02 ENCOUNTER — Encounter: Payer: Self-pay | Admitting: Physician Assistant

## 2018-11-02 DIAGNOSIS — R030 Elevated blood-pressure reading, without diagnosis of hypertension: Secondary | ICD-10-CM | POA: Insufficient documentation

## 2018-11-02 DIAGNOSIS — D229 Melanocytic nevi, unspecified: Secondary | ICD-10-CM | POA: Insufficient documentation

## 2018-11-05 ENCOUNTER — Other Ambulatory Visit: Payer: Self-pay | Admitting: Physician Assistant

## 2018-11-05 DIAGNOSIS — M542 Cervicalgia: Secondary | ICD-10-CM

## 2018-11-05 DIAGNOSIS — M62838 Other muscle spasm: Secondary | ICD-10-CM

## 2018-11-11 ENCOUNTER — Other Ambulatory Visit: Payer: Self-pay | Admitting: Physician Assistant

## 2018-11-11 DIAGNOSIS — E291 Testicular hypofunction: Secondary | ICD-10-CM

## 2018-11-12 NOTE — Telephone Encounter (Signed)
Last OV 10/30/18  Last RX sent 04/27/18 with 5 RF  RX pended

## 2018-11-19 ENCOUNTER — Encounter: Payer: Self-pay | Admitting: Physician Assistant

## 2018-11-20 NOTE — Telephone Encounter (Signed)
Order was faxed to [facility] on 9/23 I re faxed it again today (11/20/2018) I am not sure why they have not contacted patient yet unless they are still waiting on insurance verification. - CF

## 2019-01-02 ENCOUNTER — Other Ambulatory Visit: Payer: Self-pay | Admitting: Physician Assistant

## 2019-01-02 DIAGNOSIS — N529 Male erectile dysfunction, unspecified: Secondary | ICD-10-CM

## 2019-01-22 ENCOUNTER — Other Ambulatory Visit: Payer: Self-pay | Admitting: Neurology

## 2019-01-22 MED ORDER — OMEPRAZOLE 40 MG PO CPDR: 40.0000 mg | DELAYED_RELEASE_CAPSULE | Freq: Two times a day (BID) | ORAL | 3 refills | Status: DC

## 2019-01-23 ENCOUNTER — Ambulatory Visit (HOSPITAL_BASED_OUTPATIENT_CLINIC_OR_DEPARTMENT_OTHER): Payer: Managed Care, Other (non HMO) | Attending: Physician Assistant | Admitting: Internal Medicine

## 2019-01-23 ENCOUNTER — Other Ambulatory Visit: Payer: Self-pay

## 2019-01-23 DIAGNOSIS — G478 Other sleep disorders: Secondary | ICD-10-CM | POA: Diagnosis not present

## 2019-01-23 DIAGNOSIS — R0683 Snoring: Secondary | ICD-10-CM | POA: Insufficient documentation

## 2019-01-23 DIAGNOSIS — Z6834 Body mass index (BMI) 34.0-34.9, adult: Secondary | ICD-10-CM

## 2019-01-23 DIAGNOSIS — G4733 Obstructive sleep apnea (adult) (pediatric): Secondary | ICD-10-CM | POA: Insufficient documentation

## 2019-02-03 DIAGNOSIS — E6609 Other obesity due to excess calories: Secondary | ICD-10-CM

## 2019-02-03 DIAGNOSIS — G478 Other sleep disorders: Secondary | ICD-10-CM

## 2019-02-03 DIAGNOSIS — Z6834 Body mass index (BMI) 34.0-34.9, adult: Secondary | ICD-10-CM | POA: Diagnosis not present

## 2019-02-03 DIAGNOSIS — R0683 Snoring: Secondary | ICD-10-CM | POA: Diagnosis not present

## 2019-02-03 NOTE — Progress Notes (Signed)
Positive for sleep apnea. Needs cpap. Ok to send order to [facility] for machine autotitration.

## 2019-02-03 NOTE — Procedures (Signed)
    Patient Name: Wesley Mckay, Wesley Mckay Date: 01/23/2019 Gender: Male D.O.B: August 11, 1980 Age (years): 74 Referring Provider: Iran Planas Height (inches): 75 Interpreting Physician: Baird Lyons MD, ABSM Weight (lbs): 265 RPSGT: Gerhard Perches BMI: 33 MRN: [id number] Neck Size: 18.00  CLINICAL INFORMATION Sleep Study Type: HST Indication for sleep study: Snoring Epworth Sleepiness Score: 3  SLEEP STUDY TECHNIQUE A multi-channel overnight portable sleep study was performed. The channels recorded were: nasal airflow, thoracic respiratory movement, and oxygen saturation with a pulse oximetry. Snoring was also monitored.  MEDICATIONS Patient self administered medications include: none reported.  SLEEP ARCHITECTURE Patient was studied for 366.6 minutes. The sleep efficiency was 98.7 % and the patient was supine for 57.6%. The arousal index was 0.0 per hour.  RESPIRATORY PARAMETERS The overall AHI was 13.7 per hour, with a central apnea index of 0.0 per hour. The oxygen nadir was 87% during sleep.  CARDIAC DATA Mean heart rate during sleep was 71.8 bpm.  IMPRESSIONS - Mild obstructive sleep apnea occurred during this study (AHI = 13.7/h). - No significant central sleep apnea occurred during this study (CAI = 0.0/h). - Mild oxygen desaturation was noted during this study (Min O2 = 87%). Mean sat 92%. - Patient snored.  DIAGNOSIS - Obstructive Sleep Apnea (327.23 [G47.33 ICD-10])  RECOMMENDATIONS - Suggest CPAP titration sleep study or autopap. Other options would be based on clinical judgment. - Be careful with alcohol, sedatives and other CNS depressants that may worsen sleep apnea and disrupt normal sleep architecture. - Sleep hygiene should be reviewed to assess factors that may improve sleep quality. - Weight management and regular exercise should be initiated or continued.  [Electronically signed] 02/03/2019 10:53 AM  Baird Lyons MD, ABSM Diplomate,  American Board of Sleep Medicine   NPI: [id number]                         [address]  ELECTRONICALLY SIGNED ON:  02/03/2019, 10:50 AM [place] PH: [phone]   FX: [phone] [place]

## 2019-02-04 ENCOUNTER — Other Ambulatory Visit: Payer: Self-pay | Admitting: Neurology

## 2019-02-04 DIAGNOSIS — G4733 Obstructive sleep apnea (adult) (pediatric): Secondary | ICD-10-CM

## 2019-02-22 ENCOUNTER — Encounter: Payer: Self-pay | Admitting: Physician Assistant

## 2019-02-22 ENCOUNTER — Telehealth: Payer: Self-pay | Admitting: Neurology

## 2019-02-22 ENCOUNTER — Ambulatory Visit (INDEPENDENT_AMBULATORY_CARE_PROVIDER_SITE_OTHER): Payer: Managed Care, Other (non HMO) | Admitting: Physician Assistant

## 2019-02-22 VITALS — Ht 75.0 in | Wt 272.0 lb

## 2019-02-22 DIAGNOSIS — F902 Attention-deficit hyperactivity disorder, combined type: Secondary | ICD-10-CM

## 2019-02-22 DIAGNOSIS — G4733 Obstructive sleep apnea (adult) (pediatric): Secondary | ICD-10-CM | POA: Insufficient documentation

## 2019-02-22 MED ORDER — LISDEXAMFETAMINE DIMESYLATE 60 MG PO CAPS: 60.0000 mg | ORAL_CAPSULE | ORAL | 0 refills | Status: DC

## 2019-02-22 NOTE — Progress Notes (Signed)
Needs refills vyvanse. No issues. PHQ9-GAD7 completed.

## 2019-02-22 NOTE — Telephone Encounter (Signed)
Called [facility]. Threasa Beards states she has CPAP order and will process and contact the patient ASAP. She LMOM.

## 2019-02-22 NOTE — Progress Notes (Addendum)
Patient ID: Wesley Mckay, male   DOB: 1980/05/26, 39 y.o.   MRN: [id number] .Marland KitchenVirtual Visit via Video Note  I connected with [name] on 02/22/19 at  8:50 AM EST by a video enabled telemedicine application and verified that I am speaking with the correct person using two identifiers.  Location: Patient: home Provider: clinic   I discussed the limitations of evaluation and management by telemedicine and the availability of in person appointments. The patient expressed understanding and agreed to proceed.  History of Present Illness: Pt is a 39 yo male with ADHd who presents to clinic for medication refills.   He is doing well on vyvanse. No concerns. He denies any anxiety, palpitations, headaches. He is back to work and functioning well.   He had sleep study and has OSA. Never called for CPAP.   .. Active Ambulatory Problems    Diagnosis Date Noted  . Hypothyroidism 08/01/2007  . GERD 08/01/2007  . SEIZURE DISORDER 08/01/2007  . Primary insomnia 08/01/2007  . Anxiety disorder 10/04/2010  . 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 Bilateral foot pain 08/16/2012  . Bilateral plantar fasciitis 11/14/2014  . Inattention 11/17/2014  . No energy 11/17/2014  . Hyperlipidemia LDL goal <130 11/18/2014  . Attention deficit hyperactivity disorder (ADHD), predominantly inattentive type 12/19/2014  . Major depressive disorder, recurrent episode, moderate ([place]) 12/19/2014  . B12 deficiency 09/15/2015  . Sinus bradycardia 09/15/2015  . Seizure disorder ([place]) 02/29/2016  . Erectile dysfunction 02/29/2016  . Vitamin D deficiency 04/11/2016  . Anxiety 11/17/2016  . Muscle spasm 07/14/2017  . Neck pain 07/14/2017  . Attention deficit hyperactivity disorder (ADHD), combined type 07/14/2017  . 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 Nonspecific findings on examination of blood 09/18/2012  . Sleeping difficulty 09/18/2012  . Non-restorative sleep 10/30/2018  . Snoring 10/30/2018  .  Class 1 obesity due to excess calories without serious comorbidity with body mass index (BMI) of 34.0 to 34.9 in adult 10/30/2018  . 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 Elevated blood pressure reading 11/02/2018  . OSA (obstructive sleep apnea) 02/22/2019   Resolved Ambulatory Problems    Diagnosis Date Noted  . FOOT PAIN, RIGHT 11/29/2007   Past Medical History:  Diagnosis Date  . GERD (gastroesophageal reflux disease)   . History of dislocation of shoulder   . Insomnia   . Obesity   . Seizures ([place]) 03/2007  . Thyroid disease    Reviewed med, allergy, problem list.    Observations/Objective: No acute distress.  Normal mood and appearance.   .. Today's Vitals   02/22/19 0814  Weight: 272 lb (123.4 kg)  Height: 6\' 3"  (1.905 m)   Body mass index is 34 kg/m.    Assessment and Plan: Marland KitchenMarland KitchenDamean was seen today for adhd.  Diagnoses and all orders for this visit:  Attention deficit hyperactivity disorder (ADHD), combined type -     lisdexamfetamine (VYVANSE) 60 MG capsule; Take 1 capsule (60 mg total) by mouth every morning. -     lisdexamfetamine (VYVANSE) 60 MG capsule; Take 1 capsule (60 mg total) by mouth every morning. -     lisdexamfetamine (VYVANSE) 60 MG capsule; Take 1 capsule (60 mg total) by mouth every morning.  OSA (obstructive sleep apnea)   Refilled for 3 months. Doing well.   Pt has not been for sleep study called originally sent order on 12/21. Will resend today.   Spent 12 minutes with patient and including ordering test and chart review.   Follow  Up Instructions:    I discussed the assessment and treatment plan with the patient. The patient was provided an opportunity to ask questions and all were answered. The patient agreed with the plan and demonstrated an understanding of the instructions.   The patient was advised to call back or seek an in-person evaluation if the symptoms worsen or if the condition fails to improve as anticipated.    Iran Planas, PA-C

## 2019-03-13 ENCOUNTER — Other Ambulatory Visit: Payer: Self-pay | Admitting: Physician Assistant

## 2019-03-13 DIAGNOSIS — E538 Deficiency of other specified B group vitamins: Secondary | ICD-10-CM

## 2019-03-13 NOTE — Telephone Encounter (Signed)
Looks like last time filled was 01/08/18 for 10 mL.  Did have B12 drawn in 07/2018. Please advise if he should still be on injections?

## 2019-03-26 NOTE — Addendum Note (Signed)
Addended by: Donella Stade on: 03/26/2019 12:01 PM   Modules accepted: Level of Service

## 2019-05-08 ENCOUNTER — Other Ambulatory Visit: Payer: Self-pay | Admitting: Physician Assistant

## 2019-05-08 ENCOUNTER — Telehealth: Payer: Self-pay | Admitting: Physician Assistant

## 2019-05-08 DIAGNOSIS — F5101 Primary insomnia: Secondary | ICD-10-CM

## 2019-05-08 NOTE — Telephone Encounter (Signed)
Received fax for [place] on Testosterone sent through cover my meds waiting on determination. - CF 

## 2019-05-09 NOTE — Telephone Encounter (Signed)
Received fax from [place] and they approved coverage on Testosterone from 05/09/2019 - 05/08/2020. Claim Number: [id number] - CF

## 2019-05-13 ENCOUNTER — Other Ambulatory Visit: Payer: Self-pay | Admitting: Physician Assistant

## 2019-05-13 DIAGNOSIS — N529 Male erectile dysfunction, unspecified: Secondary | ICD-10-CM

## 2019-05-13 NOTE — Telephone Encounter (Signed)
Last appt 02/22/2019, due for follow up next month.  Last filled 01/02/2019 #30 with 5 refills.

## 2019-06-20 ENCOUNTER — Other Ambulatory Visit: Payer: Self-pay | Admitting: Family Medicine

## 2019-06-20 DIAGNOSIS — N529 Male erectile dysfunction, unspecified: Secondary | ICD-10-CM

## 2019-07-04 ENCOUNTER — Other Ambulatory Visit: Payer: Self-pay | Admitting: Neurology

## 2019-07-04 DIAGNOSIS — E291 Testicular hypofunction: Secondary | ICD-10-CM

## 2019-07-04 NOTE — Telephone Encounter (Signed)
Last filled in September for 6 month supply. Labs done in September. Last appt 02/22/2019.

## 2019-07-05 NOTE — Telephone Encounter (Signed)
Please schedule appt

## 2019-07-05 NOTE — Telephone Encounter (Signed)
Needs f/u appt for testos, ADD and labwork

## 2019-07-05 NOTE — Telephone Encounter (Signed)
Appointment has been made. No further questions at this time.  

## 2019-07-24 ENCOUNTER — Ambulatory Visit (INDEPENDENT_AMBULATORY_CARE_PROVIDER_SITE_OTHER): Payer: Managed Care, Other (non HMO) | Admitting: Physician Assistant

## 2019-07-24 ENCOUNTER — Encounter: Payer: Self-pay | Admitting: Physician Assistant

## 2019-07-24 VITALS — BP 140/83 | HR 73 | Ht 75.0 in | Wt 251.0 lb

## 2019-07-24 DIAGNOSIS — E291 Testicular hypofunction: Secondary | ICD-10-CM

## 2019-07-24 DIAGNOSIS — M542 Cervicalgia: Secondary | ICD-10-CM

## 2019-07-24 DIAGNOSIS — F902 Attention-deficit hyperactivity disorder, combined type: Secondary | ICD-10-CM | POA: Diagnosis not present

## 2019-07-24 DIAGNOSIS — M62838 Other muscle spasm: Secondary | ICD-10-CM

## 2019-07-24 DIAGNOSIS — Z13220 Encounter for screening for lipoid disorders: Secondary | ICD-10-CM

## 2019-07-24 DIAGNOSIS — F5101 Primary insomnia: Secondary | ICD-10-CM

## 2019-07-24 DIAGNOSIS — F419 Anxiety disorder, unspecified: Secondary | ICD-10-CM

## 2019-07-24 DIAGNOSIS — E039 Hypothyroidism, unspecified: Secondary | ICD-10-CM

## 2019-07-24 DIAGNOSIS — K047 Periapical abscess without sinus: Secondary | ICD-10-CM

## 2019-07-24 MED ORDER — LISDEXAMFETAMINE DIMESYLATE 60 MG PO CAPS: 60.0000 mg | ORAL_CAPSULE | ORAL | 0 refills | Status: DC

## 2019-07-24 MED ORDER — CLONAZEPAM 0.5 MG PO TABS: 0.5000 mg | ORAL_TABLET | Freq: Every evening | ORAL | 5 refills | Status: DC | PRN

## 2019-07-24 MED ORDER — LEVOTHYROXINE SODIUM 150 MCG PO TABS: 300.0000 ug | ORAL_TABLET | Freq: Every day | ORAL | 5 refills | Status: DC

## 2019-07-24 MED ORDER — CELECOXIB 200 MG PO CAPS: ORAL_CAPSULE | ORAL | 1 refills | Status: DC

## 2019-07-24 MED ORDER — CYCLOBENZAPRINE HCL 10 MG PO TABS: ORAL_TABLET | ORAL | 2 refills | Status: DC

## 2019-07-24 MED ORDER — AMOXICILLIN-POT CLAVULANATE 875-125 MG PO TABS: 1.0000 | ORAL_TABLET | Freq: Two times a day (BID) | ORAL | 0 refills | Status: DC

## 2019-07-24 MED ORDER — TESTOSTERONE CYPIONATE 200 MG/ML IM SOLN: INTRAMUSCULAR | 5 refills | Status: DC

## 2019-07-24 MED ORDER — ZOLPIDEM TARTRATE 10 MG PO TABS: ORAL_TABLET | ORAL | 5 refills | Status: DC

## 2019-07-24 MED ORDER — TESTOSTERONE CYPIONATE 200 MG/ML IM SOLN
300.0000 mg | Freq: Once | INTRAMUSCULAR | Status: AC
  Administered 2019-07-24: 300 mg via INTRAMUSCULAR

## 2019-07-24 NOTE — Progress Notes (Signed)
Subjective:    Patient ID: Wesley Mckay, Wesley Mckay    DOB: [birth year], 39 y.o.   MRN: [id number]  HPI  Pt is a 39 yo Wesley Mckay with OsA, hypothyroidism, hypogonadism, seizures, ADHD, anxiety, HLD, MDD and insomnia who presents to the clinic for 3 month refills.   He brings in disk today of neck. He has had increasing pain and stiffiness in neck for the last 4 months. At times it has ran down left pinky and ring finger and left them numb. No injury. That has gotten a lot better. Diclofenac and mobic not helping. He is going to chiropractor regularly.   ADHD- doing well. No concerns or compliants.   Pt had a dental abscess he has to get all upper palate teeth pulled on 14th. He is worried the infection will come back. He has finished augmentin. No pain, fever, chills.   Pt is tired and feels like CPAP not helping. Using for over 6 months.   Pt is out of testosterone. He is a week late. Needs shot today. Doing well no concerns.   .. Active Ambulatory Problems    Diagnosis Date Noted   Hypothyroidism 08/01/2007   GERD 08/01/2007   SEIZURE DISORDER 08/01/2007   Primary insomnia 08/01/2007   Anxiety disorder 10/04/2010   Wesley Mckay hypogonadism 08/29/2011   Bilateral foot pain 08/16/2012   Bilateral plantar fasciitis 11/14/2014   Inattention 11/17/2014   No energy 11/17/2014   Hyperlipidemia LDL goal <130 11/18/2014   Attention deficit hyperactivity disorder (ADHD), predominantly inattentive type 12/19/2014   Major depressive disorder, recurrent episode, moderate ([place]) 12/19/2014   B12 deficiency 09/15/2015   Sinus bradycardia 09/15/2015   Seizure disorder ([place]) 02/29/2016   Erectile dysfunction 02/29/2016   Vitamin D deficiency 04/11/2016   Anxiety 11/17/2016   Muscle spasm 07/14/2017   Neck pain 07/14/2017   Attention deficit hyperactivity disorder (ADHD), combined type 07/14/2017   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   Nonspecific findings on examination  of blood 09/18/2012   Sleeping difficulty 09/18/2012   Non-restorative sleep 10/30/2018   Snoring 10/30/2018   Class 1 obesity due to excess calories without serious comorbidity with body mass index (BMI) of 34.0 to 34.9 in adult 10/30/2018   Suspicious nevus 11/02/2018   Elevated blood pressure reading 11/02/2018   OSA (obstructive sleep apnea) 02/22/2019   Resolved Ambulatory Problems    Diagnosis Date Noted   FOOT PAIN, RIGHT 11/29/2007   Past Medical History:  Diagnosis Date   GERD (gastroesophageal reflux disease)    History of dislocation of shoulder    Insomnia    Obesity    Seizures ([place]) 03/2007   Thyroid disease      Review of Systems See HPI.     Objective:   Physical Exam Vitals reviewed.  Constitutional:      Appearance: Normal appearance.  HENT:     Head: Normocephalic.     Mouth/Throat:     Comments: Poor dentition with plaque, erythematous gums and broken teeth.  Neck:     Vascular: No carotid bruit.  Cardiovascular:     Rate and Rhythm: Normal rate and regular rhythm.     Pulses: Normal pulses.  Pulmonary:     Effort: Pulmonary effort is normal.     Breath sounds: Normal breath sounds.  Musculoskeletal:     Right lower leg: No edema.     Left lower leg: No edema.     Comments: Paraspinal cervical neck tightness and tenderness. No cervical tenderness.  Upper ext strength 5/5.  NROM of neck.  Negative spurlings.   Neurological:     General: No focal deficit present.     Mental Status: He is alert and oriented to person, place, and time.  Psychiatric:        Mood and Affect: Mood normal.        Behavior: Behavior normal.           Assessment & Plan:  Marland KitchenMarland KitchenCasmere was seen today for adhd.  Diagnoses and all orders for this visit:  Attention deficit hyperactivity disorder (ADHD), combined type -     lisdexamfetamine (VYVANSE) 60 MG capsule; Take 1 capsule (60 mg total) by mouth every morning. -     lisdexamfetamine (VYVANSE)  60 MG capsule; Take 1 capsule (60 mg total) by mouth every morning. -     lisdexamfetamine (VYVANSE) 60 MG capsule; Take 1 capsule (60 mg total) by mouth every morning.  Primary insomnia -     COMPLETE METABOLIC PANEL WITH GFR -     zolpidem (AMBIEN) 10 MG tablet; TAKE 1 TABLET(10 MG) BY MOUTH AT BEDTIME AS NEEDED FOR SLEEP  Wesley Mckay hypogonadism -     Testosterone -     COMPLETE METABOLIC PANEL WITH GFR -     CBC -     PSA -     testosterone cypionate (DEPOTESTOSTERONE CYPIONATE) 200 MG/ML injection; ADMINISTER 1.5 ML(300 MG) IN THE MUSCLE EVERY 14 DAYS -     testosterone cypionate (DEPOTESTOSTERONE CYPIONATE) injection 300 mg  Neck pain -     celecoxib (CELEBREX) 200 MG capsule; One to 2 tablets by mouth daily as needed for pain. -     cyclobenzaprine (FLEXERIL) 10 MG tablet; TAKE 1 TABLET(10 MG) BY MOUTH THREE TIMES DAILY AS NEEDED FOR MUSCLE SPASMS  Muscle spasm -     cyclobenzaprine (FLEXERIL) 10 MG tablet; TAKE 1 TABLET(10 MG) BY MOUTH THREE TIMES DAILY AS NEEDED FOR MUSCLE SPASMS  Hypothyroidism, unspecified type -     TSH -     COMPLETE METABOLIC PANEL WITH GFR -     levothyroxine (SYNTHROID) 150 MCG tablet; Take 2 tablets (300 mcg total) by mouth daily before breakfast.  Anxiety -     clonazePAM (KLONOPIN) 0.5 MG tablet; Take 1 tablet (0.5 mg total) by mouth at bedtime as needed.  Screening for lipid disorders -     Lipid Panel w/reflex Direct LDL  Dental abscess -     amoxicillin-clavulanate (AUGMENTIN) 875-125 MG tablet; Take 1 tablet by mouth 2 (two) times daily.   Try celebrex for neck pain. Discussed conservative symptomatic treatment.  Refilled flexeril.   Labs ordered and will adjust accordingly.   Vyvanse refilled for 3 months.   augmentin given. Hold for now. Use if pain/symptoms return before dentist appt.   Testosterone shot given today. Do labs in 2 weeks.

## 2019-07-26 ENCOUNTER — Encounter: Payer: Self-pay | Admitting: Physician Assistant

## 2019-08-02 ENCOUNTER — Other Ambulatory Visit: Payer: Self-pay | Admitting: Physician Assistant

## 2019-08-02 DIAGNOSIS — E291 Testicular hypofunction: Secondary | ICD-10-CM

## 2019-08-02 MED ORDER — TESTOSTERONE CYPIONATE 200 MG/ML IM SOLN: INTRAMUSCULAR | 5 refills | Status: DC

## 2019-08-08 ENCOUNTER — Other Ambulatory Visit: Payer: Self-pay | Admitting: Physician Assistant

## 2019-08-08 DIAGNOSIS — N529 Male erectile dysfunction, unspecified: Secondary | ICD-10-CM

## 2019-08-08 NOTE — Telephone Encounter (Signed)
Last filled 06/20/2019 #30 with one refill Last appt 07/24/2019.

## 2019-08-31 ENCOUNTER — Other Ambulatory Visit: Payer: Self-pay | Admitting: Physician Assistant

## 2019-08-31 DIAGNOSIS — E538 Deficiency of other specified B group vitamins: Secondary | ICD-10-CM

## 2019-09-12 ENCOUNTER — Encounter: Payer: Self-pay | Admitting: Physician Assistant

## 2019-09-12 NOTE — Telephone Encounter (Signed)
Rx not listed in active med list.  

## 2019-09-22 ENCOUNTER — Other Ambulatory Visit: Payer: Self-pay | Admitting: Physician Assistant

## 2019-09-22 DIAGNOSIS — E039 Hypothyroidism, unspecified: Secondary | ICD-10-CM

## 2019-11-12 ENCOUNTER — Encounter: Payer: Self-pay | Admitting: Physician Assistant

## 2019-11-13 MED ORDER — AMOXICILLIN-POT CLAVULANATE 875-125 MG PO TABS
1.0000 | ORAL_TABLET | Freq: Two times a day (BID) | ORAL | 0 refills | Status: AC
Start: 2019-11-13 — End: 2019-11-23

## 2019-12-09 ENCOUNTER — Encounter (HOSPITAL_COMMUNITY): Payer: Self-pay

## 2019-12-09 ENCOUNTER — Emergency Department (HOSPITAL_COMMUNITY)
Admission: EM | Admit: 2019-12-09 | Discharge: 2019-12-10 | Disposition: A | Discharge status: Home / Self Care | Payer: 59 | Attending: Emergency Medicine | Admitting: Emergency Medicine

## 2019-12-09 ENCOUNTER — Emergency Department (HOSPITAL_COMMUNITY): Payer: 59

## 2019-12-09 ENCOUNTER — Other Ambulatory Visit: Payer: Self-pay

## 2019-12-09 DIAGNOSIS — R251 Tremor, unspecified: Secondary | ICD-10-CM | POA: Insufficient documentation

## 2019-12-09 DIAGNOSIS — R32 Unspecified urinary incontinence: Secondary | ICD-10-CM | POA: Insufficient documentation

## 2019-12-09 DIAGNOSIS — E039 Hypothyroidism, unspecified: Secondary | ICD-10-CM | POA: Diagnosis not present

## 2019-12-09 DIAGNOSIS — F17210 Nicotine dependence, cigarettes, uncomplicated: Secondary | ICD-10-CM | POA: Diagnosis not present

## 2019-12-09 DIAGNOSIS — Z79899 Other long term (current) drug therapy: Secondary | ICD-10-CM | POA: Insufficient documentation

## 2019-12-09 DIAGNOSIS — R569 Unspecified convulsions: Secondary | ICD-10-CM | POA: Diagnosis present

## 2019-12-09 DIAGNOSIS — M25511 Pain in right shoulder: Secondary | ICD-10-CM | POA: Insufficient documentation

## 2019-12-09 LAB — CBC WITH DIFFERENTIAL/PLATELET
Abs Immature Granulocytes: 0.14 10*3/uL — ABNORMAL HIGH (ref 0.00–0.07)
Basophils Absolute: 0.1 10*3/uL (ref 0.0–0.1)
Basophils Relative: 1 %
Eosinophils Absolute: 0.1 10*3/uL (ref 0.0–0.5)
Eosinophils Relative: 1 %
HCT: 48 % (ref 39.0–52.0)
Hemoglobin: 16.3 g/dL (ref 13.0–17.0)
Immature Granulocytes: 1 %
Lymphocytes Relative: 15 %
Lymphs Abs: 1.6 10*3/uL (ref 0.7–4.0)
MCH: 30.3 pg (ref 26.0–34.0)
MCHC: 34 g/dL (ref 30.0–36.0)
MCV: 89.2 fL (ref 80.0–100.0)
Monocytes Absolute: 0.6 10*3/uL (ref 0.1–1.0)
Monocytes Relative: 5 %
Neutro Abs: 8.7 10*3/uL — ABNORMAL HIGH (ref 1.7–7.7)
Neutrophils Relative %: 77 %
Platelets: 307 10*3/uL (ref 150–400)
RBC: 5.38 MIL/uL (ref 4.22–5.81)
RDW: 13.8 % (ref 11.5–15.5)
WBC: 11.1 10*3/uL — ABNORMAL HIGH (ref 4.0–10.5)
nRBC: 0 % (ref 0.0–0.2)

## 2019-12-09 LAB — BASIC METABOLIC PANEL
Anion gap: 13 (ref 5–15)
BUN: 9 mg/dL (ref 6–20)
CO2: 21 mmol/L — ABNORMAL LOW (ref 22–32)
Calcium: 9.7 mg/dL (ref 8.9–10.3)
Chloride: 103 mmol/L (ref 98–111)
Creatinine, Ser: 1.31 mg/dL — ABNORMAL HIGH (ref 0.61–1.24)
GFR, Estimated: >60 mL/min (ref >60)
Glucose, Bld: 107 mg/dL — ABNORMAL HIGH (ref 70–99)
Potassium: 4.4 mmol/L (ref 3.5–5.1)
Sodium: 137 mmol/L (ref 135–145)

## 2019-12-09 LAB — MAGNESIUM: Magnesium: 2.6 mg/dL — ABNORMAL HIGH (ref 1.7–2.4)

## 2019-12-09 IMAGING — CR DG SHOULDER 2+V*R*
3 series · 3 of 3 positions shown · non-contrast
Comparison: None.

CLINICAL DATA: Status post seizure.

EXAM:
RIGHT SHOULDER - 2+ VIEW

[x shoulder ap right (1 of 3)]
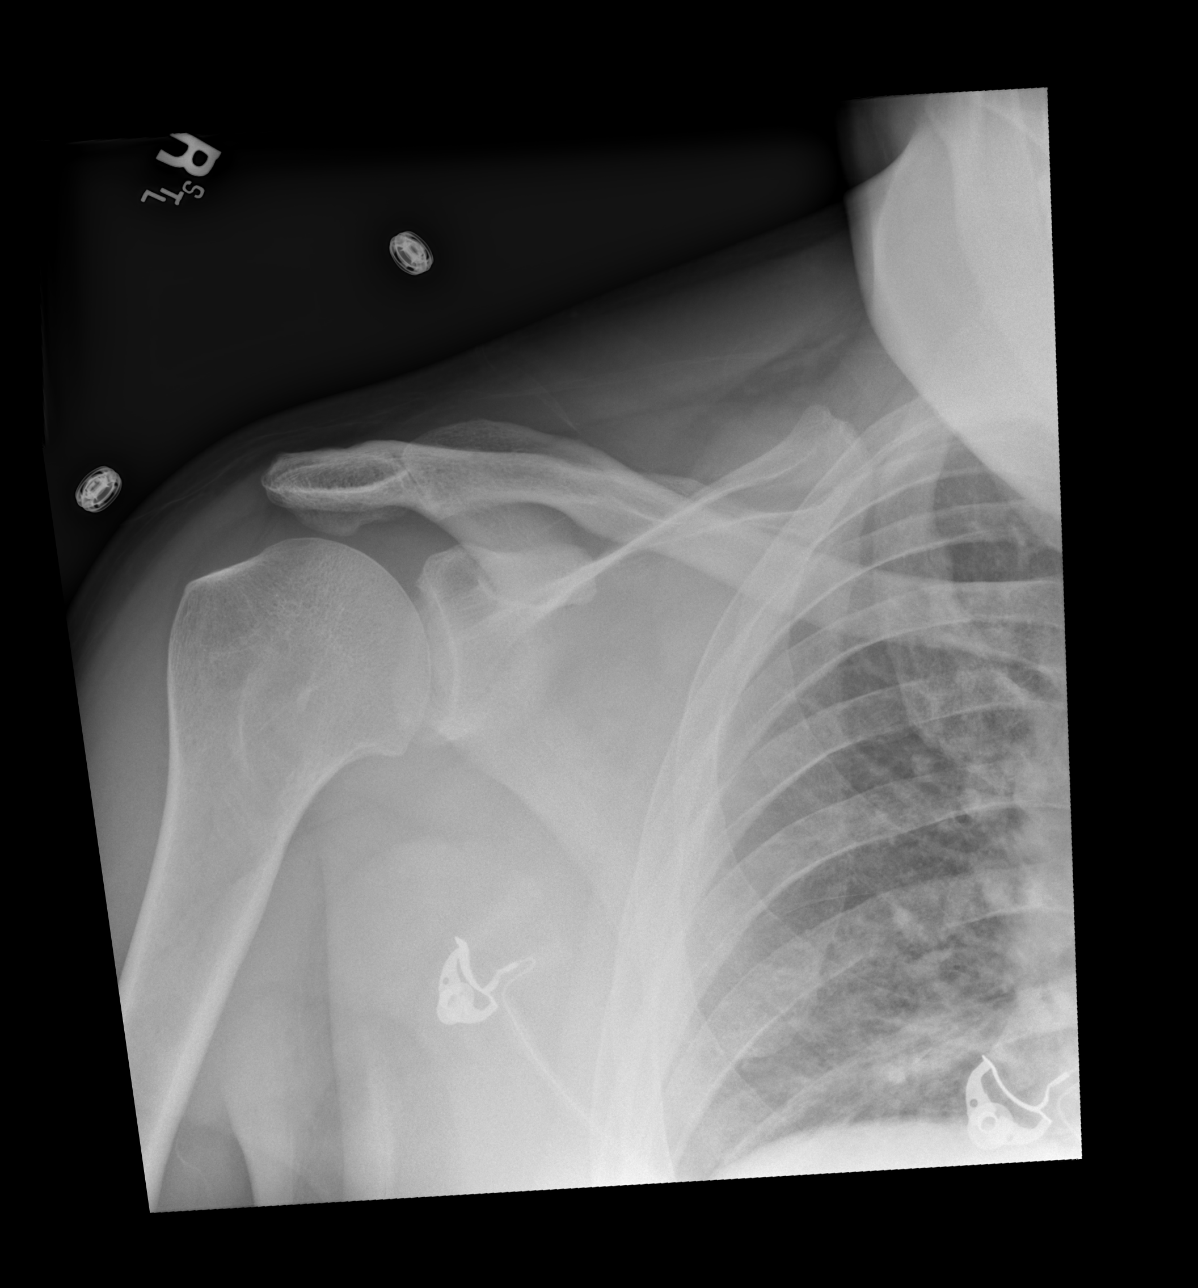

[x shoulder ap right (2 of 3)]
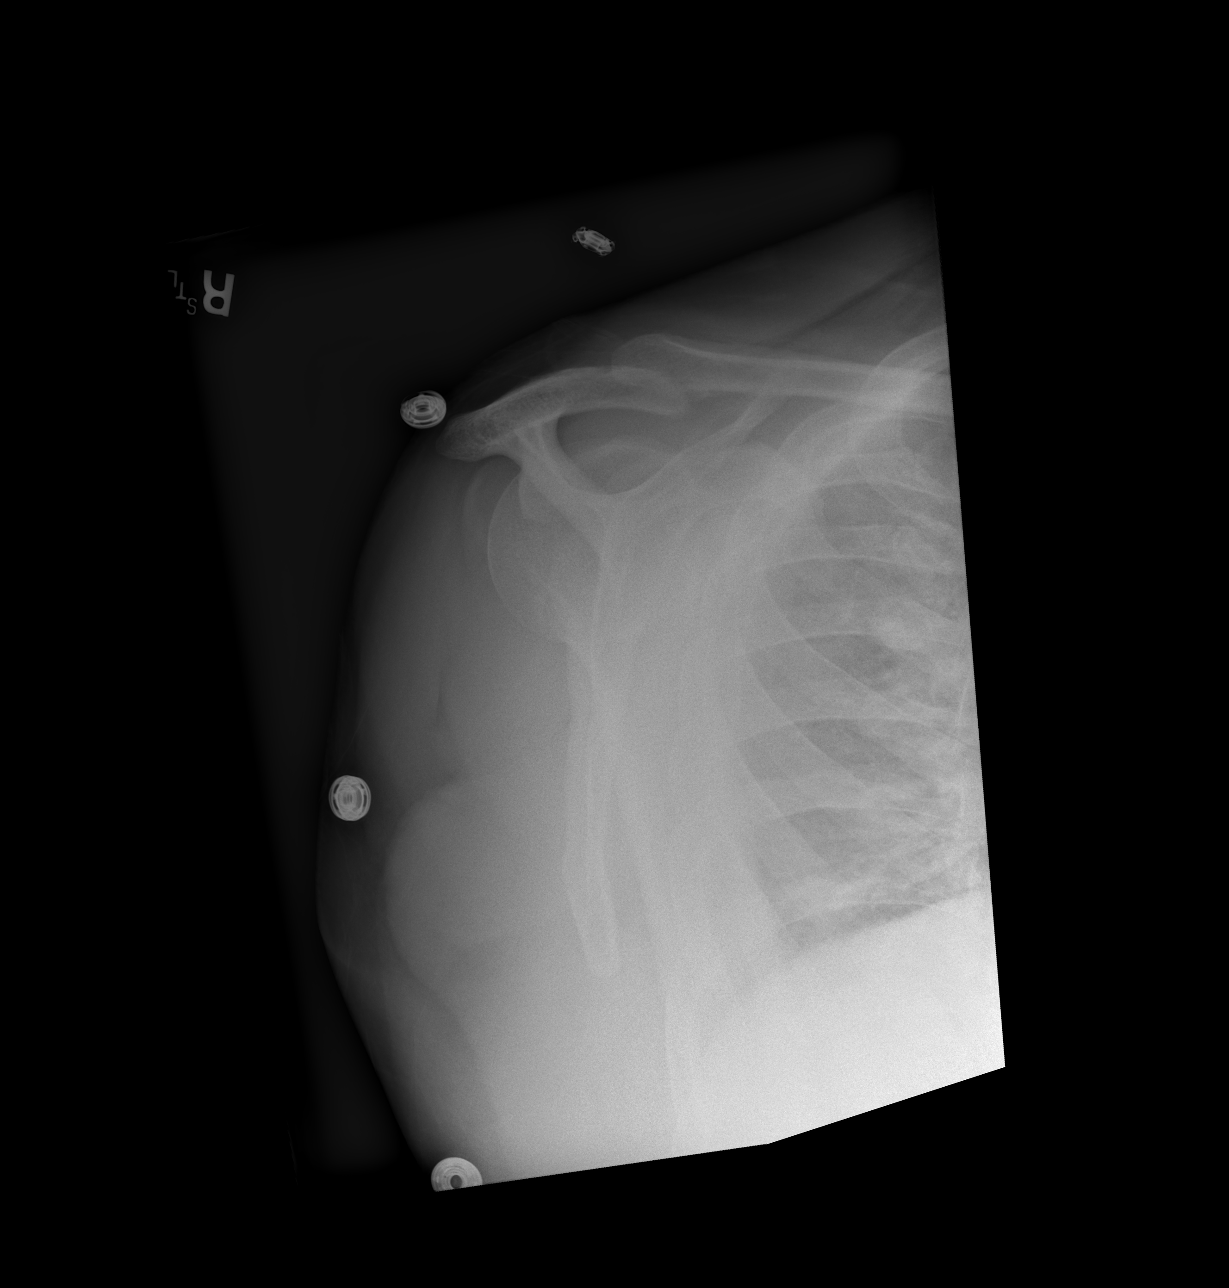

[x shoulder ap right (3 of 3)]
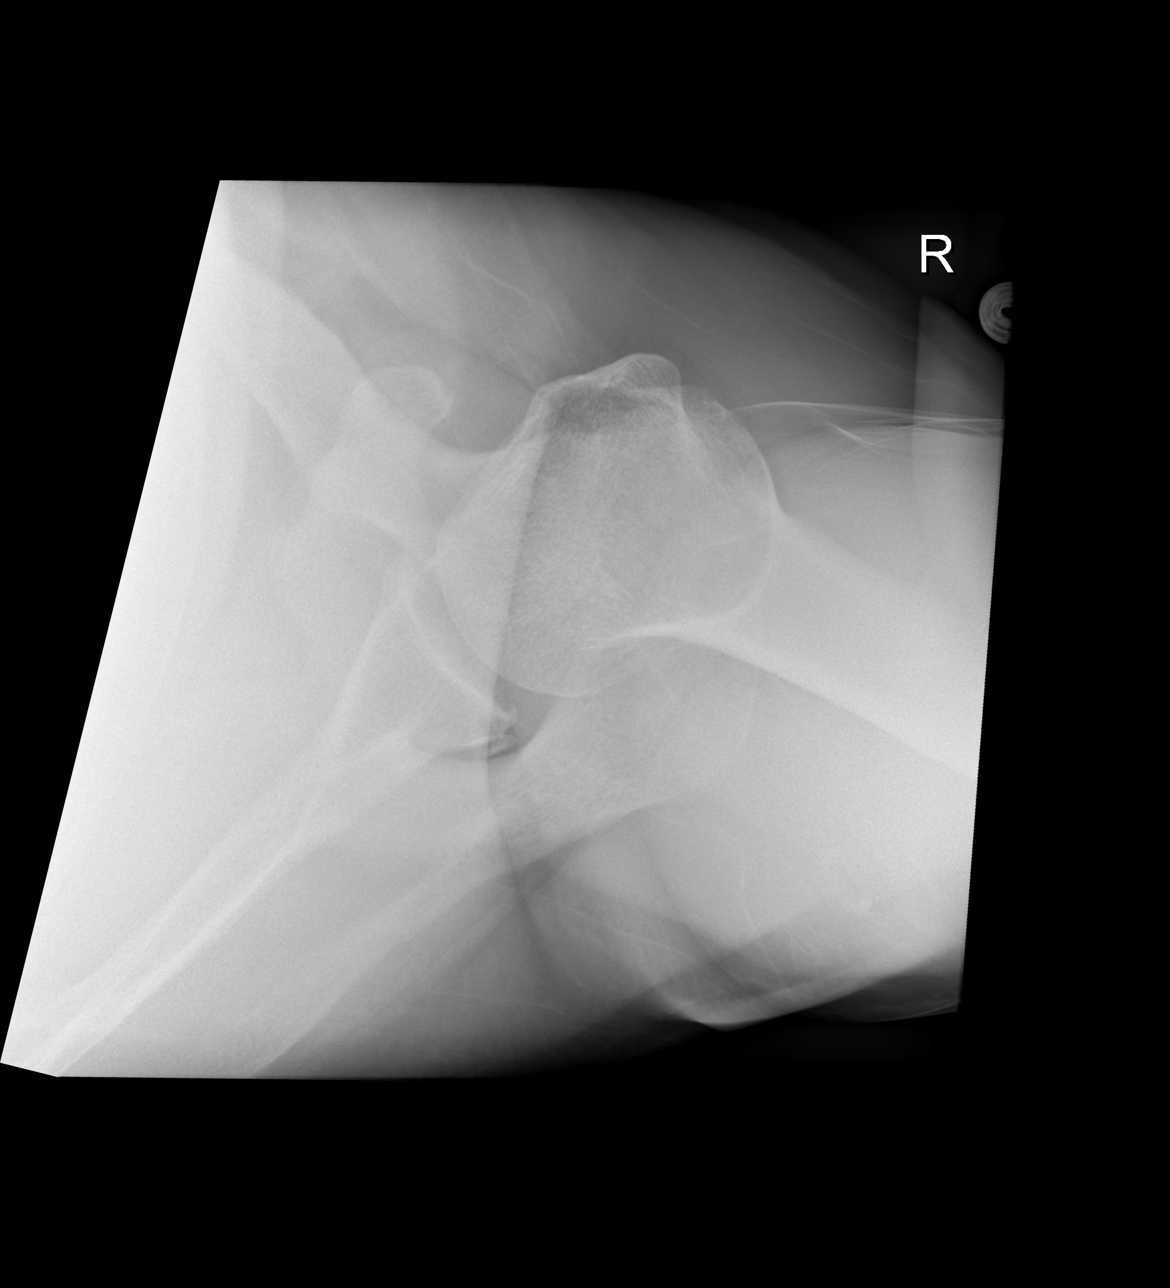

[3 of 3 positions shown; findings below may reference images not displayed]

FINDINGS: There is no evidence of an acute fracture or dislocation. There is
no evidence of arthropathy or other focal bone abnormality. Soft
tissues are unremarkable.
IMPRESSION: Negative.

## 2019-12-09 IMAGING — CT CT HEAD W/O CM
3 series · 16 of 47 positions shown, 19 images · non-contrast
Comparison: None.

CLINICAL DATA: Seizure.

EXAM:
CT HEAD WITHOUT CONTRAST
TECHNIQUE: Contiguous axial images were obtained from the base of the skull
through the vertex without intravenous contrast.

[Series 2: head wo · axial · 0.49mm/px · z∈[-139,+6]mm · 10 of 35 slices shown, 13 images]
[im 3/35  brain]
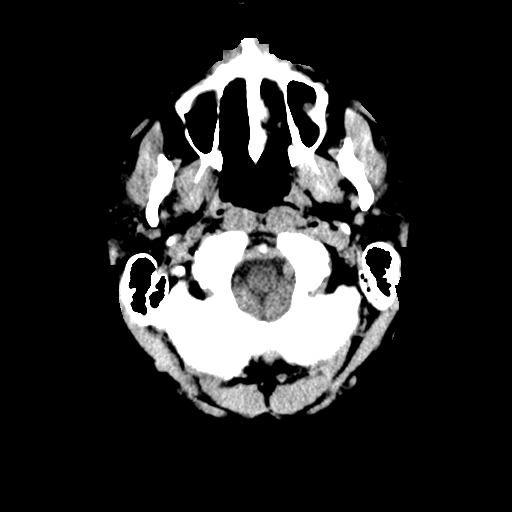
[im 3/35  bone]
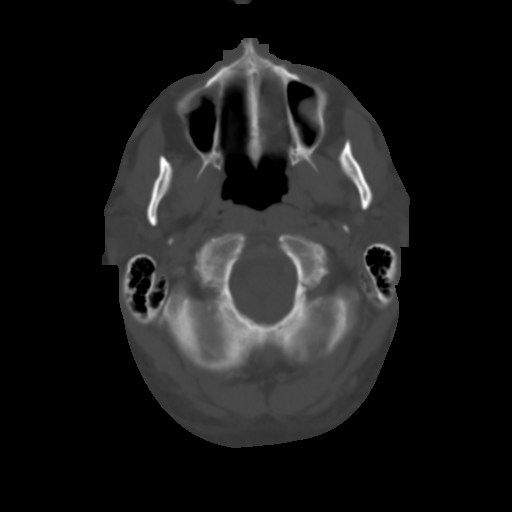
[im 6/35  brain]
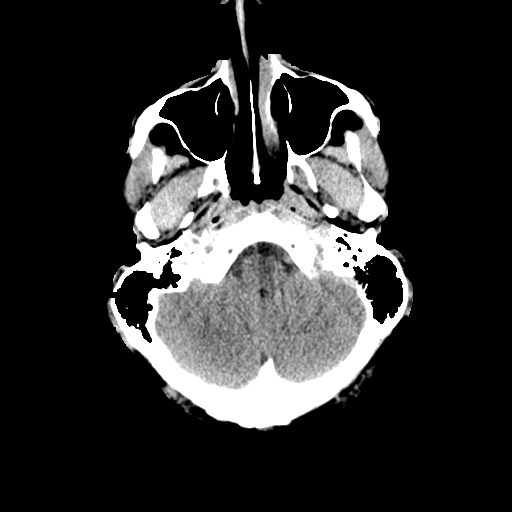
[im 10/35  brain]
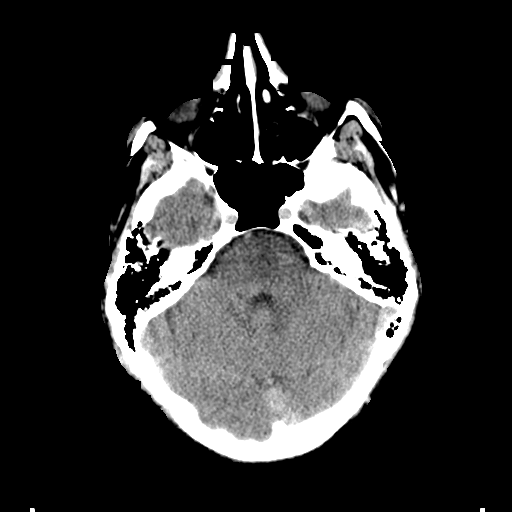
[im 12/35  brain]
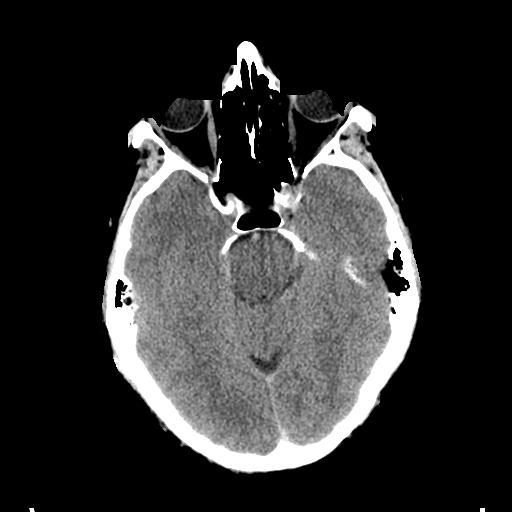
[im 16/35  brain]
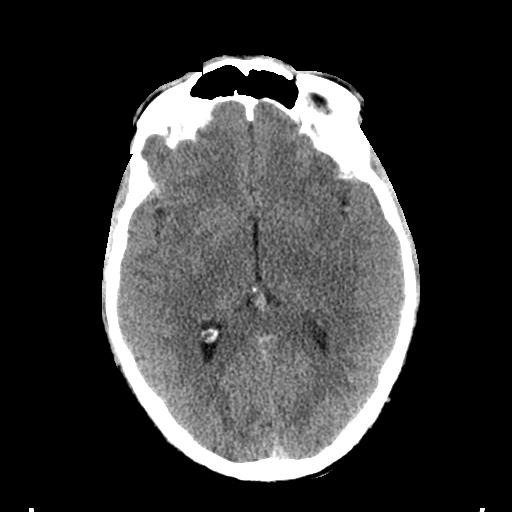
[im 16/35  bone]
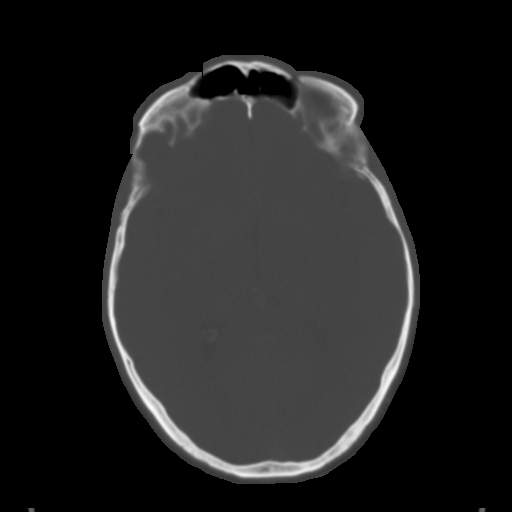
[im 19/35  brain]
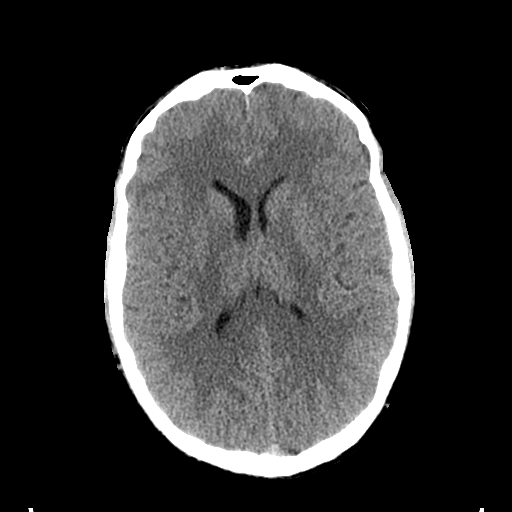
[im 23/35  brain]
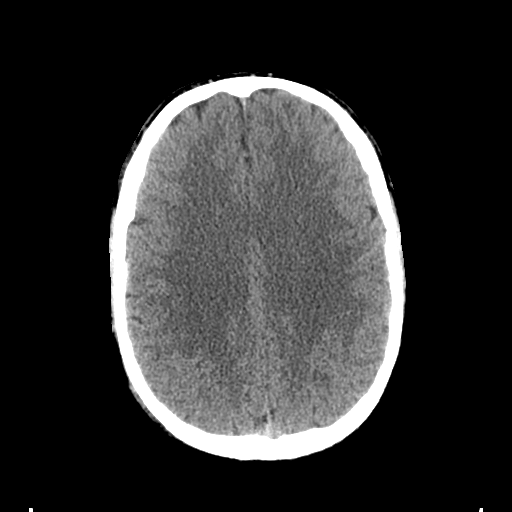
[im 26/35  brain]
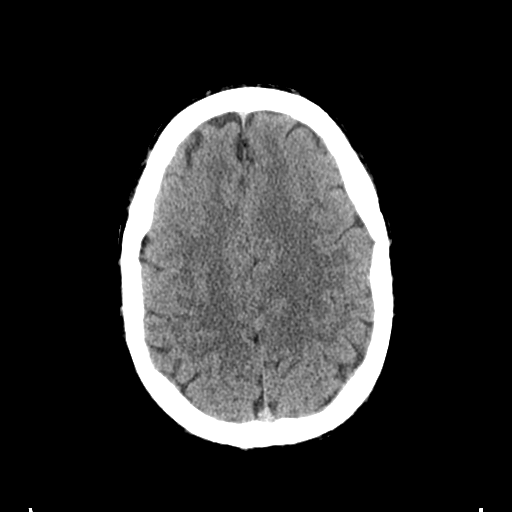
[im 29/35  brain]
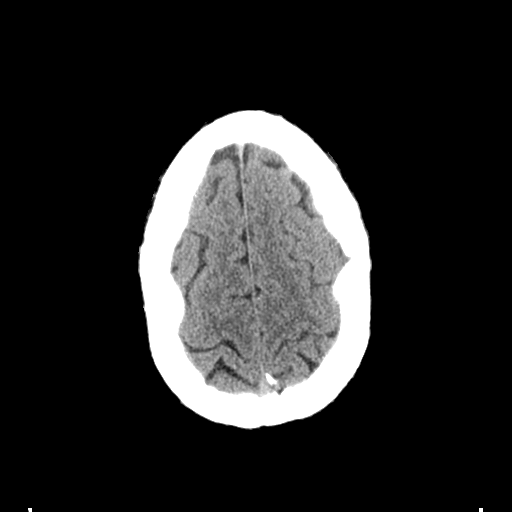
[im 29/35  bone]
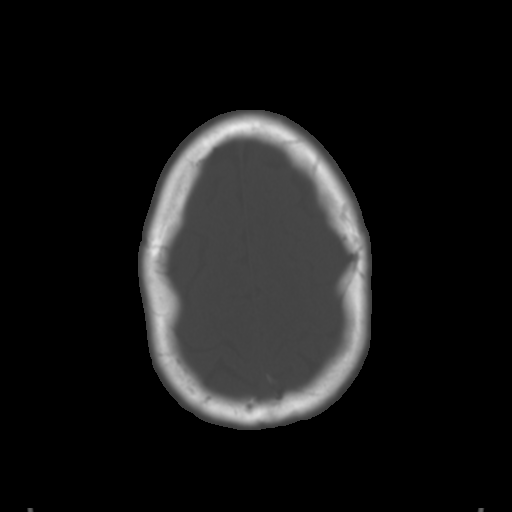
[im 32/35  brain]
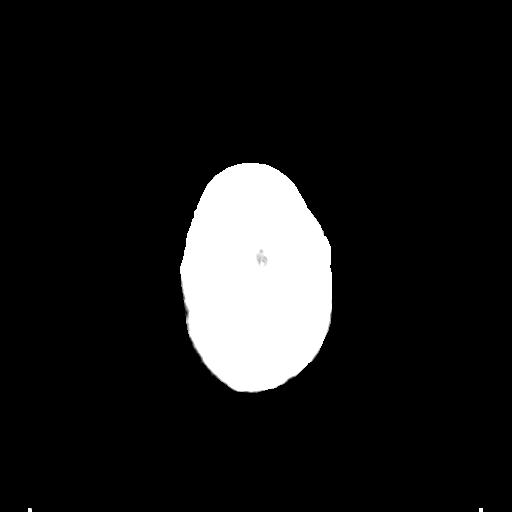

[Series 5: coronal soft tissue · coronal · 0.35mm/px · 3 of 78 slices shown]
[im 30/78  brain]
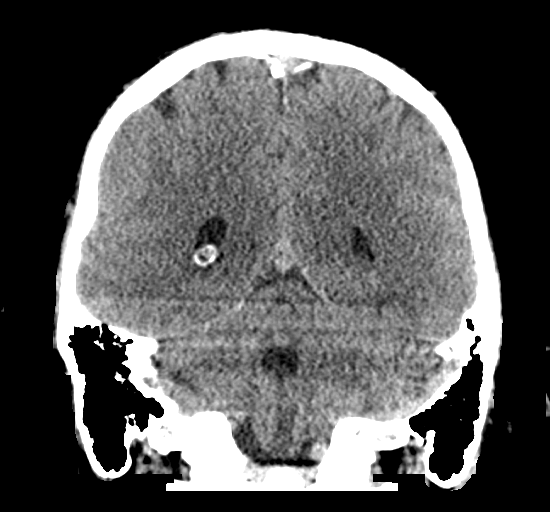
[im 36/78  brain]
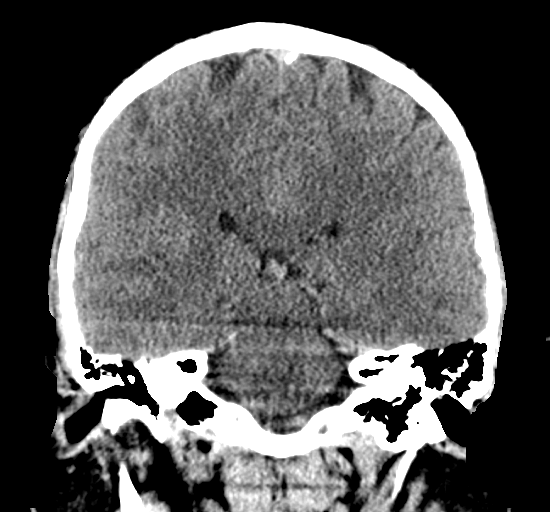
[im 42/78  brain]
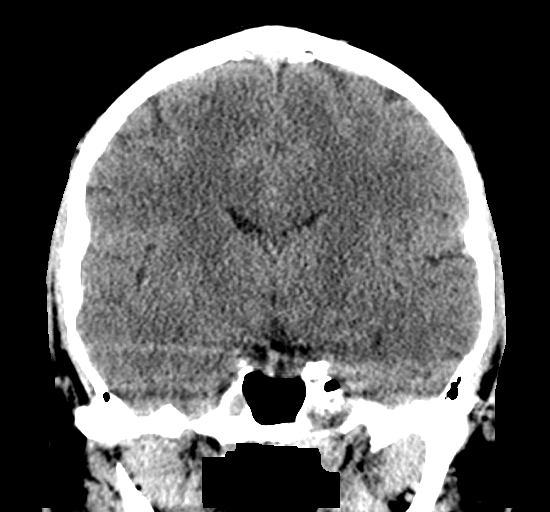

[Series 6: sagittal soft tissue · sagittal · 0.35mm/px · 3 of 64 slices shown]
[im 22/64  brain]
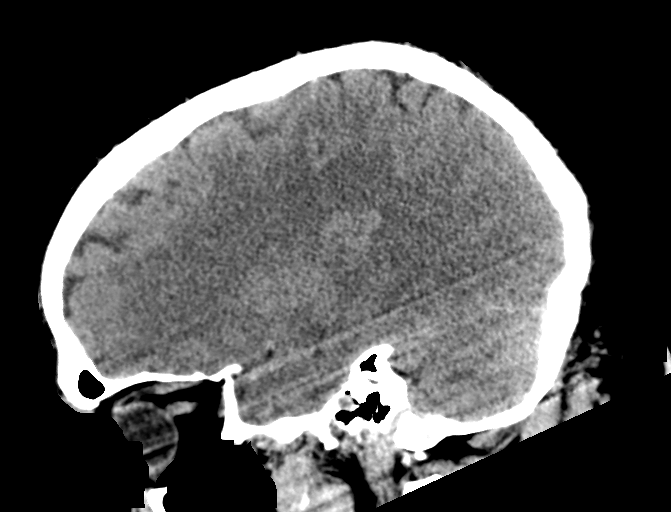
[im 32/64  brain]
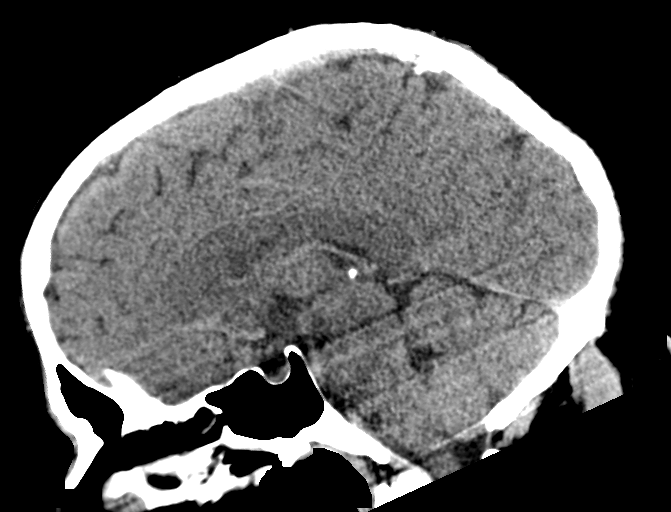
[im 43/64  brain]
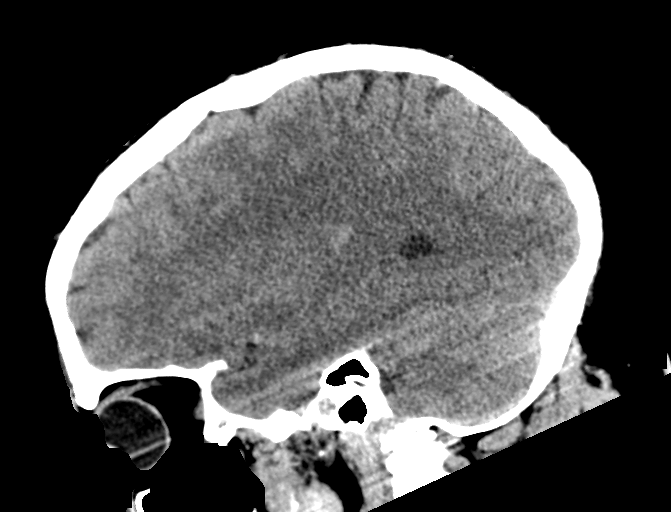

[16 of 47 positions shown; findings below may reference images not displayed]

FINDINGS: Brain: No evidence of acute infarction, hemorrhage, hydrocephalus,
extra-axial collection or mass lesion/mass effect.

Vascular: No hyperdense vessel or unexpected calcification.

Skull: Normal. Negative for fracture or focal lesion.

Sinuses/Orbits: No acute finding.

Other: None.
IMPRESSION: No acute intracranial pathology.

## 2019-12-09 MED ORDER — ZONISAMIDE 100 MG PO CAPS
300.0000 mg | ORAL_CAPSULE | Freq: Every day | ORAL | 0 refills | Status: DC
Start: 2019-12-09 — End: 2019-12-12

## 2019-12-09 MED ORDER — LEVETIRACETAM IN NACL 1000 MG/100ML IV SOLN
1000.0000 mg | Freq: Once | INTRAVENOUS | Status: AC
  Administered 2019-12-09: 1000 mg via INTRAVENOUS
  Filled 2019-12-09: qty 100

## 2019-12-09 NOTE — ED Notes (Signed)
Patient returned from CT and placed back on the cardiac monitor.

## 2019-12-09 NOTE — ED Triage Notes (Signed)
Patient was at a store, yelled , and was found on the floor "shaking", urinary incontinence noted, postictal upon EMS arrival, currently a/o x4.  Patient does states history of seizures but was taken off meds 5 years ago.

## 2019-12-09 NOTE — Discharge Instructions (Signed)
You were seen in the emergency department for a seizure.  You had a head CT and lab work that did not show any serious abnormalities.  We reviewed your case with neurology and they are recommending starting you back on a seizure medication.  Since Keppra gave you problems we are starting you on zonisamide.  You have an allergy to sulfa antibiotics and there is a small chance that you may be back to this medicine so please use caution.  We are also referring you on to neurology here in [place] so you can follow-up.  You will be unable to drive until you follow-up with neurology

## 2019-12-09 NOTE — ED Provider Notes (Signed)
[facility] DEPT Provider Note   CSN: [id number] Arrival date & time: 12/09/19  2210     History Chief Complaint  Patient presents with  . Seizures    Patient was at a store, yelled , and was found on the floor "shaking", urinary incontinence noted, postictal upon EMS arrival, currently a/o x4.  Patient does states history of seizures but was taken off meds 5 years ago.    Wesley Mckay is a 39 y.o. male.  He has a remote history of seizures with the last one being 13 years ago.  He said he has been off seizure medicines for 5 years.  Per EMS he was at a store and was found shaking on the floor incontinent.  EMS found him postictal.  Currently the patient denies any complaints other than some right shoulder pain.  Does not recall where he was.  He said he has been compliant with his regular medications and drinks alcohol infrequently.  The history is provided by the patient and the EMS personnel.  Seizures Seizure activity on arrival: no   Seizure type:  Grand mal Initial focality:  Unable to specify Episode characteristics: generalized shaking and tongue biting   Postictal symptoms: confusion   Return to baseline: yes   Severity:  Unable to specify Timing:  Once Number of seizures this episode:  1 Progression:  Improving Context: not alcohol withdrawal   Recent head injury:  No recent head injuries PTA treatment:  None History of seizures: yes        Past Medical History:  Diagnosis Date  . GERD (gastroesophageal reflux disease)   . History of dislocation of shoulder   . Insomnia   . Obesity   . Seizures ([place]) 03/2007  . Thyroid disease    hypothyroidism    Patient Active Problem List   Diagnosis Date Noted  . OSA (obstructive sleep apnea) 02/22/2019  . 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 Elevated blood pressure reading 11/02/2018  . Non-restorative sleep 10/30/2018  . Snoring 10/30/2018  . Class 1 obesity due to excess calories  without serious comorbidity with body mass index (BMI) of 34.0 to 34.9 in adult 10/30/2018  . Muscle spasm 07/14/2017  . Neck pain 07/14/2017  . Attention deficit hyperactivity disorder (ADHD), combined type 07/14/2017  . Anxiety 11/17/2016  . Vitamin D deficiency 04/11/2016  . Seizure disorder ([place]) 02/29/2016  . Erectile dysfunction 02/29/2016  . B12 deficiency 09/15/2015  . Sinus bradycardia 09/15/2015  . Attention deficit hyperactivity disorder (ADHD), predominantly inattentive type 12/19/2014  . Major depressive disorder, recurrent episode, moderate ([place]) 12/19/2014  . Hyperlipidemia LDL goal <130 11/18/2014  . Inattention 11/17/2014  . No energy 11/17/2014  . Bilateral plantar fasciitis 11/14/2014  . 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 Nonspecific findings on examination of blood 09/18/2012  . Sleeping difficulty 09/18/2012  . Bilateral foot pain 08/16/2012  . 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 Anxiety disorder 10/04/2010  . Hypothyroidism 08/01/2007  . GERD 08/01/2007  . SEIZURE DISORDER 08/01/2007  . Primary insomnia 08/01/2007    Past Surgical History:  Procedure Laterality Date  . SHOULDER SURGERY     Dr. Angelene Giovanni       Family History  Problem Relation Age of Onset  . Thyroid disease Maternal Grandmother   . Diabetes Father   . Heart attack Father   . Healthy Mother     Social History   Tobacco Use  . Smoking status: Current Every Day Smoker  Packs/day: 1.00    Years: 15.00    Pack years: 15.00    Types: Cigarettes  . Smokeless tobacco: Never Used  Vaping Use  . Vaping Use: Never used  Substance Use Topics  . Alcohol use: Yes    Alcohol/week: 2.0 standard drinks    Types: 2 Standard drinks or equivalent per week  . Drug use: No    Home Medications Prior to Admission medications   Medication Sig Start Date End Date Taking? Authorizing Provider  AMBULATORY NON FORMULARY MEDICATION 3 ml syringe use every 2 weeks for testosterone injection   #100  22 1 1/2 gauge needle use every 2 weeks for testosterone injection  #100 07/22/16   Breeback, Jade L, PA-C  amoxicillin-clavulanate (AUGMENTIN) 875-125 MG tablet Take 1 tablet by mouth 2 (two) times daily. 07/24/19   Breeback, Luvenia Starch L, PA-C  celecoxib (CELEBREX) 200 MG capsule One to 2 tablets by mouth daily as needed for pain. 07/24/19   Breeback, Jade L, PA-C  clonazePAM (KLONOPIN) 0.5 MG tablet Take 1 tablet (0.5 mg total) by mouth at bedtime as needed. 07/24/19   Breeback, Jade L, PA-C  cyclobenzaprine (FLEXERIL) 10 MG tablet TAKE 1 TABLET(10 MG) BY MOUTH THREE TIMES DAILY AS NEEDED FOR MUSCLE SPASMS 07/24/19   Breeback, Royetta Car, PA-C  levothyroxine (SYNTHROID) 150 MCG tablet Take 2 tablets (300 mcg total) by mouth daily before breakfast. 07/24/19   Breeback, Jade L, PA-C  lisdexamfetamine (VYVANSE) 60 MG capsule Take 1 capsule (60 mg total) by mouth every morning. 09/23/19   Breeback, Jade L, PA-C  lisdexamfetamine (VYVANSE) 60 MG capsule Take 1 capsule (60 mg total) by mouth every morning. 08/23/19   Breeback, Jade L, PA-C  lisdexamfetamine (VYVANSE) 60 MG capsule Take 1 capsule (60 mg total) by mouth every morning. 07/24/19   Breeback, Royetta Car, PA-C  omeprazole (PRILOSEC) 40 MG capsule Take 1 capsule (40 mg total) by mouth 2 (two) times daily. 01/22/19   Breeback, Jade L, PA-C  sildenafil (REVATIO) 20 MG tablet TAKE 1 TO 5 TABLETS BY MOUTH AS NEEDED AS DIRECTED 08/09/19   Breeback, Jade L, PA-C  testosterone cypionate (DEPOTESTOSTERONE CYPIONATE) 200 MG/ML injection ADMINISTER 1.5 ML(300 MG) IN THE MUSCLE EVERY 14 DAYS 08/02/19   Breeback, Jade L, PA-C  zolpidem (AMBIEN) 10 MG tablet TAKE 1 TABLET(10 MG) BY MOUTH AT BEDTIME AS NEEDED FOR SLEEP 07/24/19   Breeback, Jade L, PA-C    Allergies    Sulfa antibiotics  Review of Systems   Review of Systems  Constitutional: Negative for fever.  HENT: Negative for sore throat.   Eyes: Negative for visual disturbance.  Respiratory: Negative for shortness of breath.    Cardiovascular: Negative for chest pain.  Gastrointestinal: Negative for abdominal pain.  Genitourinary: Negative for dysuria.  Musculoskeletal: Negative for neck pain.  Skin: Negative for rash.  Neurological: Positive for seizures.    Physical Exam Updated Vital Signs BP 133/84 (BP Location: Right Arm)   Pulse 93   Temp 98.3 F (36.8 C) (Oral)   Resp (!) 23   Ht 6\' 3"  (1.905 m)   Wt 108.9 kg   SpO2 96%   BMI 30.00 kg/m   Physical Exam Vitals and nursing note reviewed.  Constitutional:      Appearance: Normal appearance. He is well-developed.  HENT:     Head: Normocephalic and atraumatic.     Mouth/Throat:     Comments: Does have some bruising on the lateral edge of his tongue Eyes:  Conjunctiva/sclera: Conjunctivae normal.  Cardiovascular:     Rate and Rhythm: Normal rate and regular rhythm.     Heart sounds: No murmur heard.   Pulmonary:     Effort: Pulmonary effort is normal. No respiratory distress.     Breath sounds: Normal breath sounds.  Abdominal:     Palpations: Abdomen is soft.     Tenderness: There is no abdominal tenderness.  Musculoskeletal:        General: No deformity. Normal range of motion.     Cervical back: Neck supple.     Comments: Some pain with movement of right shoulder.  Appears to have normal landmarks.  Due to seizure will check x-ray.  Well-healed surgical scar left shoulder.  Skin:    General: Skin is warm and dry.     Capillary Refill: Capillary refill takes less than 2 seconds.  Neurological:     General: No focal deficit present.     Mental Status: He is alert and oriented to person, place, and time.     Sensory: No sensory deficit.     Motor: No weakness.     ED Results / Procedures / Treatments   Labs (all labs ordered are listed, but only abnormal results are displayed) Labs Reviewed  BASIC METABOLIC PANEL - Abnormal; Notable for the following components:      Result Value   CO2 21 (*)    Glucose, Bld 107 (*)     Creatinine, Ser 1.31 (*)    All other components within normal limits  CBC WITH DIFFERENTIAL/PLATELET - Abnormal; Notable for the following components:   WBC 11.1 (*)    Neutro Abs 8.7 (*)    Abs Immature Granulocytes 0.14 (*)    All other components within normal limits  MAGNESIUM - Abnormal; Notable for the following components:   Magnesium 2.6 (*)    All other components within normal limits    EKG None  Radiology DG Shoulder Right  Result Date: 12/09/2019 CLINICAL DATA:  Status post seizure. EXAM: RIGHT SHOULDER - 2+ VIEW COMPARISON:  None. FINDINGS: There is no evidence of an acute fracture or dislocation. There is no evidence of arthropathy or other focal bone abnormality. Soft tissues are unremarkable. IMPRESSION: Negative. Electronically Signed   By: Virgina Norfolk M.D.   On: 12/09/2019 23:12   CT HEAD WO CONTRAST  Result Date: 12/09/2019 CLINICAL DATA:  Seizure. EXAM: CT HEAD WITHOUT CONTRAST TECHNIQUE: Contiguous axial images were obtained from the base of the skull through the vertex without intravenous contrast. COMPARISON:  None. FINDINGS: Brain: No evidence of acute infarction, hemorrhage, hydrocephalus, extra-axial collection or mass lesion/mass effect. Vascular: No hyperdense vessel or unexpected calcification. Skull: Normal. Negative for fracture or focal lesion. Sinuses/Orbits: No acute finding. Other: None. IMPRESSION: No acute intracranial pathology. Electronically Signed   By: Virgina Norfolk M.D.   On: 12/09/2019 23:02    Procedures Procedures (including critical care time)  Medications Ordered in ED Medications  levETIRAcetam (KEPPRA) IVPB 1000 mg/100 mL premix (has no administration in time range)    ED Course  I have reviewed the triage vital signs and the nursing notes.  Pertinent labs & imaging results that were available during my care of the patient were reviewed by me and considered in my medical decision making (see chart for  details).  Clinical Course as of Dec 10 1038  Mon Dec 09, 2019  2308 Head CT interpreted by me as no acute bleeds.  Right shoulder x-ray interpreted by  me as no fracture or dislocation.   [MB]  2332 I loaded the patient with Keppra.  He now tells me that he was on Keppra but it seemed to make him more angry.  I reviewed this with neurology Dr. Theda Sers at [facility] who said the patient could go out on zonisamide 300 nightly and follow-up with outpatient neurology.  Will review with patient.   [MB]  2339 Patient has a sulfa allergy in the computer showing that there is some cross-reactivity with zonisamide.  I reviewed this with the pharmacist and she said he is on other medications that have the sulfa moiety and thought he would be fine with this.  Will review with patient.   [MB]    Clinical Course User Index [MB] Hayden Rasmussen, MD   MDM Rules/Calculators/A&P                         This patient complains of witnessed seizure right shoulder pain confusion; this involves an extensive number of treatment Options and is a complaint that carries with it a high risk of complications and Morbidity. The differential includes seizure, shoulder dislocation, postictal state, metabolic derangements, stroke, bleed, withdrawal  I ordered, reviewed and interpreted labs, which included CBC with mildly elevated white count likely reactive, normal hemoglobin, chemistries with low bicarb elevated creatinine I ordered medication IV Keppra load I ordered imaging studies which included CT head and right shoulder x-ray and I independently    visualized and interpreted imaging which showed no acute findings Additional history obtained from patient's wife, has not had a seizure in over 10 years Previous records obtained and reviewed in epic, do see that he was on Keppra back in 2013 I consulted neurology Dr. Theda Sers and discussed lab and imaging findings  Critical Interventions: None  After the interventions  stated above, I reevaluated the patient and found patient's mental status to be improved but not back to baseline.  His wife is here with him and is comfortable taking him home as she seen him like this before.  Counseled him that he cannot drive until he follows up with neurology and gets further recommendations from them.  Return instructions discussed.   Final Clinical Impression(s) / ED Diagnoses Final diagnoses:  Seizure ([place])    Rx / DC Orders ED Discharge Orders         Ordered    zonisamide (ZONEGRAN) 100 MG capsule  Daily at bedtime        12/09/19 2339    Ambulatory referral to Neurology       Comments: An appointment is requested in approximately: 2 weeks   12/09/19 2341           Hayden Rasmussen, MD 12/10/19 1043

## 2019-12-09 NOTE — ED Notes (Signed)
Patient transported to CT 

## 2019-12-10 ENCOUNTER — Encounter: Payer: Self-pay | Admitting: Physician Assistant

## 2019-12-12 ENCOUNTER — Encounter: Payer: Self-pay | Admitting: Neurology

## 2019-12-12 ENCOUNTER — Other Ambulatory Visit: Payer: Self-pay

## 2019-12-12 ENCOUNTER — Telehealth: Payer: Self-pay | Admitting: Neurology

## 2019-12-12 ENCOUNTER — Ambulatory Visit (INDEPENDENT_AMBULATORY_CARE_PROVIDER_SITE_OTHER): Payer: 59 | Admitting: Neurology

## 2019-12-12 VITALS — BP 132/86 | HR 76 | Ht 75.0 in | Wt 260.0 lb

## 2019-12-12 DIAGNOSIS — G40909 Epilepsy, unspecified, not intractable, without status epilepticus: Secondary | ICD-10-CM | POA: Diagnosis not present

## 2019-12-12 MED ORDER — LAMOTRIGINE 25 MG PO TABS: ORAL_TABLET | ORAL | 3 refills | Status: DC

## 2019-12-12 NOTE — Patient Instructions (Signed)
Seizure, Adult °A seizure is a sudden burst of abnormal electrical activity in the brain. Seizures usually last from 30 seconds to 2 minutes. They can cause many different symptoms. °Usually, seizures are not harmful unless they last a long time. °What are the causes? °Common causes of this condition include: °· Fever or infection. °· Conditions that affect the brain, such as: °? A brain abnormality that you were born with. °? A brain or head injury. °? Bleeding in the brain. °? A tumor. °? Stroke. °? Brain disorders such as autism or cerebral palsy. °· Low blood sugar. °· Conditions that are passed from parent to child (are inherited). °· Problems with substances, such as: °? Having a reaction to a drug or a medicine. °? Suddenly stopping the use of a substance (withdrawal). °In some cases, the cause may not be known. A person who has repeated seizures over time without a clear cause has a condition called epilepsy. °What increases the risk? °You are more likely to get this condition if you have: °· A family history of epilepsy. °· Had a seizure in the past. °· A brain disorder. °· A history of head injury, lack of oxygen at birth, or strokes. °What are the signs or symptoms? °There are many types of seizures. The symptoms vary depending on the type of seizure you have. Examples of symptoms during a seizure include: °· Shaking (convulsions). °· Stiffness in the body. °· Passing out (losing consciousness). °· Head nodding. °· Staring. °· Not responding to sound or touch. °· Loss of bladder control and bowel control. °Some people have symptoms right before and right after a seizure happens. °Symptoms before a seizure may include: °· Fear. °· Worry (anxiety). °· Feeling like you may vomit (nauseous). °· Feeling like the room is spinning (vertigo). °· Feeling like you saw or heard something before (déjà vu). °· Odd tastes or smells. °· Changes in how you see. You may see flashing lights or spots. °Symptoms after a  seizure happens can include: °· Confusion. °· Sleepiness. °· Headache. °· Weakness on one side of the body. °How is this treated? °Most seizures will stop on their own in under 5 minutes. In these cases, no treatment is needed. Seizures that last longer than 5 minutes will usually need treatment. Treatment can include: °· Medicines given through an IV tube. °· Avoiding things that are known to cause your seizures. These can include medicines that you take for another condition. °· Medicines to treat epilepsy. °· Surgery to stop the seizures. This may be needed if medicines do not help. °Follow these instructions at home: °Medicines °· Take over-the-counter and prescription medicines only as told by your doctor. °· Do not eat or drink anything that may keep your medicine from working, such as alcohol. °Activity °· Do not do any activities that would be dangerous if you had another seizure, like driving or swimming. Wait until your doctor says it is safe for you to do them. °· If you live in the [place] ask your local DMV (department of motor vehicles) when you can drive. °· Get plenty of rest. °Teaching others °Teach friends and family what to do when you have a seizure. They should: °· Lay you on the ground. °· Protect your head and body. °· Loosen any tight clothing around your neck. °· Turn you on your side. °· Not hold you down. °· Not put anything into your mouth. °· Know whether or not you need emergency care. °· Stay   with you until you are better. ° °General instructions °· Contact your doctor each time you have a seizure. °· Avoid anything that gives you seizures. °· Keep a seizure diary. Write down: °? What you think caused each seizure. °? What you remember about each seizure. °· Keep all follow-up visits as told by your doctor. This is important. °Contact a doctor if: °· You have another seizure. °· You have seizures more often. °· There is any change in what happens during your seizures. °· You keep having  seizures with treatment. °· You have symptoms of being sick or having an infection. °Get help right away if: °· You have a seizure that: °? Lasts longer than 5 minutes. °? Is different than seizures you had before. °? Makes it harder to breathe. °? Happens after you hurt your head. °· You have any of these symptoms after a seizure: °? Not being able to speak. °? Not being able to use a part of your body. °? Confusion. °? A bad headache. °· You have two or more seizures in a row. °· You do not wake up right after a seizure. °· You get hurt during a seizure. °These symptoms may be an emergency. Do not wait to see if the symptoms will go away. Get medical help right away. Call your local emergency services (911 in the [place] Do not drive yourself to the hospital. °Summary °· Seizures usually last from 30 seconds to 2 minutes. Usually, they are not harmful unless they last a long time. °· Do not eat or drink anything that may keep your medicine from working, such as alcohol. °· Teach friends and family what to do when you have a seizure. °· Contact your doctor each time you have a seizure. °This information is not intended to replace advice given to you by your health care provider. Make sure you discuss any questions you have with your health care provider. °Document Revised: 04/20/2018 Document Reviewed: 04/20/2018 °Elsevier Patient Education © 2020 [employer] Inc. ° °

## 2019-12-12 NOTE — Progress Notes (Signed)
SLEEP MEDICINE CLINIC    Provider:  Larey Seat, MD  Primary Care Physician:  Lavada Mesi [address]  [place] [address]     Referring Provider: Hayden Rasmussen, Md [address] [place],  [place] [address]          Chief Complaint according to patient   Patient presents with:    . New Patient (Initial Visit)           HISTORY OF PRESENT ILLNESS:  Wesley Mckay is a 39  year old White or Caucasian male patient seen here as a referral on 12/12/2019 from [facility] ED after a seizure- break through. He is also on CPAP . Chief concern according to patient : Recent break through seizure- first seizure 13 years ago, in November. Second seizure July 2009. Dislocated shoulder , broke 4 teeth.    I have the pleasure of seeing Wesley Mckay today, a right -handed White or Caucasian male with a known seizure and  sleep disorder. He  has a past medical history of GERD (gastroesophageal reflux disease), History of dislocation of shoulder, Insomnia, Obesity, Seizures ([place]) (03/2007), and Thyroid disease.   The patient had the first sleep study in the year 6/ 2020 with a result of an AHI ( Apnea Hypopnea index). His primary physician ordered the CPAP and test- will not be followed here. 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 Nobody witnessed the whole spell, he was incontinent. Tongue bite, lip bite.  The emergency room records state that the patient had a generalized tonic-clonic seizure which we used to call grand mal.  Generalized shaking tongue bite he had a laceration of the lip, he was confused postictally.  He has been off seizure medicines for at least 5 years now until this breakthrough seizure occurred all that EMS could record was at he was at a store and was found shaking on the floor with incontinence and EMS found him postictal no longer  convulsing he reported some right shoulder pain he was confused as to where he was.   Tox screen was negative, past medical history positive for GERD, dislocation of the shoulder, insomnia, obesity, seizure disorder and thyroid disease hypothyroidism.  He also was diagnosed with obstructive sleep apnea and the diagnosis was entered into his epic record in January 2021.  He has a history of ADHD vitamin D deficiency, B12 deficiency, sinus bradycardia, anxiety disorder.  He is a current everyday smoker he may drink 2 standard drinks a week, medication list includes clonazepam, Flexeril, Vyvanse, omeprazole Ambien.  WBC was 11.1 k/uLand elevated creatinine , likely due to soft tissue injuries.     Social history:  Patient is working as a Midwife, in daytime hours,   and lives in a household with spouse and 2 children.  Pets are  Present. 2 dogs and one cat.  Tobacco use- daily smoker- cig 1ppd.  ETOH use rare- less than 2 / month-, Caffeine intake in form of Coffee( 4-12 cups daily) Soda( coke - 4-6 day) Tea ( /) or energy drinks.    Sleep habits are as follows: The patient's dinner time is between 5-7 PM. The patient goes to bed at 12 PM and continues to struggle to sleep, only sleeps for 4 hours, Ambien prescribed -  Dr Doreene Nest The preferred sleep position is sideways with the support of 1 pillow. TV is sometimes on.  Dreams are reportedly rare.  6.30  AM is the usual rise time. The patient wakes up spontaneously. He reports not feeling refreshed or restored in AM, with symptoms such as dry mouth , morning headaches every day , and residual fatigue.  Naps are taken infrequently, lasting 60 minutes and are less refreshing than nocturnal sleep.    Review of Systems: Out of a complete 14 system review, the patient complains of only the following symptoms, and all other reviewed systems are negative.:  Fatigue, sleepiness , snoring, fragmented sleep, Insomnia - chronic since age 60-    History of onset of insomnia.   mind is racing.  How likely are you to doze in the following situations: 0 = not likely, 1 = slight chance, 2 = moderate chance, 3 = high chance   Sitting and Reading? Watching Television? Sitting inactive in a public place (theater or meeting)? As a passenger in a car for an hour without a break? Lying down in the afternoon when circumstances permit? Sitting and talking to someone? Sitting quietly after lunch without alcohol? In a car, while stopped for a few minutes in traffic?   Total =  4/ 24 points   FSS endorsed at N?A / 63 points.   Social History   Socioeconomic History  . Marital status: Married    Spouse name: Not on file  . Number of children: Not on file  . Years of education: Not on file  . Highest education level: Not on file  Occupational History  . Not on file  Tobacco Use  . Smoking status: Current Every Day Smoker    Packs/day: 1.00    Years: 15.00    Pack years: 15.00    Types: Cigarettes  . Smokeless tobacco: Never Used  Vaping Use  . Vaping Use: Never used  Substance and Sexual Activity  . Alcohol use: Yes    Alcohol/week: 2.0 standard drinks    Types: 2 Standard drinks or equivalent per week  . Drug use: No  . Sexual activity: Yes  Other Topics Concern  . Not on file  Social History Narrative  . Not on file   Social Determinants of Health   Financial Resource Strain:   . Difficulty of Paying Living Expenses: Not on file  Food Insecurity:   . Worried About Charity fundraiser in the Last Year: Not on file  . Ran Out of Food in the Last Year: Not on file  Transportation Needs:   . Lack of Transportation (Medical): Not on file  . Lack of Transportation (Non-Medical): Not on file  Physical Activity:   . Days of Exercise per Week: Not on file  . Minutes of Exercise per Session: Not on file  Stress:   . Feeling of Stress : Not on file  Social Connections:   . Frequency of Communication with Friends and  Family: Not on file  . Frequency of Social Gatherings with Friends and Family: Not on file  . Attends Religious Services: Not on file  . Active Member of Clubs or Organizations: Not on file  . Attends Archivist Meetings: Not on file  . Marital Status: married.    Family History  Problem Relation Age of Onset  . Thyroid disease Maternal Grandmother   . Diabetes Father   . Heart attack Father   . Healthy Mother  Past Medical History:  Diagnosis Date  . GERD (gastroesophageal reflux disease)   . History of dislocation of shoulder   . Insomnia   . Obesity   . Seizures ([place]) 03/2007  . Thyroid disease    hypothyroidism    Past Surgical History:  Procedure Laterality Date  . SHOULDER SURGERY     Dr. Angelene Giovanni     Current Outpatient Medications on File Prior to Visit  Medication Sig Dispense Refill  . AMBULATORY NON FORMULARY MEDICATION 3 ml syringe use every 2 weeks for testosterone injection  #100  22 1 1/2 gauge needle use every 2 weeks for testosterone injection  #100 100 each 0  . celecoxib (CELEBREX) 200 MG capsule One to 2 tablets by mouth daily as needed for pain. 60 capsule 1  . clonazePAM (KLONOPIN) 0.5 MG tablet Take 1 tablet (0.5 mg total) by mouth at bedtime as needed. 30 tablet 5  . cyclobenzaprine (FLEXERIL) 10 MG tablet TAKE 1 TABLET(10 MG) BY MOUTH THREE TIMES DAILY AS NEEDED FOR MUSCLE SPASMS 30 tablet 2  . levothyroxine (SYNTHROID) 150 MCG tablet Take 2 tablets (300 mcg total) by mouth daily before breakfast. 60 tablet 5  . lisdexamfetamine (VYVANSE) 60 MG capsule Take 1 capsule (60 mg total) by mouth every morning. 30 capsule 0  . lisdexamfetamine (VYVANSE) 60 MG capsule Take 1 capsule (60 mg total) by mouth every morning. 30 capsule 0  . lisdexamfetamine (VYVANSE) 60 MG capsule Take 1 capsule (60 mg total) by mouth every morning. 30 capsule 0  . omeprazole (PRILOSEC) 40 MG capsule Take 1 capsule (40 mg total) by mouth 2 (two) times daily. 180  capsule 3  . sildenafil (REVATIO) 20 MG tablet TAKE 1 TO 5 TABLETS BY MOUTH AS NEEDED AS DIRECTED 30 tablet 5  . testosterone cypionate (DEPOTESTOSTERONE CYPIONATE) 200 MG/ML injection ADMINISTER 1.5 ML(300 MG) IN THE MUSCLE EVERY 14 DAYS 4 mL 5  . zolpidem (AMBIEN) 10 MG tablet TAKE 1 TABLET(10 MG) BY MOUTH AT BEDTIME AS NEEDED FOR SLEEP 30 tablet 5  . amoxicillin-clavulanate (AUGMENTIN) 875-125 MG tablet Take 1 tablet by mouth 2 (two) times daily. (Patient not taking: Reported on 12/12/2019) 20 tablet 0  . zonisamide (ZONEGRAN) 100 MG capsule Take 3 capsules (300 mg total) by mouth at bedtime. (Patient not taking: Reported on 12/12/2019) 90 capsule 0   No current facility-administered medications on file prior to visit.    Allergies  Allergen Reactions  . Sulfa Antibiotics     Itching and rash    Physical exam:  Today's Vitals   12/12/19 0904  BP: 132/86  Pulse: 76  Weight: 260 lb (117.9 kg)  Height: 6\' 3"  (1.905 m)   Body mass index is 32.5 kg/m.   Wt Readings from Last 3 Encounters:  12/12/19 260 lb (117.9 kg)  12/09/19 240 lb (108.9 kg)  07/24/19 251 lb (113.9 kg)     Ht Readings from Last 3 Encounters:  12/12/19 6\' 3"  (1.905 m)  12/09/19 6\' 3"  (1.905 m)  07/24/19 6\' 3"  (1.905 m)    Not vaccinated for Covid 19-   General: The patient is awake, alert and appears not in acute distress. The patient has facial hair.  Head: Normocephalic, atraumatic. Neck is supple. Mallampati 3 plus ,  neck circumference: 18 inches . Nasal airflow patent.   Retrognathia is not seen.  Dental status:  Cardiovascular:  Regular rate and cardiac rhythm by pulse,  without distended neck veins. Respiratory: Lungs are clear to auscultation.  Skin:  Without evidence of ankle edema, or rash. Trunk: The patient's posture is erect.   Neurologic exam : The patient is awake and alert, oriented to place and time.   Memory subjective described as intact.  Attention span & concentration ability  appears normal.  Speech is fluent,  without  dysarthria, dysphonia or aphasia.  Mood and affect are appropriate.   Cranial nerves: no loss of smell or taste reported  Pupils are equal and briskly reactive to light. Funduscopic exam deferred.  Extraocular movements in vertical and horizontal planes were intact and without nystagmus. No Diplopia. Visual fields by finger perimetry are intact. Hearing was intact to soft voice and finger rubbing.    Facial sensation intact to fine touch.  Facial motor strength is symmetric and tongue and uvula move midline.  Neck ROM : rotation, tilt and flexion extension were normal for age and shoulder shrug was symmetrical.    Motor exam:  Symmetric bulk, tone and ROM.   Normal tone without cog wheeling, symmetric grip strength .  Sensory:  Fine touch, pinprick and vibration were tested  and  normal.  Proprioception tested in the upper extremities was normal. Coordination: Rapid alternating movements in the fingers/hands were of normal speed.  The Finger-to-nose maneuver was intact without evidence of ataxia, dysmetria or tremor.   Gait and station: Patient could rise unassisted from a seated position, walked without assistive device.  Toe and heel walk were deferred.  Deep tendon reflexes: in the  upper and lower extremities are symmetric and intact.  Babinski response was deferred.l        After spending a total time of  45 minutes face to face and additional time for physical and neurologic examination, review of laboratory studies,  personal review of imaging studies, reports and results of other testing and review of referral information / records as far as provided in visit, I have established the following assessments:  1) break through seizure while having a tooth infection and being on ATB- had a known GTC-seizure disorder, Generalized tonic clonic- bad reaction to Keppra, ZONisamide was prescribed but has sulfa in it. He reportedly is allergic  to Sulfa. CT negative.  I will order MRI brain and EEG -  I recommend Lamictal - start 25 mg bid for week one, 50 mg bid week two, 75 mg bid week 3.   2) chronic insomnia, failed  melatonin, valerian root, trazodone , and now takes Ambien. SLEEP is treated by his PCP who initiated testing and CPAP treatment for OSA.     I would like to thank Lavada Mesi and Hayden Rasmussen, Md [address] [place],  [place] [address] for allowing me to meet with and to take care of this pleasant patient.   In short, Wesley Mckay is presenting with break through seizure. I plan to follow up either personally or through our NP within 3 month.   CC: I will share my notes with PCP.   [place] law forbids driving and machine operation for 6 month after a seizure. Patient and wife have agreed , acknowledged these restriction.  Electronically signed by: Larey Seat, MD 12/12/2019 9:20 AM  [facility] Board certified by The [employer] of Sleep Medicine and Diplomate of the [employer] of Sleep Medicine. Board certified In Neurology through the [place], Fellow of the [employer] of Neurology. Medical Director of [facility].

## 2019-12-12 NOTE — Telephone Encounter (Signed)
unable to leave vmail the mail box is full  [facility] auth: [id number] (exp. 12/12/19 to 01/26/20)

## 2019-12-17 NOTE — Telephone Encounter (Signed)
x2 unable to get a hold of the patient his mail box is full.

## 2019-12-19 ENCOUNTER — Ambulatory Visit (INDEPENDENT_AMBULATORY_CARE_PROVIDER_SITE_OTHER): Payer: 59

## 2019-12-19 ENCOUNTER — Other Ambulatory Visit: Payer: Self-pay

## 2019-12-19 DIAGNOSIS — G40909 Epilepsy, unspecified, not intractable, without status epilepticus: Secondary | ICD-10-CM

## 2019-12-23 NOTE — Telephone Encounter (Signed)
Wesley Mckay - wife - [phone] Best contact

## 2019-12-23 NOTE — Telephone Encounter (Signed)
Can you please contact this patient for MRI brain with and without contrast. DX seizure,  Abnormal EEG.

## 2019-12-23 NOTE — Telephone Encounter (Signed)
If you have his wife Joellen Jersey on file she is the best contact person.

## 2019-12-23 NOTE — Procedures (Signed)
[facility]  EEG (ELECTROENCEPHALOGRAM) REPORT     ORDERING CLINICIAN: Larey Seat, M.D.  TECHNOLOGIST: Milana Na, RPSGT, REEGT TECHNIQUE:  This Electroencephalogram was recorded utilizing the international standard 10-20 system of lead placement and reformatted into average and bipolar montages.  A single ECG electrode is placed to detect heart rate and rhythm. While a camera is directed at the patient during EEG , and a video screen is parallel to the EEG observed by the attending technologist, the video and audio can not be recorded.   STUDY DATE:   PATIENT NAME:  Recoding duration : 30 min 08 sec.  Activation included:  Photic stimulation and Hyperventilation.      Description: The EEG's posterior dominant background rhythm of 11 hertz was symmetrically displayed while the patient's eyes were closed , and promptly attenuated with eye opening. At baseline, the recording showed a moderate amplitude in symmetric fashion.  Hyperventilation maneuver was initiated, leading to amplitude build-up, but not leading to slowing.  Sharp wave d/c with phase reversal over F4 were noted, these appeared periodic and limited to single spikes.  Following hyperventilation, the patient's EEG was reviewed for a period of 1 and 2 minutes post maneuver, and indicated bifrontal slowing, drowsiness.    Photic stimulation was initiated at frequencies from 3- through 20 hertz, resulting in photic entrainment at any rhythm from 6 hertz and above without periodic or rhythmic discharges, or epileptiform activity. Post photic maneuver , the patient was able to rest and showed again bifrontal slowing, now isolated vertex activity, indicating drowsiness again.    The patient was recorded while awake,drowsy .   IMPRESSION:  This EEG is abnormal due to the findings of F 4 sharp wave discharges, with phase reversal over F 4. Medication treatment with Lamictal ( as discussed in visit)  was recommended. MRI brain was ordered.      Dr. Larey Seat, M.D. Accredited by the [employer], [place].

## 2019-12-23 NOTE — Progress Notes (Signed)
Single spike activity arising over F 4 . Mild dysrhythmia- abnormal EEG. Medication treatment is recommended

## 2019-12-24 ENCOUNTER — Telehealth: Payer: Self-pay | Admitting: Neurology

## 2019-12-24 ENCOUNTER — Encounter: Payer: Self-pay | Admitting: Neurology

## 2019-12-24 NOTE — Telephone Encounter (Signed)
Called the patient to review the EEG results, there was no answer and mailbox was full unable to leave a message. I will send a mychart message to the patient.

## 2019-12-24 NOTE — Telephone Encounter (Signed)
-----   Message from Larey Seat, MD sent at 12/23/2019  4:49 PM EST ----- Single spike activity arising over F 4 . Mild dysrhythmia- abnormal EEG. Medication treatment is recommended

## 2019-12-24 NOTE — Telephone Encounter (Signed)
Noted, I left a voicemail on Katie's phone to give me a call back to schedule Wesley Mckay MRI.

## 2019-12-25 NOTE — Telephone Encounter (Signed)
Katie returned my call he is scheduled at [facility] for 01/08/20.

## 2020-01-06 ENCOUNTER — Other Ambulatory Visit: Payer: Self-pay | Admitting: Neurology

## 2020-01-06 DIAGNOSIS — M62838 Other muscle spasm: Secondary | ICD-10-CM

## 2020-01-06 DIAGNOSIS — M542 Cervicalgia: Secondary | ICD-10-CM

## 2020-01-06 MED ORDER — CYCLOBENZAPRINE HCL 10 MG PO TABS: ORAL_TABLET | ORAL | 2 refills | Status: DC

## 2020-01-06 NOTE — Progress Notes (Signed)
B12 request denied and faxed to pharmacy.

## 2020-01-08 ENCOUNTER — Other Ambulatory Visit: Payer: 59

## 2020-01-20 ENCOUNTER — Telehealth (INDEPENDENT_AMBULATORY_CARE_PROVIDER_SITE_OTHER): Payer: 59 | Admitting: Physician Assistant

## 2020-01-20 ENCOUNTER — Encounter: Payer: Self-pay | Admitting: Physician Assistant

## 2020-01-20 VITALS — Ht 75.0 in | Wt 255.0 lb

## 2020-01-20 DIAGNOSIS — Z79899 Other long term (current) drug therapy: Secondary | ICD-10-CM

## 2020-01-20 DIAGNOSIS — F902 Attention-deficit hyperactivity disorder, combined type: Secondary | ICD-10-CM | POA: Diagnosis not present

## 2020-01-20 DIAGNOSIS — E291 Testicular hypofunction: Secondary | ICD-10-CM

## 2020-01-20 DIAGNOSIS — F5101 Primary insomnia: Secondary | ICD-10-CM

## 2020-01-20 DIAGNOSIS — Z131 Encounter for screening for diabetes mellitus: Secondary | ICD-10-CM

## 2020-01-20 DIAGNOSIS — E538 Deficiency of other specified B group vitamins: Secondary | ICD-10-CM

## 2020-01-20 DIAGNOSIS — F331 Major depressive disorder, recurrent, moderate: Secondary | ICD-10-CM

## 2020-01-20 DIAGNOSIS — E559 Vitamin D deficiency, unspecified: Secondary | ICD-10-CM

## 2020-01-20 DIAGNOSIS — G40909 Epilepsy, unspecified, not intractable, without status epilepticus: Secondary | ICD-10-CM

## 2020-01-20 DIAGNOSIS — K219 Gastro-esophageal reflux disease without esophagitis: Secondary | ICD-10-CM

## 2020-01-20 DIAGNOSIS — F419 Anxiety disorder, unspecified: Secondary | ICD-10-CM | POA: Diagnosis not present

## 2020-01-20 DIAGNOSIS — E039 Hypothyroidism, unspecified: Secondary | ICD-10-CM | POA: Diagnosis not present

## 2020-01-20 DIAGNOSIS — Z13220 Encounter for screening for lipoid disorders: Secondary | ICD-10-CM

## 2020-01-20 DIAGNOSIS — E785 Hyperlipidemia, unspecified: Secondary | ICD-10-CM

## 2020-01-20 DIAGNOSIS — G4733 Obstructive sleep apnea (adult) (pediatric): Secondary | ICD-10-CM

## 2020-01-20 MED ORDER — LISDEXAMFETAMINE DIMESYLATE 60 MG PO CAPS: 60.0000 mg | ORAL_CAPSULE | ORAL | 0 refills | Status: DC

## 2020-01-20 MED ORDER — CYANOCOBALAMIN 1000 MCG/ML IJ SOLN: 1000.0000 ug | INTRAMUSCULAR | 11 refills | Status: DC

## 2020-01-20 MED ORDER — LEVOTHYROXINE SODIUM 150 MCG PO TABS: 300.0000 ug | ORAL_TABLET | Freq: Every day | ORAL | 5 refills | Status: DC

## 2020-01-20 MED ORDER — ZOLPIDEM TARTRATE 10 MG PO TABS: ORAL_TABLET | ORAL | 5 refills | Status: DC

## 2020-01-20 MED ORDER — CLONAZEPAM 0.5 MG PO TABS: 0.5000 mg | ORAL_TABLET | Freq: Every evening | ORAL | 5 refills | Status: DC | PRN

## 2020-01-20 MED ORDER — TESTOSTERONE CYPIONATE 200 MG/ML IM SOLN: INTRAMUSCULAR | 5 refills | Status: DC

## 2020-01-20 NOTE — Progress Notes (Signed)
Patient ID: Wesley Mckay, male   DOB: 1980-08-20, 39 y.o.   MRN: [id number] .Marland KitchenVirtual Visit via Telephone Note  I connected with Wesley Mckay on 01/22/20 at  2:00 PM EST by telephone and verified that I am speaking with the correct person using two identifiers.  Location: Patient: home Provider: clinic  .Marland KitchenParticipating in visit:  Patient: Wesley Mckay Provider: Iran Planas PA-C Provider in training: Ola Spurr PA-Student    I discussed the limitations, risks, security and privacy concerns of performing an evaluation and management service by telephone and the availability of in person appointments. I also discussed with the patient that there may be a patient responsible charge related to this service. The patient expressed understanding and agreed to proceed.   History of Present Illness: Pt is a 39 yo obese male with ADHD, hypothyroidism, hypogonadism, OSA, GERD, seizure disorder, MDD, anxiety, insomnia who calls into the clinic for medication refill.   Pt has not had another seizure since October. He is seeing neurology. Not aware of any trigger. He has had a lot of teeth pain and has very poor dentition and wonder if inflammation/infection from that caused a seizure to occur. He is trying to get work done on mouth but was quoted 100,000.   Needs refills of vyvanse. No problems or concerns. No increase in anxiety, insomnia, palpitations.   He did not have labs drawn from the order in June.   He questions switching from testosterone shots to cream/gel.   He needs b12 refill.   He continues to smoke.   .. Active Ambulatory Problems    Diagnosis Date Noted  . Hypothyroidism 08/01/2007  . GERD 08/01/2007  . SEIZURE DISORDER 08/01/2007  . Primary insomnia 08/01/2007  . Anxiety disorder 10/04/2010  . 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 Bilateral foot pain 08/16/2012  . Bilateral plantar fasciitis 11/14/2014  . Inattention 11/17/2014  . No energy 11/17/2014   . Hyperlipidemia LDL goal <130 11/18/2014  . Attention deficit hyperactivity disorder (ADHD), predominantly inattentive type 12/19/2014  . Major depressive disorder, recurrent episode, moderate ([place]) 12/19/2014  . B12 deficiency 09/15/2015  . Sinus bradycardia 09/15/2015  . Seizure disorder ([place]) 02/29/2016  . Erectile dysfunction 02/29/2016  . Vitamin D deficiency 04/11/2016  . Anxiety 11/17/2016  . Muscle spasm 07/14/2017  . Neck pain 07/14/2017  . Attention deficit hyperactivity disorder (ADHD), combined type 07/14/2017  . 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 Nonspecific findings on examination of blood 09/18/2012  . Sleeping difficulty 09/18/2012  . Non-restorative sleep 10/30/2018  . Snoring 10/30/2018  . Class 1 obesity due to excess calories without serious comorbidity with body mass index (BMI) of 34.0 to 34.9 in adult 10/30/2018  . 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 Elevated blood pressure reading 11/02/2018  . OSA (obstructive sleep apnea) 02/22/2019   Resolved Ambulatory Problems    Diagnosis Date Noted  . FOOT PAIN, RIGHT 11/29/2007   Past Medical History:  Diagnosis Date  . GERD (gastroesophageal reflux disease)   . History of dislocation of shoulder   . Insomnia   . Obesity   . Seizures ([place]) 03/2007  . Thyroid disease    Reviewed med, allergy, problem list.    Observations/Objective: No acute distress Normal mood.  Normal breathing.   .. Today's Vitals   01/20/20 1331  Weight: 255 lb (115.7 kg)  Height: 6\' 3"  (1.905 m)   Body mass index is 31.87 kg/m.  .. Depression screen [facility] 2/9 07/24/2019 02/22/2019 10/30/2018 07/30/2018 07/12/2017  Decreased  Interest 0 0 0 0 0  Down, Depressed, Hopeless 0 0 1 1 1   PHQ - 2 Score 0 0 1 1 1   Altered sleeping 2 - 3 3 3   Tired, decreased energy 0 - 1 1 1   Change in appetite 0 - 0 0 0  Feeling bad or failure about yourself  0 - 1 0 1  Trouble concentrating 0 - 0 0 1  Moving slowly or fidgety/restless 0 - 0 0 0   Suicidal thoughts 0 - 0 0 0  PHQ-9 Score 2 - 6 5 7   Difficult doing work/chores Somewhat difficult - Somewhat difficult Somewhat difficult Not difficult at all   .[name] GAD 7 : Generalized Anxiety Score 07/24/2019 02/22/2019 10/30/2018 07/30/2018  Nervous, Anxious, on Edge 0 0 1 1  Control/stop worrying 0 0 0 0  Worry too much - different things 0 0 1 1  Trouble relaxing 1 1 1  0  Restless 0 0 0 0  Easily annoyed or irritable 0 0 0 0  Afraid - awful might happen 0 0 0 0  Total GAD 7 Score 1 1 3 2   Anxiety Difficulty Not difficult at all Not difficult at all Not difficult at all Not difficult at all      Assessment and Plan: Marland KitchenMarland KitchenThurston was seen today for adhd.  Diagnoses and all orders for this visit:  Attention deficit hyperactivity disorder (ADHD), combined type -     lisdexamfetamine (VYVANSE) 60 MG capsule; Take 1 capsule (60 mg total) by mouth every morning. -     lisdexamfetamine (VYVANSE) 60 MG capsule; Take 1 capsule (60 mg total) by mouth every morning. -     lisdexamfetamine (VYVANSE) 60 MG capsule; Take 1 capsule (60 mg total) by mouth every morning.  Primary insomnia -     zolpidem (AMBIEN) 10 MG tablet; TAKE 1 TABLET(10 MG) BY MOUTH AT BEDTIME AS NEEDED FOR SLEEP  Hypothyroidism, unspecified type -     levothyroxine (SYNTHROID) 150 MCG tablet; Take 2 tablets (300 mcg total) by mouth daily before breakfast. -     TSH  Anxiety -     clonazePAM (KLONOPIN) 0.5 MG tablet; Take 1 tablet (0.5 mg total) by mouth at bedtime as needed.  Male hypogonadism -     testosterone cypionate (DEPOTESTOSTERONE CYPIONATE) 200 MG/ML injection; ADMINISTER 1.5 ML(300 MG) IN THE MUSCLE EVERY 14 DAYS -     COMPLETE METABOLIC PANEL WITH GFR -     CBC -     PSA -     Testosterone Total,Free,Bio, Males  B12 deficiency -     cyanocobalamin (,VITAMIN B-12,) 1000 MCG/ML injection; Inject 1 mL (1,000 mcg total) into the muscle every 30 (thirty) days. -     B12  Medication management -     TSH -      COMPLETE METABOLIC PANEL WITH GFR -     CBC -     PSA -     Lipid Panel w/reflex Direct LDL -     Testosterone Total,Free,Bio, Males -     B12  Screening for lipid disorders -     Lipid Panel w/reflex Direct LDL  Screening for diabetes mellitus -     COMPLETE METABOLIC PANEL WITH GFR  OSA (obstructive sleep apnea)  Gastroesophageal reflux disease, unspecified whether esophagitis present  Seizure disorder (HCC)  Hyperlipidemia LDL goal <130  Major depressive disorder, recurrent episode, moderate (HCC)  Vitamin D deficiency   Needs labs. Pt states he will  go in the next month.   Pt not interested in smoking cessation today.   Refilled vyvanse/testosterone/klonapin.   Continue to work with neurology on treatment plan and follow up after seizure.   Follow up in 3 months.     Follow Up Instructions:    I discussed the assessment and treatment plan with the patient. The patient was provided an opportunity to ask questions and all were answered. The patient agreed with the plan and demonstrated an understanding of the instructions.   The patient was advised to call back or seek an in-person evaluation if the symptoms worsen or if the condition fails to improve as anticipated.  I provided 25 minutes of non-face-to-face time during this encounter.   Iran Planas, PA-C

## 2020-01-20 NOTE — Progress Notes (Signed)
Wants to change testosterone injections to cream, wife is nurse and suggested this, no current issues with injections  He asked for refill of B12 injections, not on current med list, looks like last ordered in January 2021 for 6 month supply  Needs Vyvanse refills   Has not had labs done ordered in June

## 2020-01-28 ENCOUNTER — Other Ambulatory Visit: Payer: Self-pay | Admitting: Physician Assistant

## 2020-01-28 DIAGNOSIS — N529 Male erectile dysfunction, unspecified: Secondary | ICD-10-CM

## 2020-01-28 NOTE — Telephone Encounter (Signed)
Last written 08/09/2019 #30 with 5 refills Last appt 01/20/2020

## 2020-02-11 ENCOUNTER — Encounter: Payer: Self-pay | Admitting: Neurology

## 2020-02-11 ENCOUNTER — Encounter: Payer: Self-pay | Admitting: Physician Assistant

## 2020-02-12 ENCOUNTER — Telehealth: Payer: Self-pay | Admitting: Neurology

## 2020-02-12 ENCOUNTER — Other Ambulatory Visit: Payer: Self-pay | Admitting: Neurology

## 2020-02-12 MED ORDER — LAMOTRIGINE 100 MG PO TABS: 100.0000 mg | ORAL_TABLET | Freq: Two times a day (BID) | ORAL | 1 refills | Status: DC

## 2020-02-12 NOTE — Telephone Encounter (Signed)
Baird Lyons: Please see my chart message for reference.  Please call patient or caretaker on DPR to add 25 mg of Lamictal at night in addition to his 100 mg twice daily.  If he has any 25 mg strength tablets left, he can start adding 1 pill at bedtime, otherwise, please send in no separate prescription for to 25 mg strength to be taken 1 nightly.  Dr. Vickey Huger, please review upon your return for additional recommendations.

## 2020-02-12 NOTE — Telephone Encounter (Deleted)
Please see patient message.

## 2020-02-13 NOTE — Telephone Encounter (Signed)
I agree with these changes- CD

## 2020-03-12 ENCOUNTER — Encounter: Payer: Self-pay | Admitting: Physician Assistant

## 2020-03-12 DIAGNOSIS — D1809 Hemangioma of other sites: Secondary | ICD-10-CM

## 2020-03-12 DIAGNOSIS — G40909 Epilepsy, unspecified, not intractable, without status epilepticus: Secondary | ICD-10-CM

## 2020-03-12 DIAGNOSIS — R937 Abnormal findings on diagnostic imaging of other parts of musculoskeletal system: Secondary | ICD-10-CM

## 2020-03-12 DIAGNOSIS — S199XXD Unspecified injury of neck, subsequent encounter: Secondary | ICD-10-CM

## 2020-03-14 ENCOUNTER — Other Ambulatory Visit: Payer: Self-pay | Admitting: Physician Assistant

## 2020-03-14 DIAGNOSIS — Z1389 Encounter for screening for other disorder: Secondary | ICD-10-CM

## 2020-03-14 DIAGNOSIS — Z0189 Encounter for other specified special examinations: Secondary | ICD-10-CM

## 2020-03-15 ENCOUNTER — Ambulatory Visit (INDEPENDENT_AMBULATORY_CARE_PROVIDER_SITE_OTHER): Payer: 59

## 2020-03-15 ENCOUNTER — Other Ambulatory Visit: Payer: Self-pay

## 2020-03-15 DIAGNOSIS — Z1389 Encounter for screening for other disorder: Secondary | ICD-10-CM | POA: Diagnosis not present

## 2020-03-15 DIAGNOSIS — D1809 Hemangioma of other sites: Secondary | ICD-10-CM

## 2020-03-15 DIAGNOSIS — M50322 Other cervical disc degeneration at C5-C6 level: Secondary | ICD-10-CM | POA: Diagnosis not present

## 2020-03-15 DIAGNOSIS — M50223 Other cervical disc displacement at C6-C7 level: Secondary | ICD-10-CM

## 2020-03-15 DIAGNOSIS — M5031 Other cervical disc degeneration,  high cervical region: Secondary | ICD-10-CM

## 2020-03-15 DIAGNOSIS — M50321 Other cervical disc degeneration at C4-C5 level: Secondary | ICD-10-CM | POA: Diagnosis not present

## 2020-03-15 IMAGING — DX DG ORBITS FOR FOREIGN BODY
2 series · 2 of 2 positions shown · non-contrast
Comparison: None.

CLINICAL DATA: Metal working/exposure; clearance prior to MRI

EXAM:
ORBITS FOR FOREIGN BODY - 2 VIEW

[orbits waters (1 of 2)]
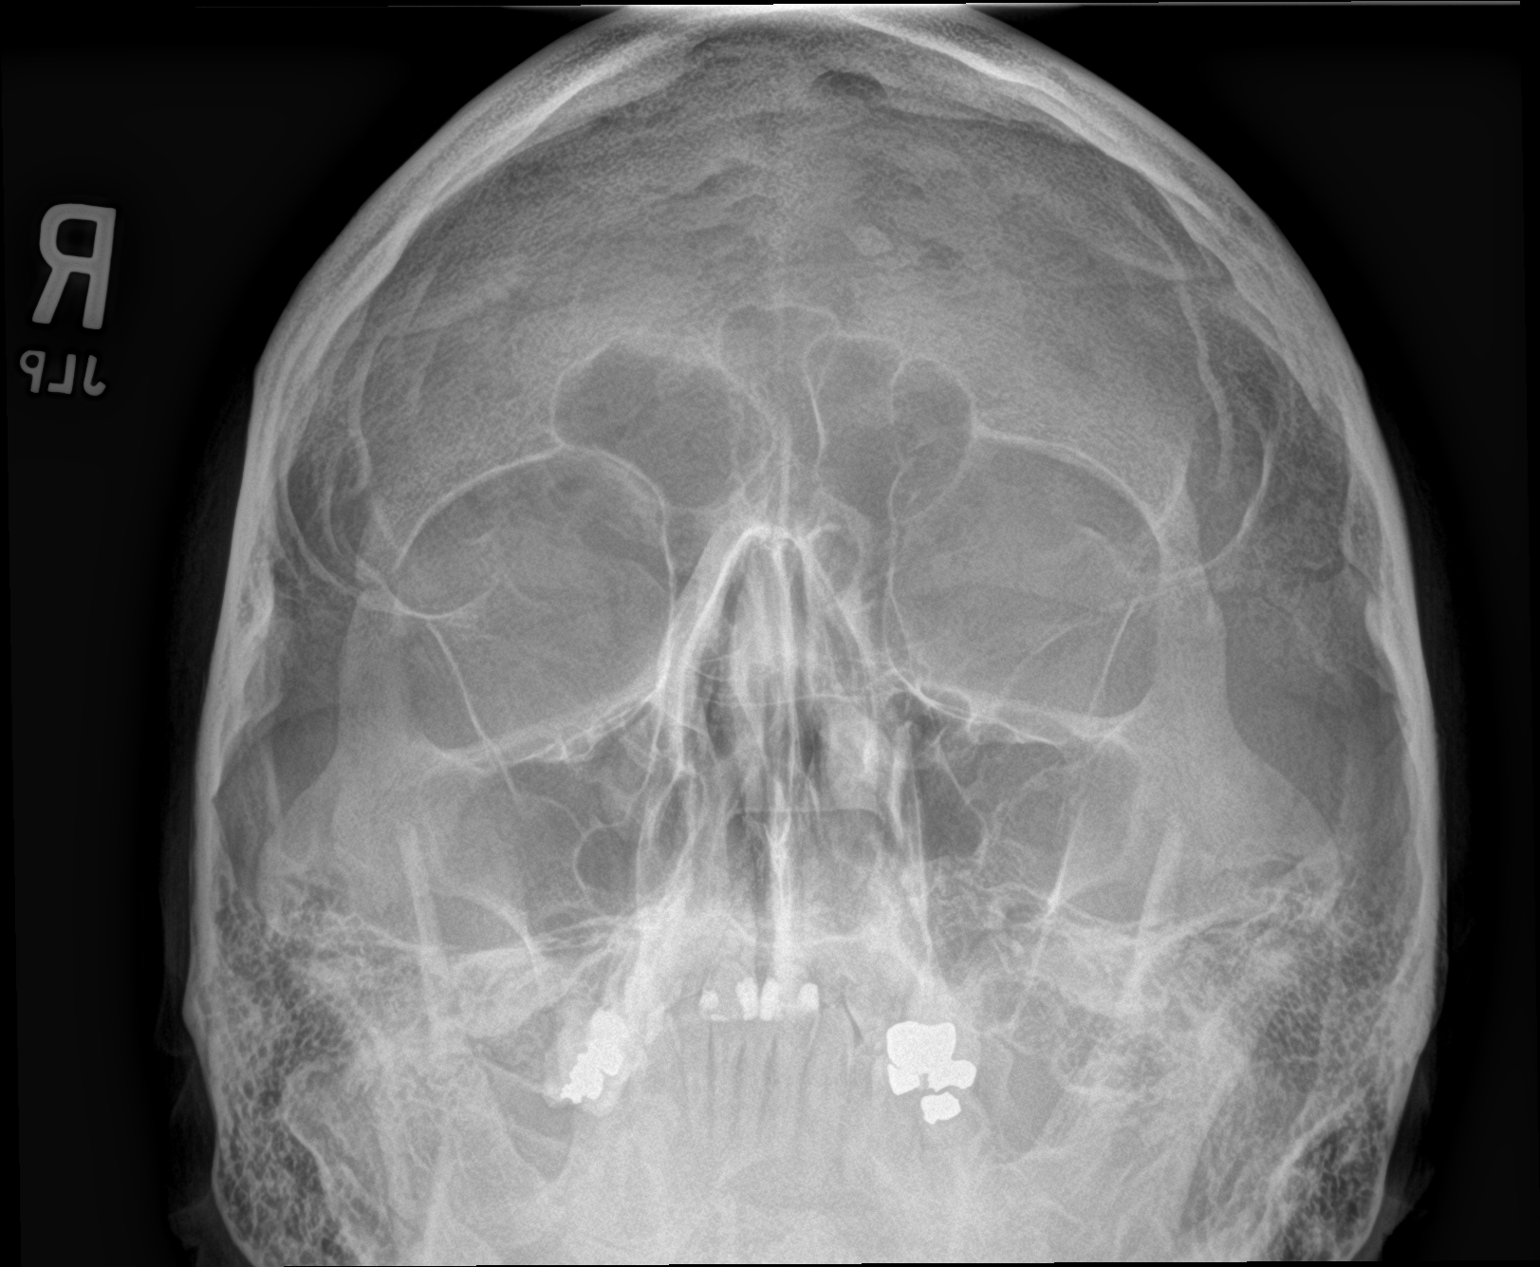

[orbits waters (2 of 2)]
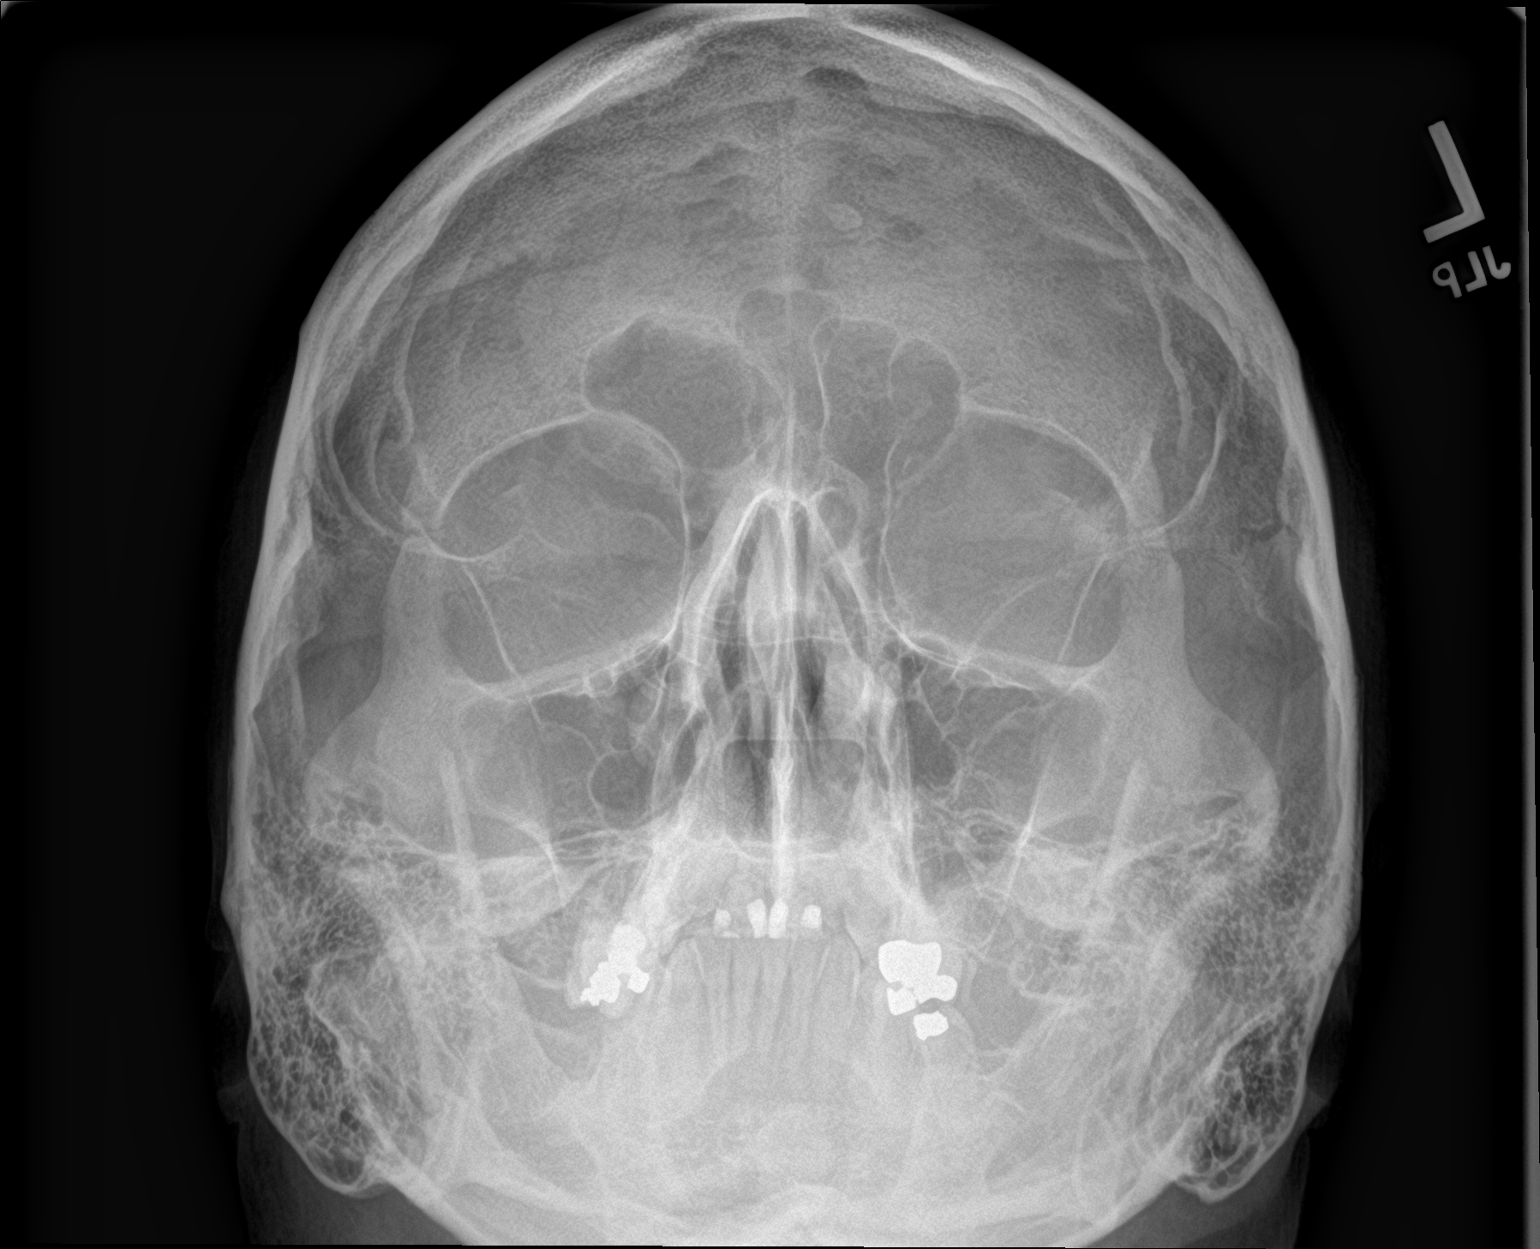

[2 of 2 positions shown; findings below may reference images not displayed]

FINDINGS: There is no evidence of metallic foreign body within the orbits. No
significant bone abnormality identified.
IMPRESSION: No evidence of metallic foreign body within the orbits.

## 2020-03-15 IMAGING — MR MR CERVICAL SPINE W/O CM
6 series · 41 of 48 positions shown · non-contrast
Comparison: Cervical radiographs [DATE]

CLINICAL DATA: Neck trauma. Outside CT detected lesion in C2
vertebral body.

EXAM:
MRI CERVICAL SPINE WITHOUT CONTRAST
TECHNIQUE: Multiplanar, multisequence MR imaging of the cervical spine was
performed. No intravenous contrast was administered.

[Series 2: T2 · sagittal · 3.0mm · 0.69mm/px · 5 of 13 slices shown (1 of 2)]
[im 1/13]
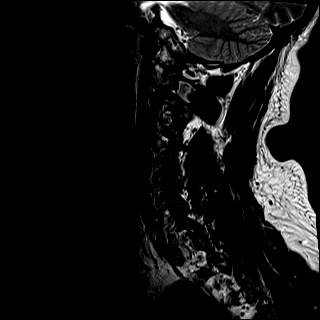
[im 4/13]
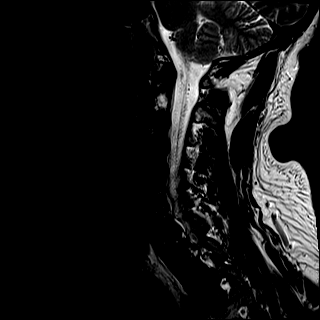
[im 7/13]
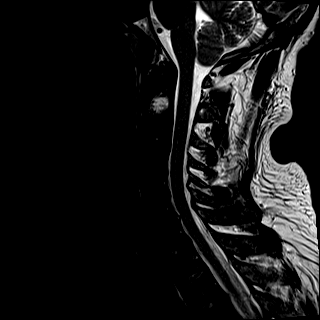
[im 10/13]
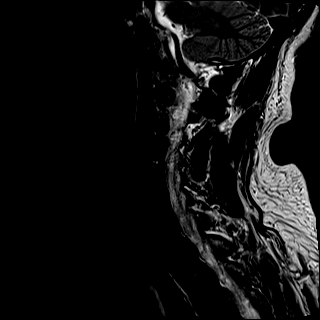
[im 13/13]
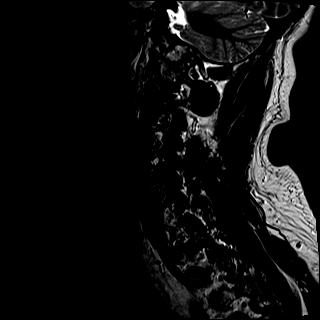

[Series 3: T1 · sagittal · 3.0mm · 0.86mm/px · 5 of 13 slices shown]
[im 1/13]
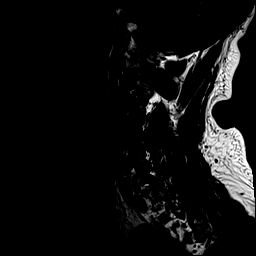
[im 4/13]
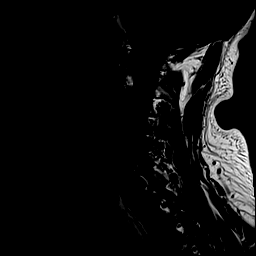
[im 7/13]
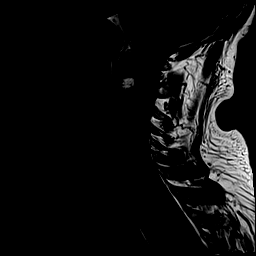
[im 10/13]
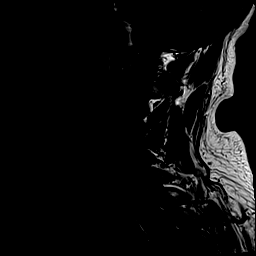
[im 13/13]
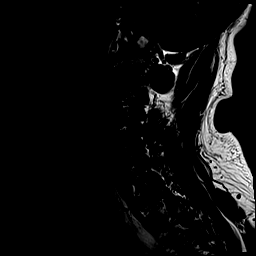

[Series 4: STIR · sagittal · 3.0mm · 0.69mm/px · 5 of 13 slices shown (1 of 2)]
[im 1/13]
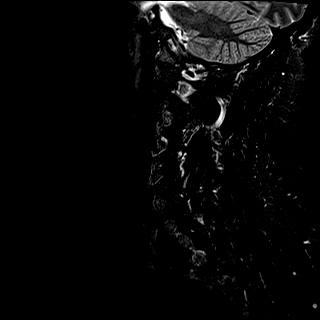
[im 4/13]
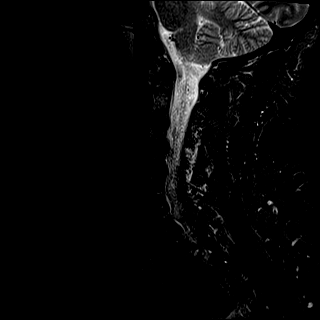
[im 7/13]
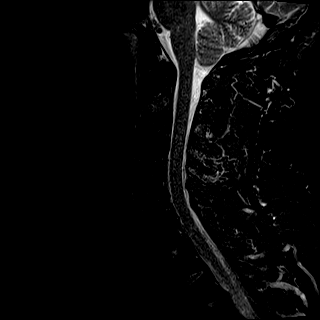
[im 10/13]
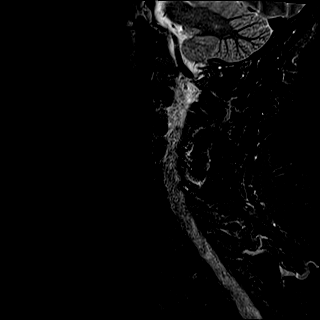
[im 13/13]
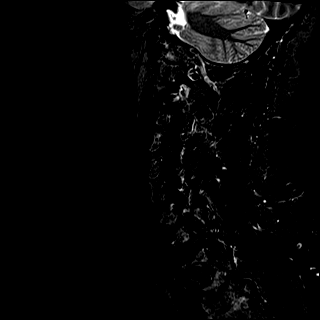

[Series 5: T2 · axial · 3.0mm · 0.62mm/px · z∈[-10,+117]mm · 13 of 34 slices shown (2 of 2)]
[im 1/34]
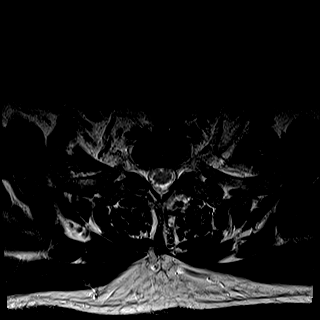
[im 3/34]
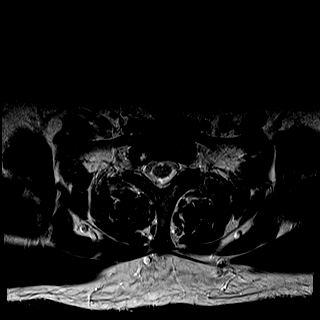
[im 6/34]
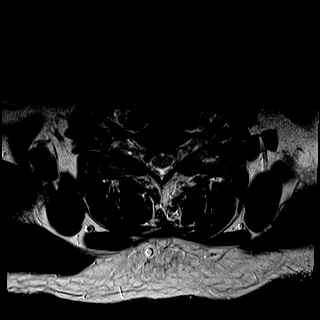
[im 8/34]
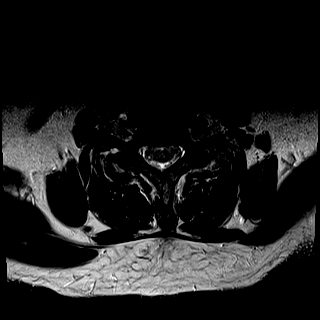
[im 11/34]
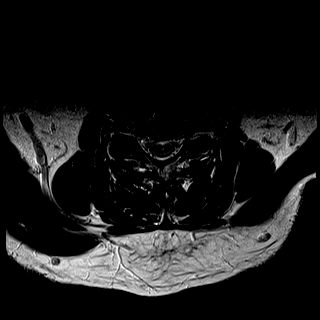
[im 13/34]
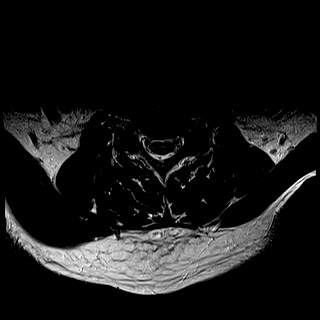
[im 16/34]
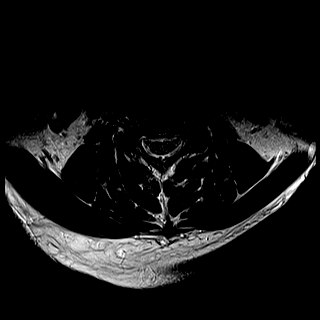
[im 18/34]
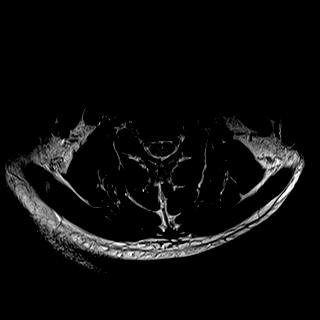
[im 21/34]
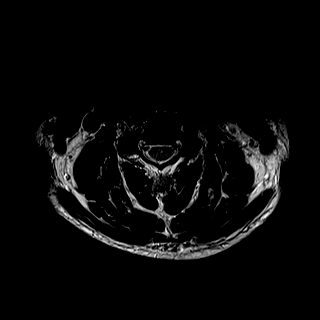
[im 23/34]
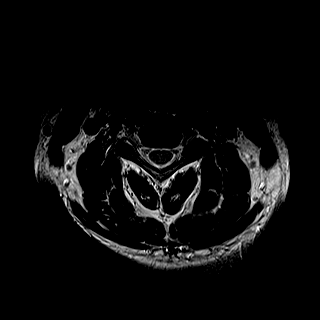
[im 26/34]
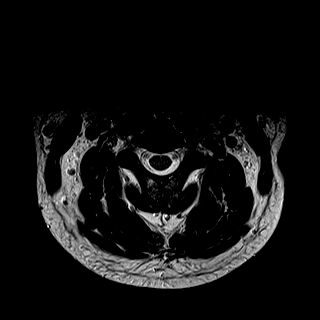
[im 28/34]
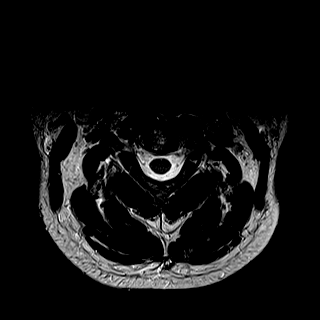
[im 34/34]
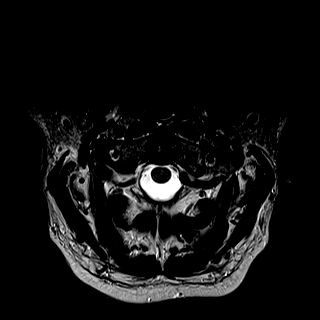

[Series 6: mpgr ax · axial · 3.0mm · 0.35mm/px · z∈[-5,+122]mm · 8 of 34 slices shown]
[im 1/34]
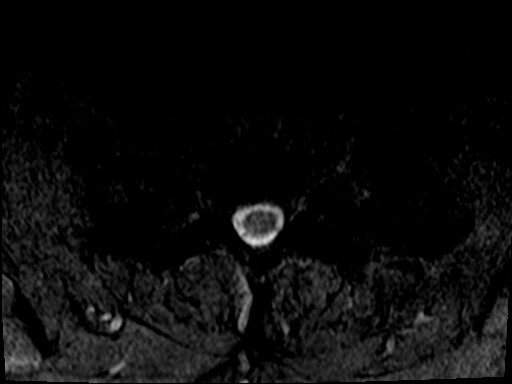
[im 6/34]
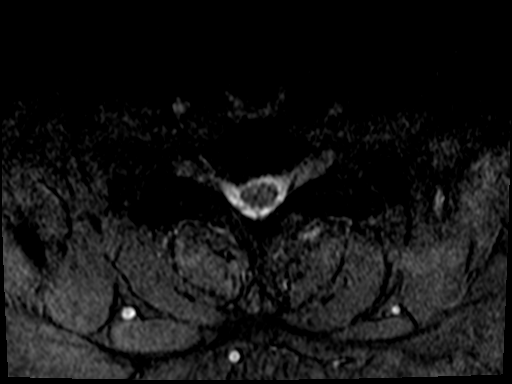
[im 11/34]
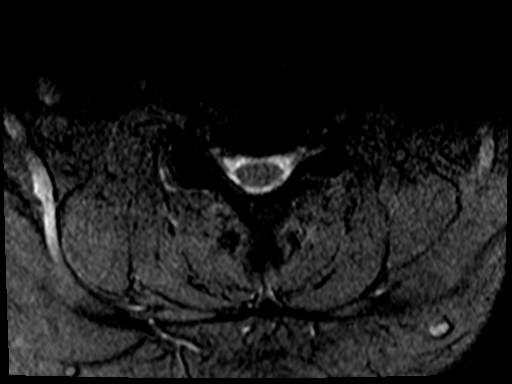
[im 16/34]
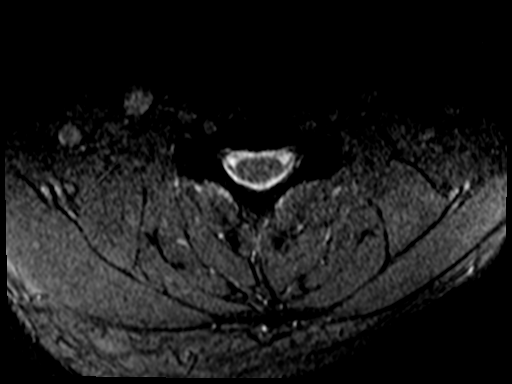
[im 18/34]
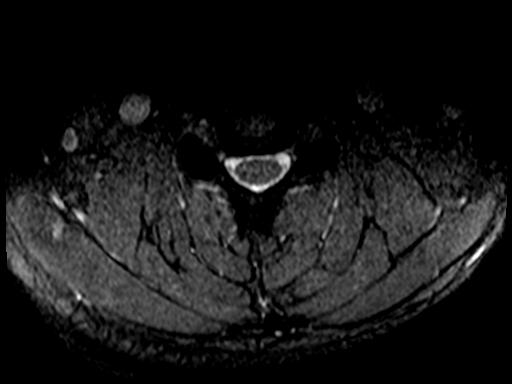
[im 23/34]
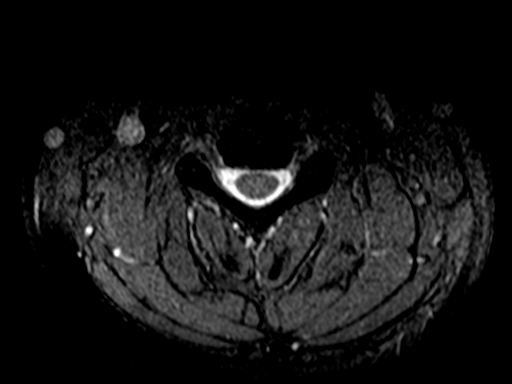
[im 28/34]
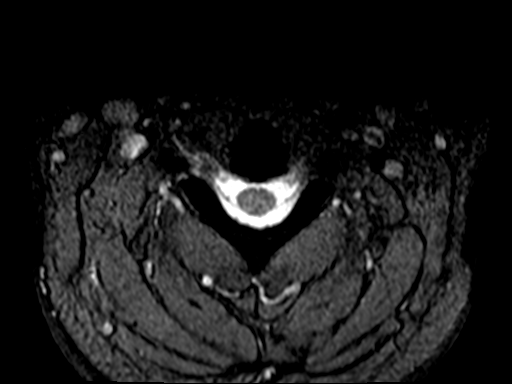
[im 34/34]
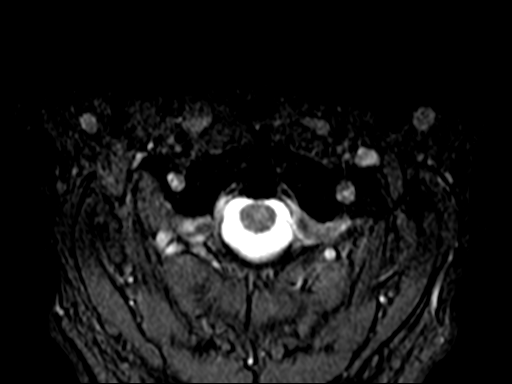

[Series 7: STIR · sagittal · 3.0mm · 0.69mm/px · 5 of 13 slices shown (2 of 2)]
[im 1/13]
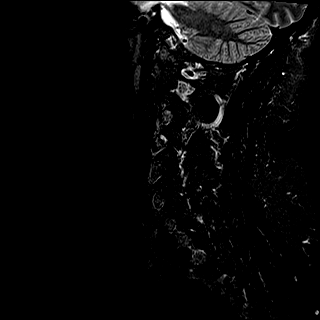
[im 4/13]
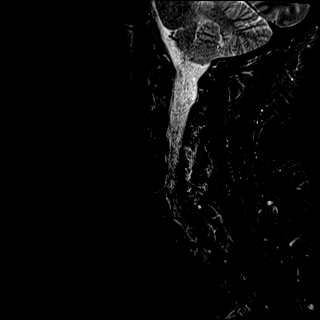
[im 7/13]
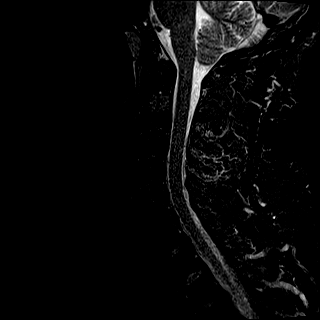
[im 10/13]
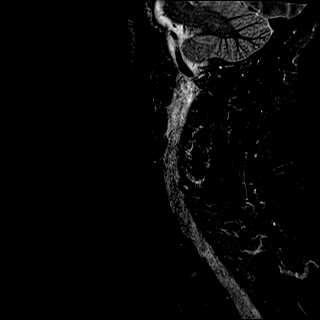
[im 13/13]
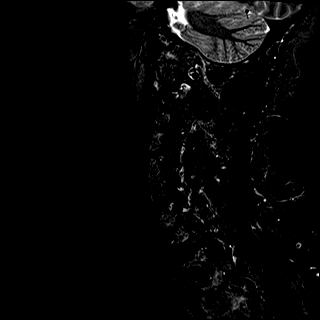

[41 of 48 positions shown; findings below may reference images not displayed]

FINDINGS: Alignment: Normal

Vertebrae: Negative for fracture or mass. 16 mm hemangioma T2-C2
vertebral body. This contains fat on T1 weighted imaging.

Cord: Normal signal and morphology

Posterior Fossa, vertebral arteries, paraspinal tissues: Negative

Disc levels:

C2-3: Negative

C3-4: Mild disc degeneration and small central disc protrusion.
Negative for stenosis

C4-5: Mild disc degeneration and mild facet degeneration. Mild right
foraminal narrowing due to spurring. Left foramen patent

C5-6: Mild disc degeneration and spurring. Mild facet degeneration
on the right. No significant stenosis

C6-7: Small central disc protrusion. No significant spinal or
foraminal stenosis

C7-T1: Negative
IMPRESSION: Mild degenerative change cervical spine. Negative for neural
impingement

Benign hemangioma C2 vertebral body.

## 2020-03-16 ENCOUNTER — Other Ambulatory Visit: Payer: Self-pay | Admitting: Neurology

## 2020-03-16 ENCOUNTER — Encounter: Payer: Self-pay | Admitting: Physician Assistant

## 2020-03-16 DIAGNOSIS — D1809 Hemangioma of other sites: Secondary | ICD-10-CM | POA: Insufficient documentation

## 2020-03-16 DIAGNOSIS — M5030 Other cervical disc degeneration, unspecified cervical region: Secondary | ICD-10-CM | POA: Insufficient documentation

## 2020-03-16 MED ORDER — ALPRAZOLAM 0.5 MG PO TABS: ORAL_TABLET | ORAL | 0 refills | Status: DC

## 2020-03-16 MED ORDER — TADALAFIL 20 MG PO TABS: 10.0000 mg | ORAL_TABLET | ORAL | 2 refills | Status: DC | PRN

## 2020-03-16 NOTE — Addendum Note (Signed)
Addended by: Donella Stade on: 03/16/2020 07:52 AM   Modules accepted: Orders

## 2020-03-16 NOTE — Telephone Encounter (Signed)
I have routed the request to MD for review and she will the medication in for the pt.

## 2020-03-16 NOTE — Telephone Encounter (Signed)
Wesley Mckay,   The MRI shows 80mm hemangioma at C2. This is benign spinal tumor. At times they can cause pain and symptoms but also not bother you at all. If having any C2 distribution pain we could consider a neurosurgery consult. You also have some mild degenerative disc disease.

## 2020-03-16 NOTE — Addendum Note (Signed)
Addended by: Donella Stade on: 03/16/2020 04:53 PM   Modules accepted: Orders

## 2020-03-16 NOTE — Telephone Encounter (Signed)
Pt is needing something called in for his claustrophobia for his MRI of the Brain on 03/23/20 sent in to the [facility] on [address].

## 2020-03-23 ENCOUNTER — Other Ambulatory Visit: Payer: Self-pay

## 2020-03-23 ENCOUNTER — Ambulatory Visit (INDEPENDENT_AMBULATORY_CARE_PROVIDER_SITE_OTHER): Payer: 59

## 2020-03-23 DIAGNOSIS — G40909 Epilepsy, unspecified, not intractable, without status epilepticus: Secondary | ICD-10-CM | POA: Diagnosis not present

## 2020-03-23 IMAGING — MR MR HEAD WO/W CM
15 series · 48 of 48 positions shown · IV contrast (gadavist)
Comparison: Head CT [DATE].

CLINICAL DATA: Seizure, nontraumatic.

EXAM:
MRI HEAD WITHOUT AND WITH CONTRAST
TECHNIQUE: Multiplanar, multiecho pulse sequences of the brain and surrounding
structures were obtained without and with intravenous contrast.
CONTRAST:  10mL GADAVIST GADOBUTROL 1 MMOL/ML IV SOLN

[Series 3: DWI · axial · 3.0mm · 1.20mm/px · z∈[-6,+146]mm · 6 of 107 slices shown (1 of 4)]
[im 1/107]
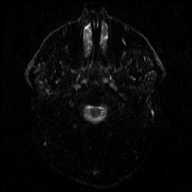
[im 22/107]
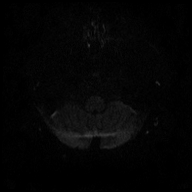
[im 43/107]
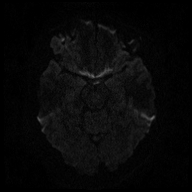
[im 64/107]
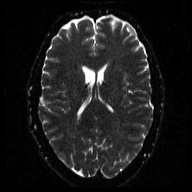
[im 85/107]
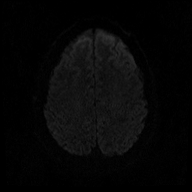
[im 107/107]
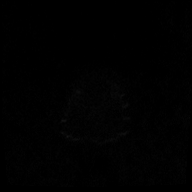

[Series 4: DWI · axial · 3.0mm · 1.20mm/px · z∈[-6,+146]mm · 3 of 55 slices shown (2 of 4)]
[im 1/55]
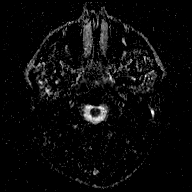
[im 28/55]
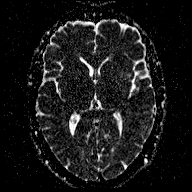
[im 55/55]
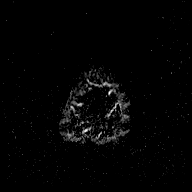

[Series 5: DWI · coronal · 3.0mm · 1.15mm/px · 5 of 100 slices shown (3 of 4)]
[im 1/100]
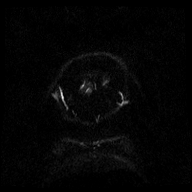
[im 25/100]
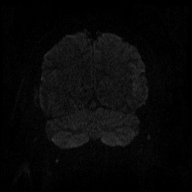
[im 50/100]
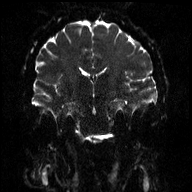
[im 75/100]
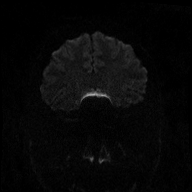
[im 100/100]
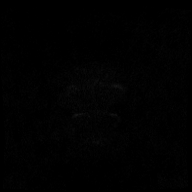

[Series 6: DWI · coronal · 3.0mm · 1.15mm/px · 2 of 53 slices shown (4 of 4)]
[im 1/53]
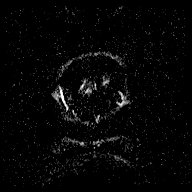
[im 53/53]
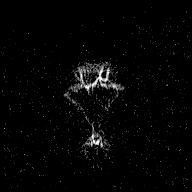

[Series 7: T1 · sagittal · 5.0mm · 0.45mm/px · 1 of 26 slices shown (1 of 2)]
[im 1/26]
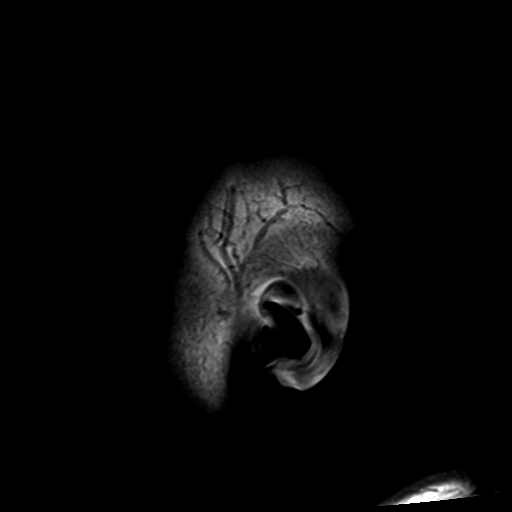

[Series 8: T2 · axial · 5.0mm · 0.72mm/px · 1 of 23 slices shown (1 of 3)]
[im 1/23]
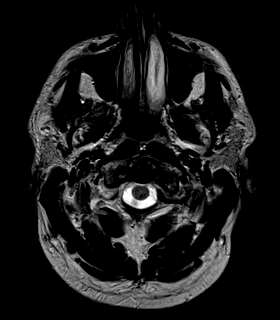

[Series 9: FLAIR · axial · 3.0mm · 0.45mm/px · z∈[-6,+145]mm · 3 of 55 slices shown (1 of 2)]
[im 1/55]
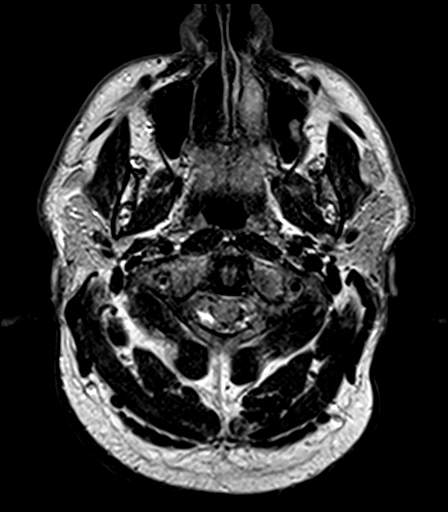
[im 28/55]
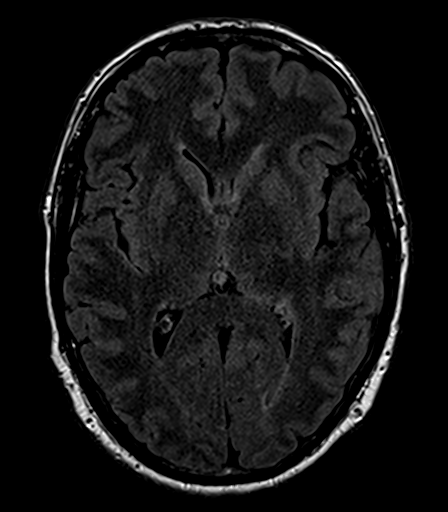
[im 55/55]
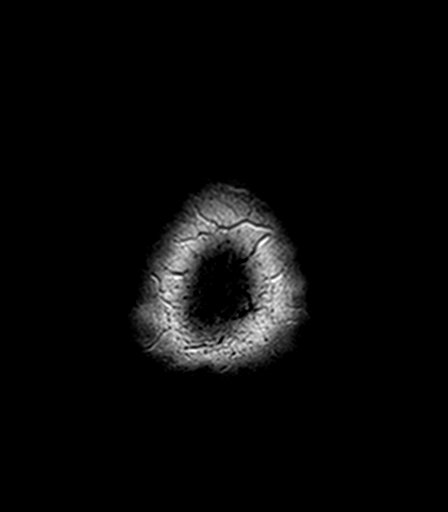

[Series 10: T2 · axial · 5.0mm · 0.72mm/px · 1 of 23 slices shown (2 of 3)]
[im 1/23]
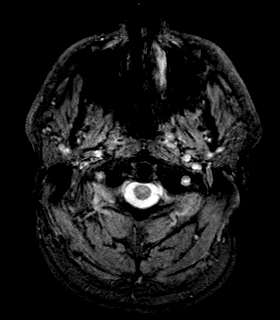

[Series 11: T1 · axial · 1.0mm · 1.00mm/px · z∈[+3,+152]mm · 7 of 160 slices shown (2 of 2)]
[im 1/160]
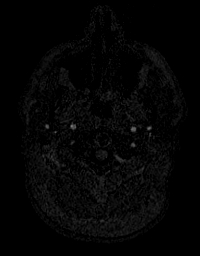
[im 27/160]
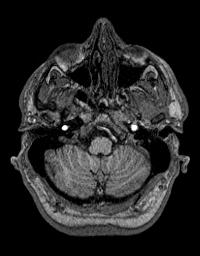
[im 54/160]
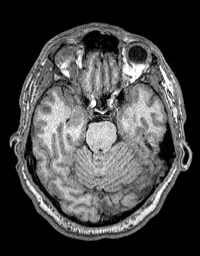
[im 80/160]
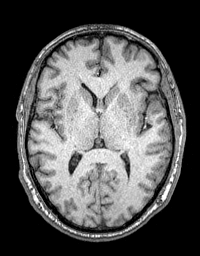
[im 107/160]
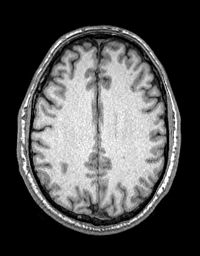
[im 133/160]
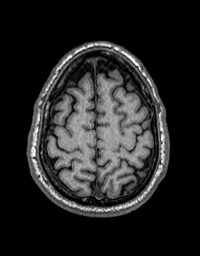
[im 160/160]
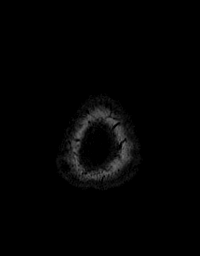

[Series 12: T2 · coronal · 3.0mm · 0.47mm/px · 2 of 37 slices shown (3 of 3)]
[im 1/37]
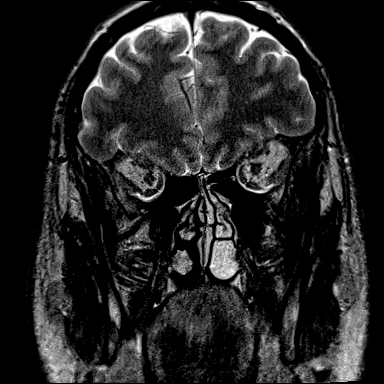
[im 37/37]
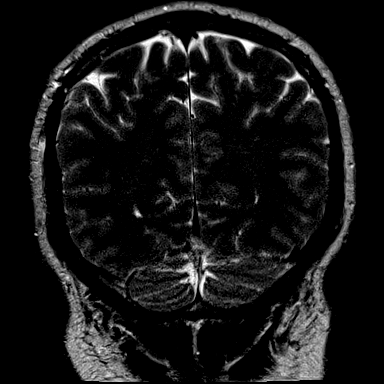

[Series 13: FLAIR · coronal · 3.0mm · 0.45mm/px · 3 of 66 slices shown (2 of 2)]
[im 1/66]
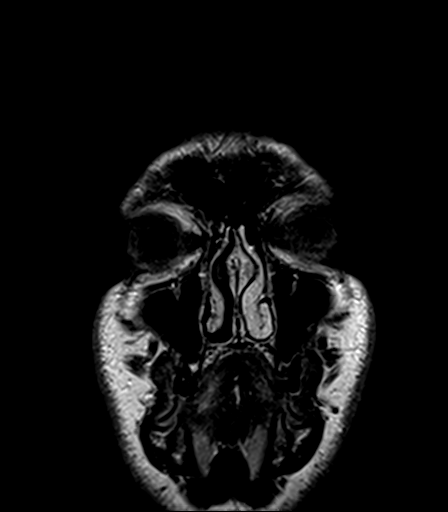
[im 33/66]
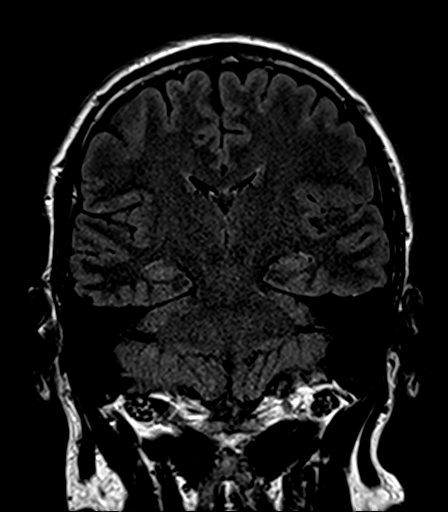
[im 66/66]
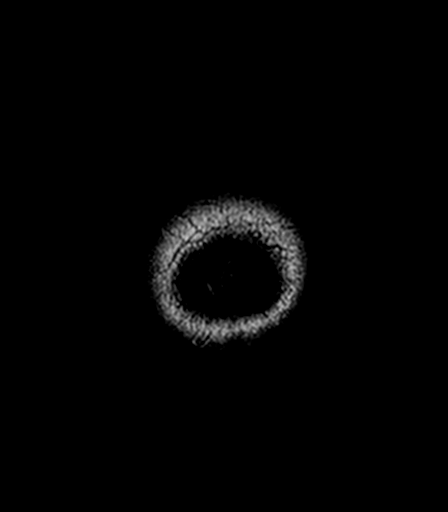

[Series 14: T2 post-contrast · coronal · 5.0mm · 0.43mm/px · 1 of 31 slices shown]
[im 1/31]
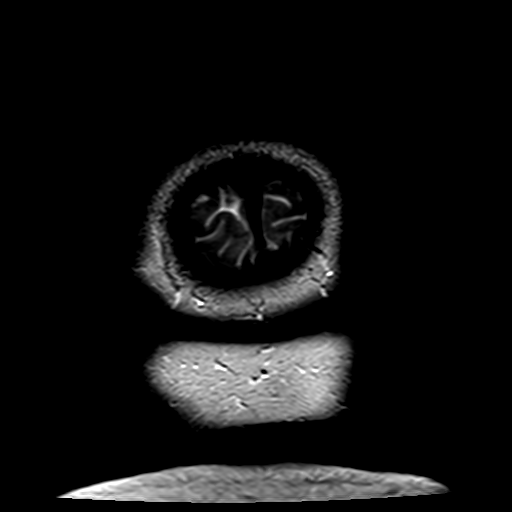

[Series 15: T1 post-contrast · axial · 1.0mm · 1.00mm/px · z∈[+3,+152]mm · 7 of 160 slices shown (1 of 2)]
[im 1/160]
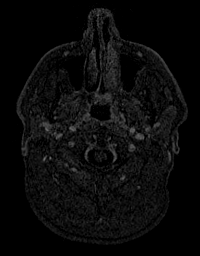
[im 27/160]
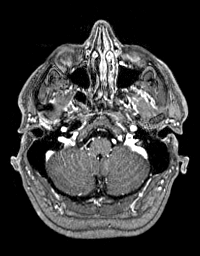
[im 54/160]
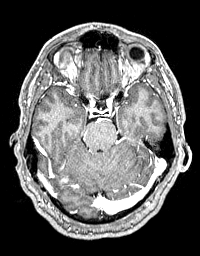
[im 80/160]
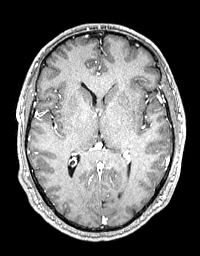
[im 107/160]
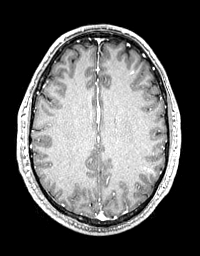
[im 133/160]
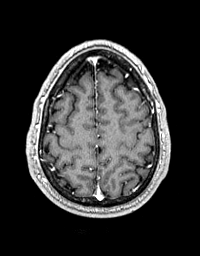
[im 160/160]
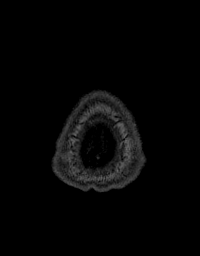

[Series 16: T1 post-contrast · coronal · 5.0mm · 0.43mm/px · 1 of 31 slices shown (2 of 2)]
[im 1/31]
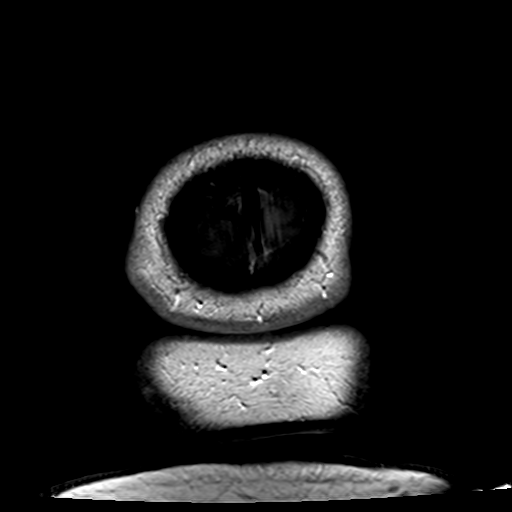

[Series 100: cor mpr · coronal · 1.0mm · 1.00mm/px · 5 of 119 slices shown]
[im 1/119]
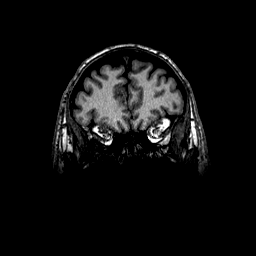
[im 30/119]
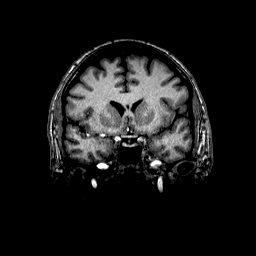
[im 60/119]
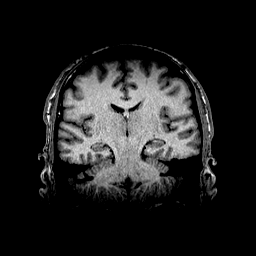
[im 89/119]
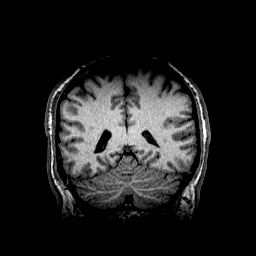
[im 119/119]
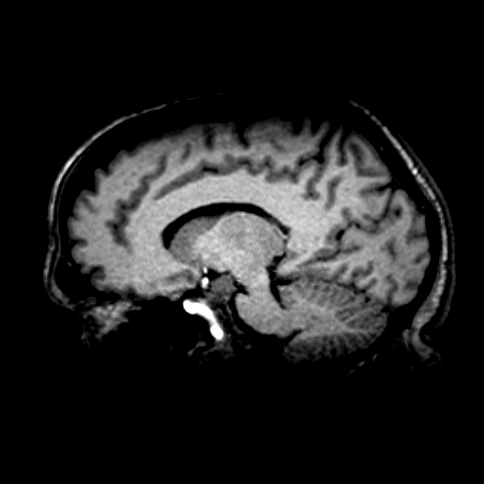

[48 of 48 positions shown; findings below may reference images not displayed]

FINDINGS: Brain: No acute infarction, hemorrhage, hydrocephalus, extra-axial
collection or mass lesion. The brain parenchyma has normal
morphology and signal characteristics. Mesial temporal lobes are
symmetric and with normal signal characteristics. No focus of
abnormal contrast enhancement.

Vascular: Normal flow voids.

Skull and upper cervical spine: Normal marrow signal.

Sinuses/Orbits: Mucosal thickening of the ethmoid cells. The orbits
are maintained.
IMPRESSION: Unremarkable MRI of the brain.

## 2020-03-23 MED ORDER — GADOBUTROL 1 MMOL/ML IV SOLN
10.0000 mL | Freq: Once | INTRAVENOUS | Status: AC | PRN
  Administered 2020-03-23: 10 mL via INTRAVENOUS

## 2020-03-23 NOTE — Progress Notes (Signed)
Normal MRI brain, no evidence of scar tissue, bleed, tumor or dysplasia.

## 2020-03-24 ENCOUNTER — Encounter: Payer: Self-pay | Admitting: Neurology

## 2020-04-17 ENCOUNTER — Other Ambulatory Visit: Payer: Self-pay | Admitting: Physician Assistant

## 2020-04-17 DIAGNOSIS — M62838 Other muscle spasm: Secondary | ICD-10-CM

## 2020-04-17 DIAGNOSIS — M542 Cervicalgia: Secondary | ICD-10-CM

## 2020-05-19 ENCOUNTER — Other Ambulatory Visit: Payer: Self-pay | Admitting: Physician Assistant

## 2020-05-19 DIAGNOSIS — M542 Cervicalgia: Secondary | ICD-10-CM

## 2020-05-19 DIAGNOSIS — M62838 Other muscle spasm: Secondary | ICD-10-CM

## 2020-06-08 ENCOUNTER — Other Ambulatory Visit: Payer: Self-pay

## 2020-06-08 ENCOUNTER — Ambulatory Visit (INDEPENDENT_AMBULATORY_CARE_PROVIDER_SITE_OTHER): Payer: 59 | Admitting: Sports Medicine

## 2020-06-08 ENCOUNTER — Ambulatory Visit (INDEPENDENT_AMBULATORY_CARE_PROVIDER_SITE_OTHER): Payer: 59

## 2020-06-08 DIAGNOSIS — M25811 Other specified joint disorders, right shoulder: Secondary | ICD-10-CM

## 2020-06-08 DIAGNOSIS — M25511 Pain in right shoulder: Secondary | ICD-10-CM

## 2020-06-08 DIAGNOSIS — G8929 Other chronic pain: Secondary | ICD-10-CM

## 2020-06-08 IMAGING — DX DG SHOULDER 2+V*R*
3 series · 3 of 3 positions shown · non-contrast
Comparison: [DATE]

CLINICAL DATA: Anterior right shoulder pain after multiple seizures
over multiple years.

EXAM:
RIGHT SHOULDER - 2+ VIEW

[shoulder grashey]
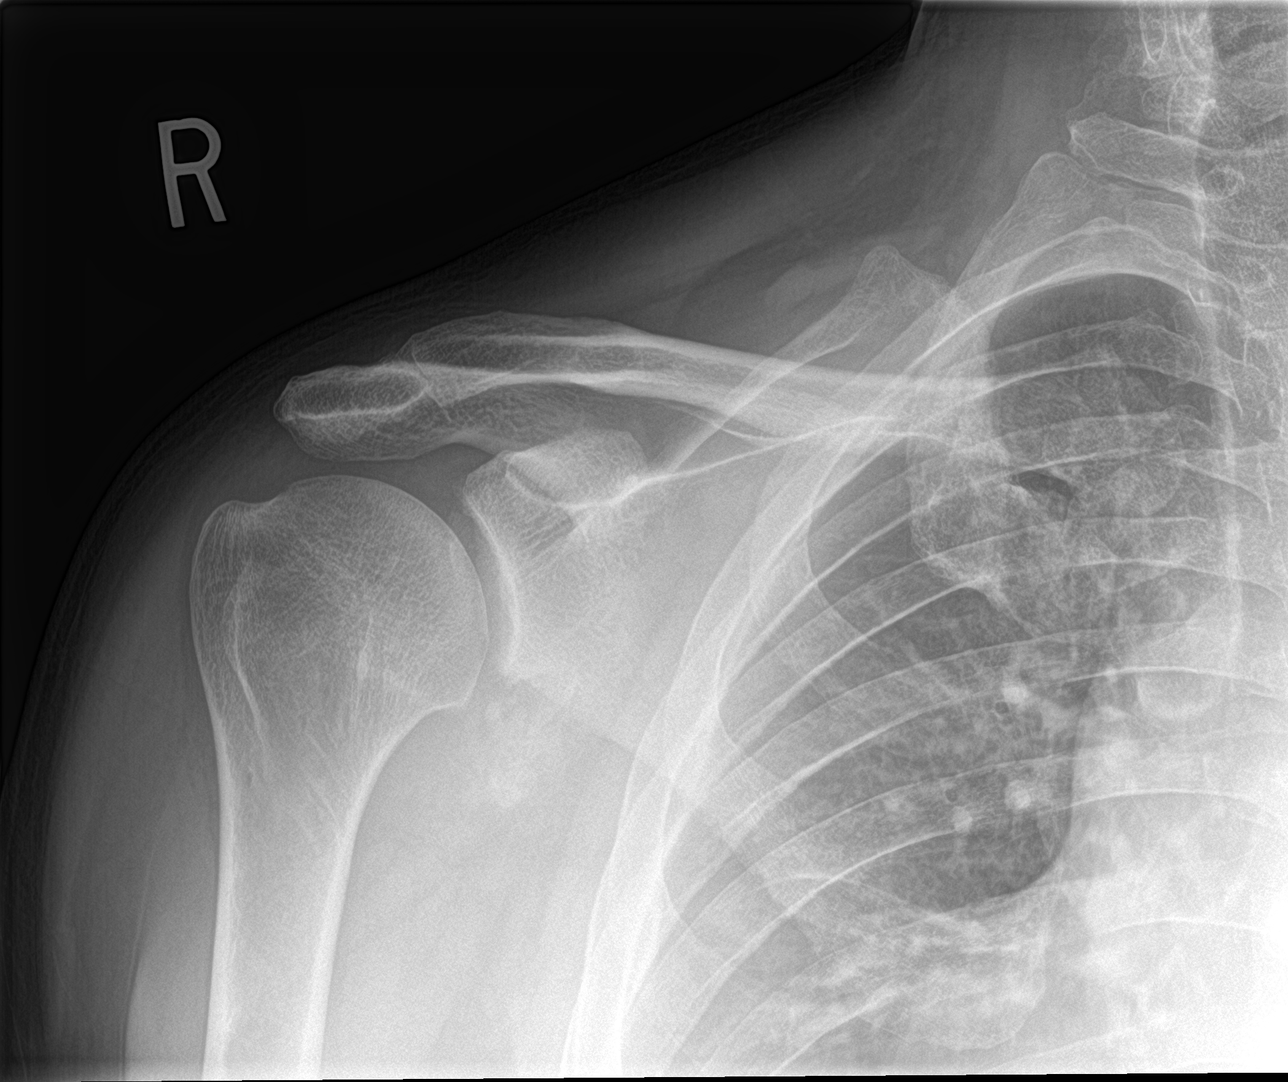

[shoulder y view]
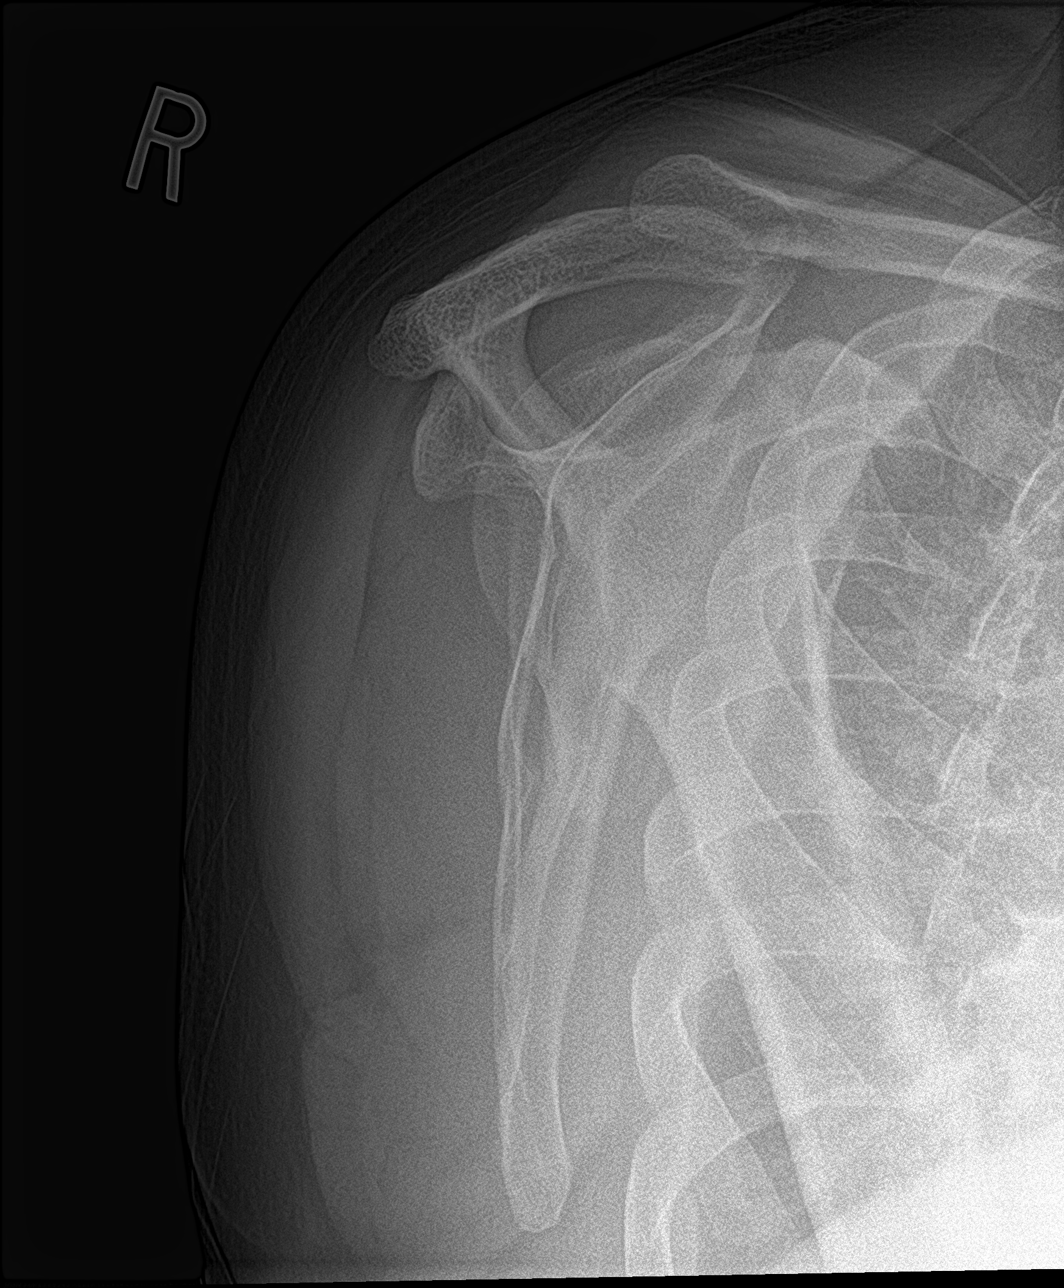

[shoulder axillary]
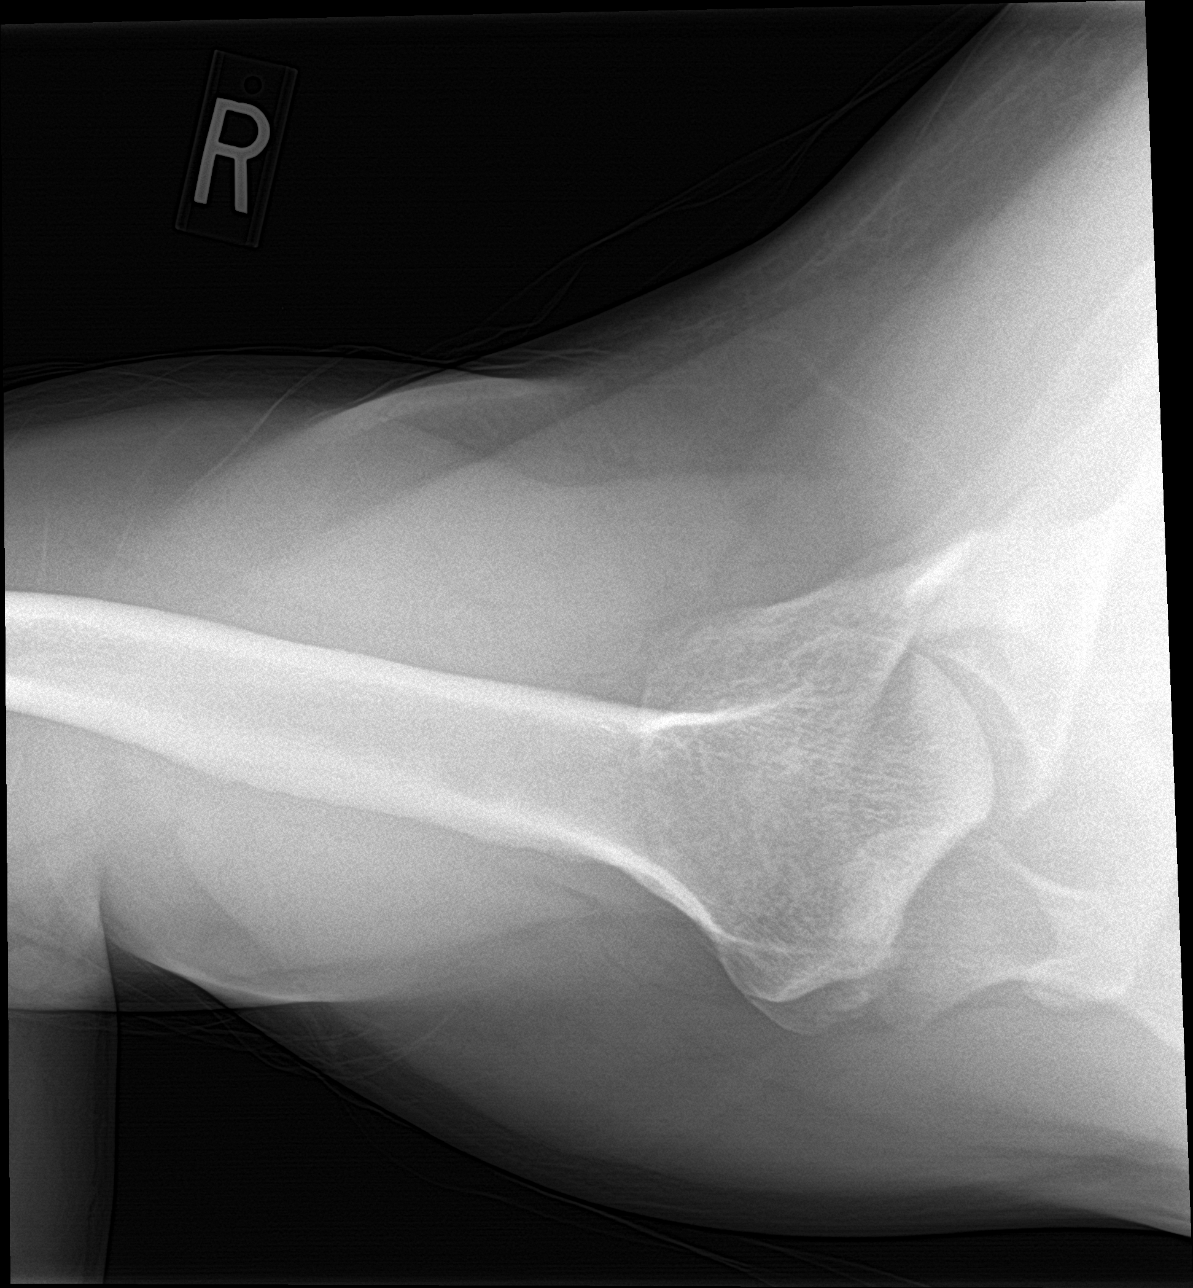

[3 of 3 positions shown; findings below may reference images not displayed]

FINDINGS: On the frontal view, there is interval ill-defined calcific density
with interval ill-defined margins of the inferior glenoid. This area
measures 3.7 cm in maximum diameter. The remainder of the bones and
soft tissues are unremarkable.
IMPRESSION: Findings suspicious for an exophytic neoplasm arising from the
inferior aspect of the glenoid. Further evaluation with MRI of the
shoulder without and with contrast is recommended.

These results will be called to the ordering clinician or
representative by the Radiologist Assistant, and communication
documented in the PACS or [REDACTED].

## 2020-06-08 NOTE — Assessment & Plan Note (Signed)
This is a pleasant 40 year old male, he has epilepsy with a history of grand mal seizures, he has dislocated his left shoulder in the past. For the past several months, since at least October he has noted increasing pain and loss of motion in his right shoulder, on exam he has pain with most motions, very limited external rotation to about 10 degrees, rotator cuff strength is however normal. 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 Adding x-rays, we injected his glenohumeral joint today, home physical therapy added. Return to see me in approximately 6 weeks, we will consider MRI if no better.

## 2020-06-08 NOTE — Progress Notes (Addendum)
    Procedures performed today:    Procedure: Real-time Ultrasound Guided injection of the right glenohumeral joint Device: Samsung [id number]  Verbal informed consent obtained.  Time-out conducted.  Noted no overlying erythema, induration, or other signs of local infection.  Skin prepped in a sterile fashion.  Local anesthesia: Topical Ethyl chloride.  With sterile technique and under real time ultrasound guidance:  Unhealthy appearing labral and posterior glenoid structures, 1 cc Kenalog 40, 2 cc lidocaine, 2 cc bupivacaine injected easily Completed without difficulty  Advised to call if fevers/chills, erythema, induration, drainage, or persistent bleeding.  Images permanently stored and available for review in PACS.  Impression: Technically successful ultrasound guided injection.  Independent interpretation of notes and tests performed by another provider:   None.  Brief History, Exam, Impression, and Recommendations:    Chronic right shoulder pain This is a pleasant 40 year old male, he has epilepsy with a history of grand mal seizures, he has dislocated his left shoulder in the past. For the past several months, since at least October he has noted increasing pain and loss of motion in his right shoulder, on exam he has pain with most motions, very limited external rotation to about 10 degrees, rotator cuff strength is however normal. 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 Adding x-rays, we injected his glenohumeral joint today, home physical therapy added. Return to see me in approximately 6 weeks, we will consider MRI if no better.  Mass of joint of right shoulder There is a poorly defined mass that looks like it is coming from the inferior socket, radiology and I are concerned that this is a tumor, he will need an MRI of the shoulder with and without contrast.    ___________________________________________ Gwen Her. Dianah Field, M.D., ABFM., CAQSM. Primary  Care and [place] Instructor of [place] [employer] [facility]

## 2020-06-10 DIAGNOSIS — M25811 Other specified joint disorders, right shoulder: Secondary | ICD-10-CM | POA: Insufficient documentation

## 2020-06-10 NOTE — Assessment & Plan Note (Signed)
There is a poorly defined mass that looks like it is coming from the inferior socket, radiology and I are concerned that this is a tumor, he will need an MRI of the shoulder with and without contrast.

## 2020-06-10 NOTE — Addendum Note (Signed)
Addended by: Silverio Decamp on: 06/10/2020 09:30 AM   Modules accepted: Orders

## 2020-06-15 ENCOUNTER — Encounter: Payer: Self-pay | Admitting: Physician Assistant

## 2020-06-15 ENCOUNTER — Ambulatory Visit (INDEPENDENT_AMBULATORY_CARE_PROVIDER_SITE_OTHER): Payer: 59 | Admitting: Physician Assistant

## 2020-06-15 ENCOUNTER — Other Ambulatory Visit: Payer: Self-pay

## 2020-06-15 VITALS — BP 140/76 | HR 68 | Temp 98.9°F | Resp 20 | Ht 75.0 in | Wt 265.0 lb

## 2020-06-15 DIAGNOSIS — E559 Vitamin D deficiency, unspecified: Secondary | ICD-10-CM

## 2020-06-15 DIAGNOSIS — F902 Attention-deficit hyperactivity disorder, combined type: Secondary | ICD-10-CM

## 2020-06-15 DIAGNOSIS — E785 Hyperlipidemia, unspecified: Secondary | ICD-10-CM

## 2020-06-15 DIAGNOSIS — F5101 Primary insomnia: Secondary | ICD-10-CM

## 2020-06-15 DIAGNOSIS — E538 Deficiency of other specified B group vitamins: Secondary | ICD-10-CM

## 2020-06-15 DIAGNOSIS — E039 Hypothyroidism, unspecified: Secondary | ICD-10-CM

## 2020-06-15 DIAGNOSIS — E291 Testicular hypofunction: Secondary | ICD-10-CM

## 2020-06-15 DIAGNOSIS — G40909 Epilepsy, unspecified, not intractable, without status epilepticus: Secondary | ICD-10-CM

## 2020-06-15 NOTE — Progress Notes (Signed)
Subjective:    Patient ID: Wesley Mckay, male    DOB: 01/28/1981, 40 y.o.   MRN: [id number]  HPI  Pt is a 40 yo male with hypothyroidism, low testosterone, ADHD, insomnia, OSA, low b12, seizures who presents to the clinic for medication refills.   His medication was changed from keppra to lamictal in feb and not had a seizure since. Managed by [facility] neurology.  OSA-using CPAP nightly.   ADHD-controlled on vyvanse. No SOB, increase in anxiety, headaches, vision changes.   Ongoing insomnia-controlled with ambien.   Needs valium for upcoming MRI of shoulder.      .. Active Ambulatory Problems    Diagnosis Date Noted  . Hypothyroidism 08/01/2007  . GERD 08/01/2007  . SEIZURE DISORDER 08/01/2007  . Primary insomnia 08/01/2007  . Anxiety disorder 10/04/2010  . 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 Bilateral foot pain 08/16/2012  . Bilateral plantar fasciitis 11/14/2014  . Inattention 11/17/2014  . No energy 11/17/2014  . Hyperlipidemia LDL goal <130 11/18/2014  . Attention deficit hyperactivity disorder (ADHD), predominantly inattentive type 12/19/2014  . Major depressive disorder, recurrent episode, moderate ([place]) 12/19/2014  . B12 deficiency 09/15/2015  . Sinus bradycardia 09/15/2015  . Seizure disorder ([place]) 02/29/2016  . Erectile dysfunction 02/29/2016  . Vitamin D deficiency 04/11/2016  . Anxiety 11/17/2016  . Muscle spasm 07/14/2017  . Neck pain 07/14/2017  . Attention deficit hyperactivity disorder (ADHD), combined type 07/14/2017  . 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 Nonspecific findings on examination of blood 09/18/2012  . Sleeping difficulty 09/18/2012  . Non-restorative sleep 10/30/2018  . Snoring 10/30/2018  . Class 1 obesity due to excess calories without serious comorbidity with body mass index (BMI) of 34.0 to 34.9 in adult 10/30/2018  . 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 Elevated blood pressure reading 11/02/2018  . OSA (obstructive sleep apnea)  02/22/2019  . DDD (degenerative disc disease), cervical 03/16/2020  . Spinal hemangioma 03/16/2020  . Chronic right shoulder pain 06/08/2020  . Mass of joint of right shoulder 06/10/2020   Resolved Ambulatory Problems    Diagnosis Date Noted  . FOOT PAIN, RIGHT 11/29/2007   Past Medical History:  Diagnosis Date  . GERD (gastroesophageal reflux disease)   . History of dislocation of shoulder   . Insomnia   . Obesity   . Seizures ([place]) 03/2007  . Thyroid disease        Review of Systems  All other systems reviewed and are negative.      Objective:   Physical Exam Vitals reviewed.  Constitutional:      Appearance: Normal appearance.  Cardiovascular:     Rate and Rhythm: Normal rate and regular rhythm.     Pulses: Normal pulses.     Heart sounds: Normal heart sounds.  Pulmonary:     Effort: Pulmonary effort is normal.     Breath sounds: Normal breath sounds.  Neurological:     General: No focal deficit present.     Mental Status: He is alert and oriented to person, place, and time.  Psychiatric:        Mood and Affect: Mood normal.           Assessment & Plan:  Marland KitchenMarland KitchenJihaad was seen today for adhd.  Diagnoses and all orders for this visit:  Hypothyroidism, unspecified type -     COMPLETE METABOLIC PANEL WITH GFR -     CBC w/Diff/Platelet -     TSH  Attention deficit hyperactivity disorder (ADHD), combined type -  lisdexamfetamine (VYVANSE) 60 MG capsule; Take 1 capsule (60 mg total) by mouth every morning. -     lisdexamfetamine (VYVANSE) 60 MG capsule; Take 1 capsule (60 mg total) by mouth every morning. -     lisdexamfetamine (VYVANSE) 60 MG capsule; Take 1 capsule (60 mg total) by mouth every morning.  Primary insomnia -     zolpidem (AMBIEN) 10 MG tablet; TAKE 1 TABLET(10 MG) BY MOUTH AT BEDTIME AS NEEDED FOR SLEEP  Male hypogonadism -     COMPLETE METABOLIC PANEL WITH GFR -     CBC w/Diff/Platelet -     PSA -     Testosterone Total,Free,Bio,  Males  B12 deficiency -     B12 and Folate Panel  Hyperlipidemia LDL goal <130 -     Lipid Panel w/reflex Direct LDL  Vitamin D deficiency -     VITAMIN D 25 Hydroxy (Vit-D Deficiency, Fractures)  Seizure disorder (HCC)  Other orders -     diazepam (VALIUM) 5 MG tablet; Take 1 tab PO 2 hours before procedure or imaging.   Needs fasting labs. Ordered today.   Refilled vyvanse for 3 months.   Pt quit smoking. GREAT JOB>   Follow up with neurology for seizure disorder and management.   Valium for pre MRI sedation ordered.

## 2020-06-16 ENCOUNTER — Telehealth: Payer: Self-pay | Admitting: Sports Medicine

## 2020-06-16 ENCOUNTER — Encounter: Payer: Self-pay | Admitting: Physician Assistant

## 2020-06-16 MED ORDER — DIAZEPAM 5 MG PO TABS: ORAL_TABLET | ORAL | 0 refills | Status: DC

## 2020-06-16 MED ORDER — LISDEXAMFETAMINE DIMESYLATE 60 MG PO CAPS: 60.0000 mg | ORAL_CAPSULE | ORAL | 0 refills | Status: DC

## 2020-06-16 MED ORDER — ZOLPIDEM TARTRATE 10 MG PO TABS: ORAL_TABLET | ORAL | 5 refills | Status: DC

## 2020-06-16 NOTE — Telephone Encounter (Signed)
Peer-to-peer for shoulder MRI completed and approved, approval [id number], expiration 07/31/2020.

## 2020-06-17 ENCOUNTER — Other Ambulatory Visit: Payer: Self-pay | Admitting: Physician Assistant

## 2020-06-17 DIAGNOSIS — M542 Cervicalgia: Secondary | ICD-10-CM

## 2020-06-17 DIAGNOSIS — M62838 Other muscle spasm: Secondary | ICD-10-CM

## 2020-06-22 ENCOUNTER — Ambulatory Visit (INDEPENDENT_AMBULATORY_CARE_PROVIDER_SITE_OTHER): Payer: 59

## 2020-06-22 ENCOUNTER — Other Ambulatory Visit: Payer: Self-pay

## 2020-06-22 DIAGNOSIS — M25511 Pain in right shoulder: Secondary | ICD-10-CM | POA: Diagnosis not present

## 2020-06-22 DIAGNOSIS — G8929 Other chronic pain: Secondary | ICD-10-CM | POA: Diagnosis not present

## 2020-06-22 DIAGNOSIS — M25811 Other specified joint disorders, right shoulder: Secondary | ICD-10-CM | POA: Diagnosis not present

## 2020-06-22 IMAGING — MR MR SHOULDER*R* WO/W CM
9 series · 40 of 40 positions shown · IV contrast (10mL Gadavist)
Comparison: Plain films right shoulder [DATE] and [DATE].

CLINICAL DATA: Chronic right shoulder pain. History of seizures.
Possible mass on the shoulder on prior plain films.

EXAM:
MRI OF THE RIGHT SHOULDER WITHOUT AND WITH CONTRAST
TECHNIQUE: Multiplanar, multisequence MR imaging of the right shoulder was
performed before and after the administration of intravenous
contrast.
CONTRAST:  10 mL GADAVIST IV SOLN

[Series 5: T2 fat-sat · axial · 4.0mm · 0.59mm/px · z∈[-62,+69]mm · 4 of 32 slices shown (1 of 4)]
[im 1/32]
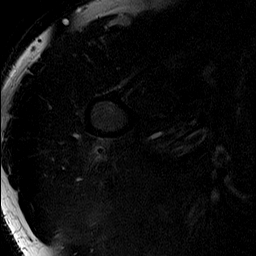
[im 11/32]
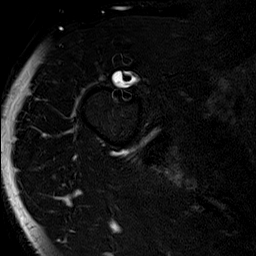
[im 21/32]
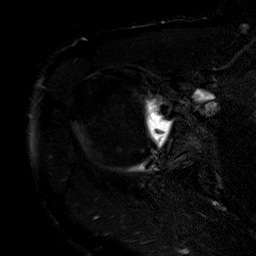
[im 32/32]
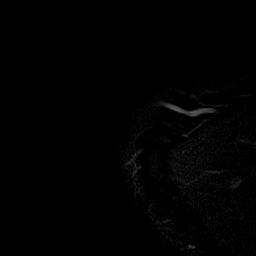

[Series 6: T2 fat-sat · oblique · 4.0mm · 0.62mm/px · 3 of 22 slices shown (2 of 4)]
[im 1/22]
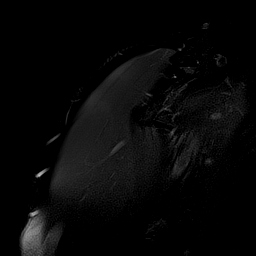
[im 11/22]
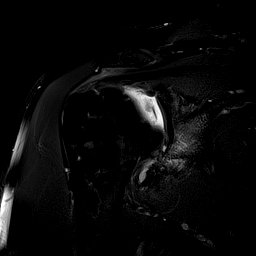
[im 22/22]
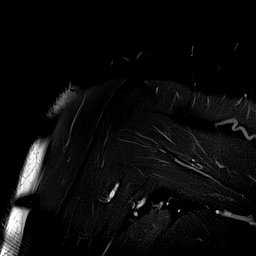

[Series 7: T2 fat-sat · axial · 4.0mm · 0.59mm/px · z∈[-62,+69]mm · 5 of 32 slices shown (3 of 4)]
[im 1/32]
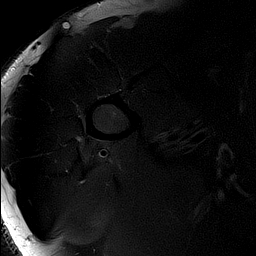
[im 8/32]
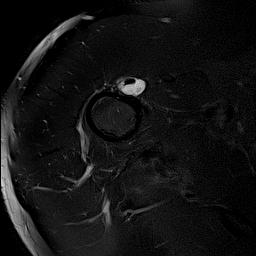
[im 16/32]
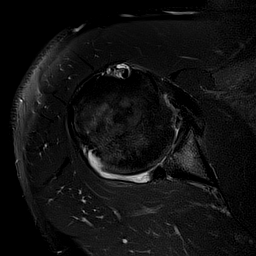
[im 24/32]
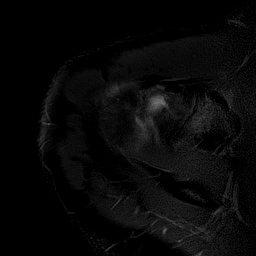
[im 32/32]
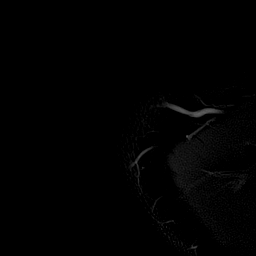

[Series 8: PD fat-sat · oblique · 4.0mm · 0.62mm/px · 4 of 22 slices shown]
[im 1/22]
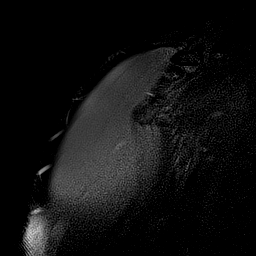
[im 8/22]
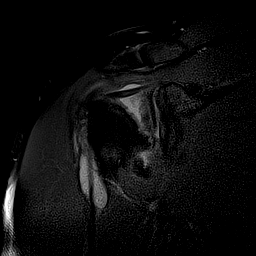
[im 15/22]
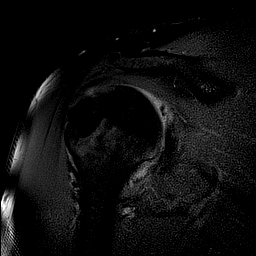
[im 22/22]
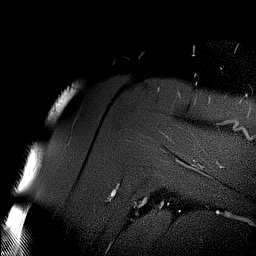

[Series 9: T1 · sagittal · 4.0mm · 0.55mm/px · 5 of 28 slices shown]
[im 1/28]
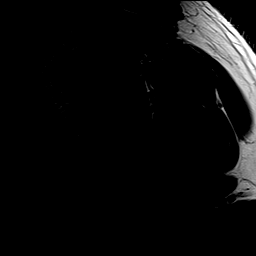
[im 7/28]
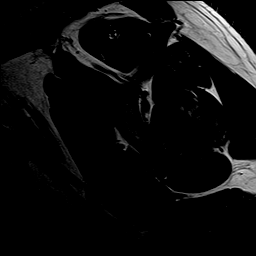
[im 14/28]
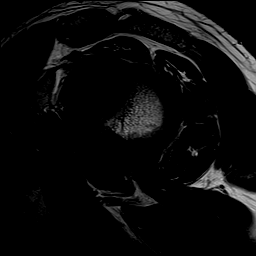
[im 21/28]
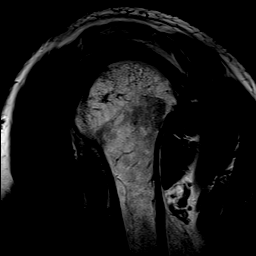
[im 28/28]
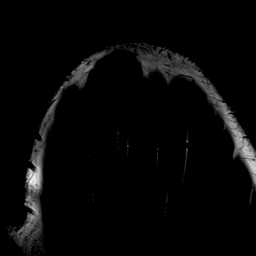

[Series 10: T2 fat-sat · sagittal · 4.0mm · 0.55mm/px · 5 of 28 slices shown (4 of 4)]
[im 1/28]
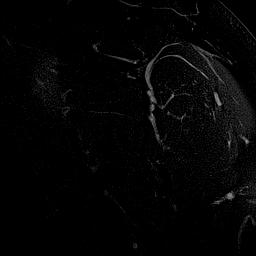
[im 7/28]
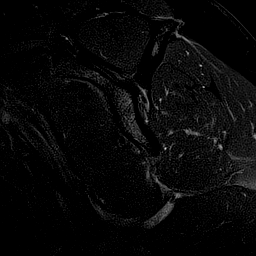
[im 14/28]
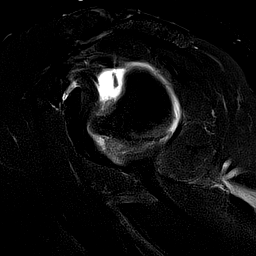
[im 21/28]
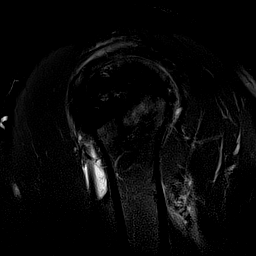
[im 28/28]
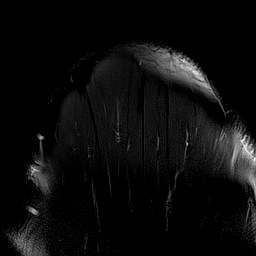

[Series 11: T1 fat-sat · axial · 4.0mm · 0.55mm/px · z∈[-56,+63]mm · 5 of 29 slices shown (1 of 2)]
[im 1/29]
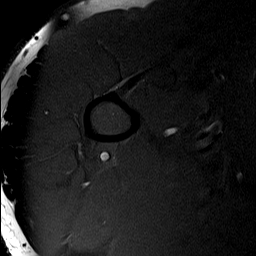
[im 8/29]
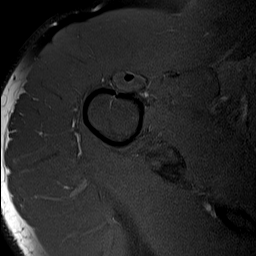
[im 15/29]
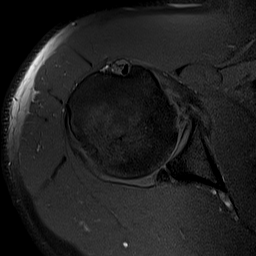
[im 22/29]
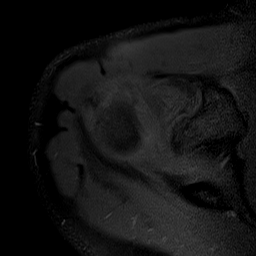
[im 29/29]
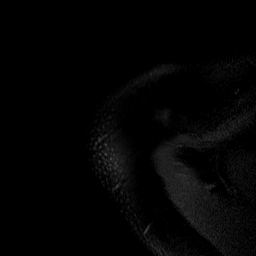

[Series 12: T1 fat-sat post-contrast · axial · 4.0mm · 0.55mm/px · z∈[-56,+63]mm · 5 of 29 slices shown]
[im 1/29]
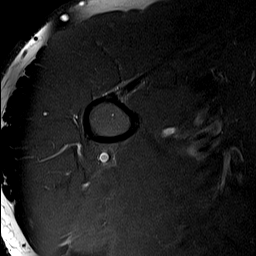
[im 8/29]
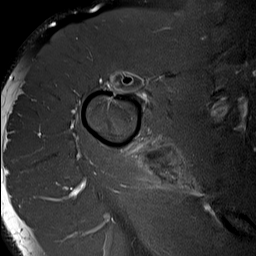
[im 15/29]
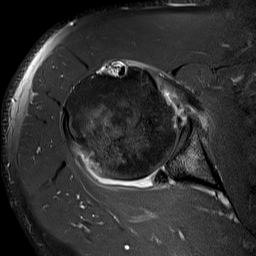
[im 22/29]
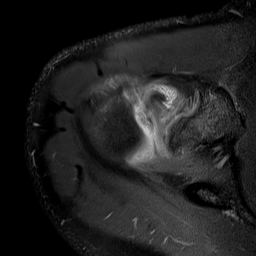
[im 29/29]
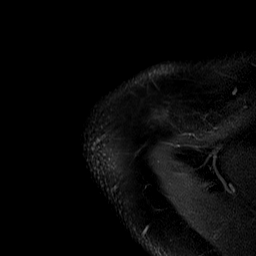

[Series 13: T1 fat-sat · oblique · 4.0mm · 0.50mm/px · 4 of 22 slices shown (2 of 2)]
[im 1/22]
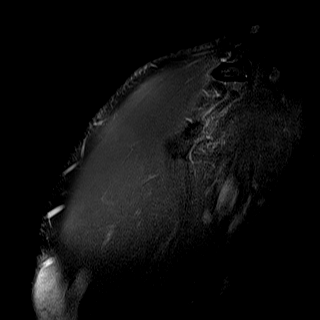
[im 8/22]
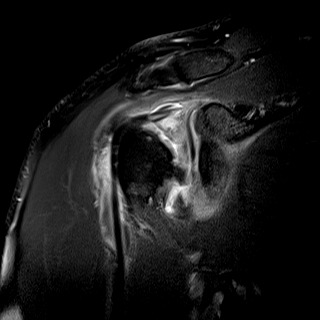
[im 15/22]
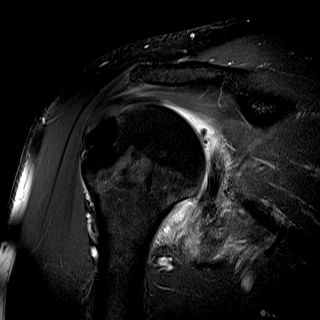
[im 22/22]
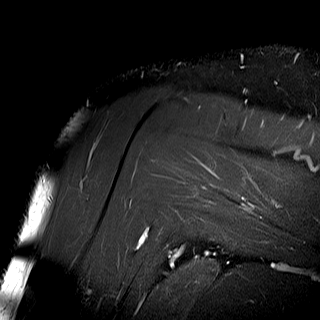

[40 of 40 positions shown; findings below may reference images not displayed]

FINDINGS: Rotator cuff:  Intact.  Mild tendinopathy noted.

Muscles:  Normal without atrophy or focal lesion.

Biceps long head:  Intact.

Acromioclavicular Joint: Appears normal. Type 2 acromion. There is a
small volume of fluid in the subacromial/subdeltoid bursa and mild
enhancement of the bursa.

Glenohumeral Joint: Mild posterior subluxation of the humeral head
on the glenoid is identified. A small glenohumeral joint effusion is
present with synovial thickening and enhancement.

Labrum:  The posterior and inferior labrum are torn.

Bones: There is a large defect in the anterior humeral head
consistent with a reverse Hill-Sachs lesion. Although more subtle,
there appears to be a bony defect along the posterior, inferior
glenoid consistent with a reverse TIGER. Mild marrow edema is seen
in the glenoid.

Other: There is no mass or other evidence of neoplasm.
Calcifications along the inferior glenoid seen on the most recent
examination are consistent with posttraumatic change.
IMPRESSION: Negative for mass.  No neoplastic process.

Findings consistent with recurrent posterior shoulder dislocations
with a large reverse Hill-Sachs lesion and tear of the posterior and
inferior labrum. There is likely a reverse bony Bankart although
evaluation of the glenoid is better performed with CT scan.
Calcified debris in the glenohumeral joint accounts for finding on
prior plain film.

## 2020-06-22 MED ORDER — GADOBUTROL 1 MMOL/ML IV SOLN
10.0000 mL | Freq: Once | INTRAVENOUS | Status: AC | PRN
  Administered 2020-06-22: 10 mL via INTRAVENOUS

## 2020-06-23 ENCOUNTER — Other Ambulatory Visit: Payer: Self-pay | Admitting: Physician Assistant

## 2020-06-23 LAB — CBC WITH DIFFERENTIAL/PLATELET
Absolute Monocytes: 452 cells/uL (ref 200–950)
Basophils Absolute: 53 cells/uL (ref 0–200)
Basophils Relative: 0.5 %
Eosinophils Absolute: 179 cells/uL (ref 15–500)
Eosinophils Relative: 1.7 %
HCT: 46.3 % (ref 38.5–50.0)
Hemoglobin: 15.7 g/dL (ref 13.2–17.1)
Lymphs Abs: 1974 cells/uL (ref 850–3900)
MCH: 29.8 pg (ref 27.0–33.0)
MCHC: 33.9 g/dL (ref 32.0–36.0)
MCV: 88 fL (ref 80.0–100.0)
MPV: 9.9 fL (ref 7.5–12.5)
Monocytes Relative: 4.3 %
Neutro Abs: 7844 cells/uL — ABNORMAL HIGH (ref 1500–7800)
Neutrophils Relative %: 74.7 %
Platelets: 230 10*3/uL (ref 140–400)
RBC: 5.26 10*6/uL (ref 4.20–5.80)
RDW: 13.6 % (ref 11.0–15.0)
Total Lymphocyte: 18.8 %
WBC: 10.5 10*3/uL (ref 3.8–10.8)

## 2020-06-23 LAB — COMPLETE METABOLIC PANEL WITH GFR
AG Ratio: 2.1 (calc) (ref 1.0–2.5)
ALT: 16 U/L (ref 9–46)
AST: 15 U/L (ref 10–40)
Albumin: 4.7 g/dL (ref 3.6–5.1)
Alkaline phosphatase (APISO): 95 U/L (ref 36–130)
BUN: 12 mg/dL (ref 7–25)
CO2: 26 mmol/L (ref 20–32)
Calcium: 9.1 mg/dL (ref 8.6–10.3)
Chloride: 103 mmol/L (ref 98–110)
Creat: 1 mg/dL (ref 0.60–1.35)
GFR, Est African American: 107 mL/min/{1.73_m2} (ref >=60)
GFR, Est Non African American: 92 mL/min/{1.73_m2} (ref >=60)
Globulin: 2.2 g/dL (calc) (ref 1.9–3.7)
Glucose, Bld: 87 mg/dL (ref 65–99)
Potassium: 4.2 mmol/L (ref 3.5–5.3)
Sodium: 137 mmol/L (ref 135–146)
Total Bilirubin: 0.4 mg/dL (ref 0.2–1.2)
Total Protein: 6.9 g/dL (ref 6.1–8.1)

## 2020-06-23 LAB — LIPID PANEL W/REFLEX DIRECT LDL
Cholesterol: 203 mg/dL — ABNORMAL HIGH (ref <200)
HDL: 44 mg/dL (ref >=40)
LDL Cholesterol (Calc): 142 mg/dL (calc) — ABNORMAL HIGH
Non-HDL Cholesterol (Calc): 159 mg/dL (calc) — ABNORMAL HIGH (ref <130)
Total CHOL/HDL Ratio: 4.6 (calc) (ref <5.0)
Triglycerides: 76 mg/dL (ref <150)

## 2020-06-23 LAB — TESTOSTERONE TOTAL,FREE,BIO, MALES
Albumin: 4.7 g/dL (ref 3.6–5.1)
Sex Hormone Binding: 24 nmol/L (ref 10–50)
Testosterone, Bioavailable: 291.5 ng/dL (ref 110.0–575.0)
Testosterone, Free: 136 pg/mL (ref 46.0–224.0)
Testosterone: 735 ng/dL (ref 250–827)

## 2020-06-23 LAB — PSA: PSA: 0.62 ng/mL (ref <=4.00)

## 2020-06-23 LAB — B12 AND FOLATE PANEL
Folate: 2.6 ng/mL — ABNORMAL LOW
Vitamin B-12: 834 pg/mL (ref 200–1100)

## 2020-06-23 LAB — TSH: TSH: 6.43 mIU/L — ABNORMAL HIGH (ref 0.40–4.50)

## 2020-06-23 LAB — VITAMIN D 25 HYDROXY (VIT D DEFICIENCY, FRACTURES): Vit D, 25-Hydroxy: 27 ng/mL — ABNORMAL LOW (ref 30–100)

## 2020-06-24 ENCOUNTER — Encounter: Payer: Self-pay | Admitting: Physician Assistant

## 2020-06-24 DIAGNOSIS — E785 Hyperlipidemia, unspecified: Secondary | ICD-10-CM | POA: Insufficient documentation

## 2020-06-24 DIAGNOSIS — E538 Deficiency of other specified B group vitamins: Secondary | ICD-10-CM | POA: Insufficient documentation

## 2020-06-24 NOTE — Progress Notes (Signed)
[place],   Kidney, liver, glucose look good.  PSA low and stable.  Testosterone great. Do you need refills?  B12 is GREAT.  Folate is low. Need to start 446mcg. Recheck in 3 months.  Vitamin D low. Make sure taking 1000-2000units daily.  Thyroid is showing hypo thyroid. If you are taking daily first thing in morning 30 minutes before eating or taking other medications then we need to increase your synthroid and recheck in 6 weeks.  Cholesterol up from last year. Need to work on diet and exercise. We may need to consider medication in future.

## 2020-07-20 ENCOUNTER — Ambulatory Visit: Payer: 59 | Admitting: Sports Medicine

## 2020-07-21 ENCOUNTER — Other Ambulatory Visit: Payer: Self-pay | Admitting: Physician Assistant

## 2020-07-21 DIAGNOSIS — E291 Testicular hypofunction: Secondary | ICD-10-CM

## 2020-07-21 NOTE — Telephone Encounter (Signed)
Last written 01/20/2020 #50ml with 5 refills Last labs drawn in May 2022, last appt 06/15/20

## 2020-07-24 ENCOUNTER — Other Ambulatory Visit: Payer: Self-pay | Admitting: Physician Assistant

## 2020-07-24 DIAGNOSIS — F419 Anxiety disorder, unspecified: Secondary | ICD-10-CM

## 2020-07-24 NOTE — Telephone Encounter (Signed)
Last written 01/20/2020 #30 with 5 refills Last appt 06/15/2020

## 2020-08-13 ENCOUNTER — Other Ambulatory Visit: Payer: Self-pay | Admitting: Neurology

## 2020-08-14 ENCOUNTER — Other Ambulatory Visit: Payer: Self-pay | Admitting: Physician Assistant

## 2020-08-14 DIAGNOSIS — E039 Hypothyroidism, unspecified: Secondary | ICD-10-CM

## 2020-10-05 ENCOUNTER — Other Ambulatory Visit: Payer: Self-pay | Admitting: Neurology

## 2020-11-03 ENCOUNTER — Other Ambulatory Visit: Payer: Self-pay | Admitting: Neurology

## 2020-11-08 ENCOUNTER — Other Ambulatory Visit: Payer: Self-pay | Admitting: Physician Assistant

## 2020-11-08 DIAGNOSIS — E039 Hypothyroidism, unspecified: Secondary | ICD-10-CM

## 2020-11-10 ENCOUNTER — Other Ambulatory Visit: Payer: Self-pay | Admitting: Physician Assistant

## 2020-11-10 NOTE — Telephone Encounter (Signed)
Last written 06/24/2020 #10 with 2 refills Last appt 06/15/2020

## 2020-11-12 ENCOUNTER — Other Ambulatory Visit: Payer: Self-pay | Admitting: Neurology

## 2020-12-24 ENCOUNTER — Encounter: Payer: Self-pay | Admitting: Physician Assistant

## 2020-12-24 ENCOUNTER — Other Ambulatory Visit: Payer: Self-pay | Admitting: Physician Assistant

## 2020-12-24 DIAGNOSIS — F902 Attention-deficit hyperactivity disorder, combined type: Secondary | ICD-10-CM

## 2020-12-24 DIAGNOSIS — E291 Testicular hypofunction: Secondary | ICD-10-CM

## 2020-12-24 MED ORDER — LAMOTRIGINE 25 MG PO TABS: 25.0000 mg | ORAL_TABLET | Freq: Every day | ORAL | 0 refills | Status: DC

## 2020-12-24 MED ORDER — LISDEXAMFETAMINE DIMESYLATE 60 MG PO CAPS: 60.0000 mg | ORAL_CAPSULE | ORAL | 0 refills | Status: DC

## 2020-12-24 MED ORDER — TESTOSTERONE CYPIONATE 200 MG/ML IM SOLN: INTRAMUSCULAR | 1 refills | Status: DC

## 2021-01-09 ENCOUNTER — Other Ambulatory Visit: Payer: Self-pay | Admitting: Physician Assistant

## 2021-01-09 DIAGNOSIS — F5101 Primary insomnia: Secondary | ICD-10-CM

## 2021-01-12 ENCOUNTER — Encounter: Payer: Self-pay | Admitting: Physician Assistant

## 2021-02-16 ENCOUNTER — Other Ambulatory Visit: Payer: Self-pay | Admitting: Sports Medicine

## 2021-02-16 ENCOUNTER — Other Ambulatory Visit: Payer: Self-pay | Admitting: Physician Assistant

## 2021-02-16 DIAGNOSIS — E538 Deficiency of other specified B group vitamins: Secondary | ICD-10-CM

## 2021-02-16 DIAGNOSIS — M62838 Other muscle spasm: Secondary | ICD-10-CM

## 2021-02-16 DIAGNOSIS — F5101 Primary insomnia: Secondary | ICD-10-CM

## 2021-02-16 DIAGNOSIS — F419 Anxiety disorder, unspecified: Secondary | ICD-10-CM

## 2021-02-16 DIAGNOSIS — M542 Cervicalgia: Secondary | ICD-10-CM

## 2021-02-17 NOTE — Telephone Encounter (Signed)
If he schedules appt will send one more month

## 2021-02-17 NOTE — Telephone Encounter (Signed)
Ambien last written 01/12/2021 #30 with no refills Cyclobenzaprine 06/18/2020 #90 with 1 refill  Last appt 06/15/2020

## 2021-02-17 NOTE — Telephone Encounter (Signed)
To PCP

## 2021-02-23 ENCOUNTER — Other Ambulatory Visit: Payer: Self-pay

## 2021-02-23 ENCOUNTER — Ambulatory Visit (INDEPENDENT_AMBULATORY_CARE_PROVIDER_SITE_OTHER): Payer: PRIVATE HEALTH INSURANCE | Admitting: Physician Assistant

## 2021-02-23 ENCOUNTER — Encounter: Payer: Self-pay | Admitting: Physician Assistant

## 2021-02-23 VITALS — BP 136/89 | HR 64 | Ht 75.0 in | Wt 276.0 lb

## 2021-02-23 DIAGNOSIS — F902 Attention-deficit hyperactivity disorder, combined type: Secondary | ICD-10-CM | POA: Diagnosis not present

## 2021-02-23 DIAGNOSIS — E039 Hypothyroidism, unspecified: Secondary | ICD-10-CM | POA: Diagnosis not present

## 2021-02-23 DIAGNOSIS — E291 Testicular hypofunction: Secondary | ICD-10-CM

## 2021-02-23 DIAGNOSIS — G40909 Epilepsy, unspecified, not intractable, without status epilepticus: Secondary | ICD-10-CM

## 2021-02-23 DIAGNOSIS — K219 Gastro-esophageal reflux disease without esophagitis: Secondary | ICD-10-CM

## 2021-02-23 DIAGNOSIS — E538 Deficiency of other specified B group vitamins: Secondary | ICD-10-CM

## 2021-02-23 DIAGNOSIS — Z716 Tobacco abuse counseling: Secondary | ICD-10-CM

## 2021-02-23 DIAGNOSIS — F5101 Primary insomnia: Secondary | ICD-10-CM

## 2021-02-23 MED ORDER — NICOTINE 14 MG/24HR TD PT24: 14.0000 mg | MEDICATED_PATCH | Freq: Every day | TRANSDERMAL | 2 refills | Status: DC

## 2021-02-23 MED ORDER — LISDEXAMFETAMINE DIMESYLATE 60 MG PO CAPS: 60.0000 mg | ORAL_CAPSULE | ORAL | 0 refills | Status: DC

## 2021-02-23 MED ORDER — LAMOTRIGINE 25 MG PO TABS: 25.0000 mg | ORAL_TABLET | Freq: Two times a day (BID) | ORAL | 1 refills | Status: DC

## 2021-02-23 MED ORDER — OMEPRAZOLE 40 MG PO CPDR: 40.0000 mg | DELAYED_RELEASE_CAPSULE | Freq: Two times a day (BID) | ORAL | 3 refills | Status: DC

## 2021-02-23 MED ORDER — LAMOTRIGINE 100 MG PO TABS: 100.0000 mg | ORAL_TABLET | Freq: Two times a day (BID) | ORAL | 1 refills | Status: DC

## 2021-02-23 MED ORDER — QUETIAPINE FUMARATE 25 MG PO TABS: ORAL_TABLET | ORAL | 0 refills | Status: DC

## 2021-02-23 NOTE — Progress Notes (Signed)
Subjective:    Patient ID: Wesley Mckay, male    DOB: 09/14/1980, 41 y.o.   MRN: [id number]  HPI Pt is a 41 yo obese male with OSA, hypothyroidism, hypogonadism, GERD, seizures, insomnia, ADHD who presents to the clinic for follow up.   Pt is not sleeping well. On ambien but still does not sleep most nights. Wonders about increase or change in medications.   No seizures since jan 2022. Doing well on lamictal.   No SI/HC. Doing well with focus and vyvanse.   No major concerns or problems just needs refills.  .. 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   Bilateral foot pain 08/16/2012   Bilateral plantar fasciitis 11/14/2014   Inattention 11/17/2014   No energy 11/17/2014   Hyperlipidemia LDL goal <130 11/18/2014   Attention deficit hyperactivity disorder (ADHD), predominantly inattentive type 12/19/2014   Major depressive disorder, recurrent episode, moderate ([place]) 12/19/2014   B12 deficiency 09/15/2015   Sinus bradycardia 09/15/2015   Seizure disorder ([place]) 02/29/2016   Erectile dysfunction 02/29/2016   Vitamin D deficiency 04/11/2016   Anxiety 11/17/2016   Muscle spasm 07/14/2017   Neck pain 07/14/2017   Attention deficit hyperactivity disorder (ADHD), combined type 07/14/2017   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   Nonspecific findings on examination of blood 09/18/2012   Sleeping difficulty 09/18/2012   Non-restorative sleep 10/30/2018   Snoring 10/30/2018   Class 1 obesity due to excess calories without serious comorbidity with body mass index (BMI) of 34.0 to 34.9 in adult 10/30/2018   Suspicious nevus 11/02/2018   Elevated blood pressure reading 11/02/2018   OSA (obstructive sleep apnea) 02/22/2019   DDD (degenerative disc disease), cervical 03/16/2020   Spinal hemangioma  03/16/2020   Chronic right shoulder pain 06/08/2020   Mass of joint of right shoulder 06/10/2020   Folate deficiency 06/24/2020   Dyslipidemia (high LDL; low HDL) 06/24/2020   Resolved Ambulatory Problems    Diagnosis Date Noted   FOOT PAIN, RIGHT 11/29/2007   Past Medical History:  Diagnosis Date   GERD (gastroesophageal reflux disease)    History of dislocation of shoulder    Insomnia    Obesity    Seizures ([place]) 03/2007   Thyroid disease      Review of Systems    See HPI.  Objective:   Physical Exam Vitals reviewed.  Constitutional:      Appearance: Normal appearance. He is obese.  HENT:     Head: Normocephalic.  Neck:     Vascular: No carotid bruit.  Cardiovascular:     Rate and Rhythm: Normal rate and regular rhythm.     Pulses: Normal pulses.  Pulmonary:     Effort: Pulmonary effort is normal.     Breath sounds: Normal breath sounds.  Musculoskeletal:     Right lower leg: No edema.     Left lower leg: No edema.  Neurological:     General: No focal deficit present.     Mental Status: He is alert and oriented to person, place, and time.  Psychiatric:        Mood and Affect: Mood normal.  .. Depression screen [facility] 2/9 02/23/2021 06/15/2020 07/24/2019 02/22/2019 10/30/2018  Decreased Interest 0 0 0 0 0  Down, Depressed, Hopeless 0 0 0 0 1  PHQ - 2 Score  0 0 0 0 1  Altered sleeping 3 3 2  - 3  Tired, decreased energy 1 1 0 - 1  Change in appetite 1 - 0 - 0  Feeling bad or failure about yourself  1 1 0 - 1  Trouble concentrating 0 0 0 - 0  Moving slowly or fidgety/restless 0 1 0 - 0  Suicidal thoughts 0 0 0 - 0  PHQ-9 Score 6 6 2  - 6  Difficult doing work/chores Not difficult at all - Somewhat difficult - Somewhat difficult   .[name] GAD 7 : Generalized Anxiety Score 02/23/2021 06/15/2020 07/24/2019 02/22/2019  Nervous, Anxious, on Edge 0 0 0 0  Control/stop worrying 0 1 0 0  Worry too much - different things 1 1 0 0  Trouble relaxing 1 1 1 1   Restless 0 1 0 0  Easily  annoyed or irritable 0 2 0 0  Afraid - awful might happen 0 0 0 0  Total GAD 7 Score 2 6 1 1   Anxiety Difficulty Not difficult at all Not difficult at all Not difficult at all Not difficult at all          Assessment & Plan:  Marland KitchenMarland KitchenRoey was seen today for follow-up.  Diagnoses and all orders for this visit:  Attention deficit hyperactivity disorder (ADHD), combined type -     COMPLETE METABOLIC PANEL WITH GFR -     lisdexamfetamine (VYVANSE) 60 MG capsule; Take 1 capsule (60 mg total) by mouth every morning. -     lisdexamfetamine (VYVANSE) 60 MG capsule; Take 1 capsule (60 mg total) by mouth every morning. -     lisdexamfetamine (VYVANSE) 60 MG capsule; Take 1 capsule (60 mg total) by mouth every morning.  B12 deficiency -     B12 and Folate Panel  Hypothyroidism, unspecified type -     COMPLETE METABOLIC PANEL WITH GFR -     TSH  Low folate -     COMPLETE METABOLIC PANEL WITH GFR -     B12 and Folate Panel  Primary insomnia -     COMPLETE METABOLIC PANEL WITH GFR -     QUEtiapine (SEROQUEL) 25 MG tablet; Take one to two tablets at bedtime for sleep.  Encounter for smoking cessation counseling -     nicotine (NICODERM CQ) 14 mg/24hr patch; Place 1 patch (14 mg total) onto the skin daily.  Seizure disorder (HCC) -     lamoTRIgine (LAMICTAL) 25 MG tablet; Take 1 tablet (25 mg total) by mouth 2 (two) times daily. -     lamoTRIgine (LAMICTAL) 100 MG tablet; Take 1 tablet (100 mg total) by mouth 2 (two) times daily.  Gastroesophageal reflux disease, unspecified whether esophagitis present -     omeprazole (PRILOSEC) 40 MG capsule; Take 1 capsule (40 mg total) by mouth 2 (two) times daily.  Male hypogonadism -     Testosterone Total,Free,Bio, Males   Needs labs.  Refilled medications including vyvanse.  Stop ambien and start seroquel. Follow up in 4 weeks.  Discussed smoking cessation. Started patches. Due to seizures pt is not a candidate for wellbutrin. HO given.

## 2021-02-24 ENCOUNTER — Other Ambulatory Visit: Payer: Self-pay | Admitting: Physician Assistant

## 2021-02-24 DIAGNOSIS — E039 Hypothyroidism, unspecified: Secondary | ICD-10-CM

## 2021-02-24 LAB — COMPLETE METABOLIC PANEL WITH GFR
AG Ratio: 2 (calc) (ref 1.0–2.5)
ALT: 8 U/L — ABNORMAL LOW (ref 9–46)
AST: 13 U/L (ref 10–40)
Albumin: 4.5 g/dL (ref 3.6–5.1)
Alkaline phosphatase (APISO): 100 U/L (ref 36–130)
BUN: 8 mg/dL (ref 7–25)
CO2: 28 mmol/L (ref 20–32)
Calcium: 9.5 mg/dL (ref 8.6–10.3)
Chloride: 103 mmol/L (ref 98–110)
Creat: 1.14 mg/dL (ref 0.60–1.29)
Globulin: 2.2 g/dL (calc) (ref 1.9–3.7)
Glucose, Bld: 87 mg/dL (ref 65–99)
Potassium: 4.3 mmol/L (ref 3.5–5.3)
Sodium: 139 mmol/L (ref 135–146)
Total Bilirubin: 0.4 mg/dL (ref 0.2–1.2)
Total Protein: 6.7 g/dL (ref 6.1–8.1)
eGFR: 82 mL/min/{1.73_m2} (ref >=60)

## 2021-02-24 LAB — TESTOSTERONE TOTAL,FREE,BIO, MALES

## 2021-02-24 LAB — TSH: TSH: 13.53 mIU/L — ABNORMAL HIGH (ref 0.40–4.50)

## 2021-02-24 LAB — B12 AND FOLATE PANEL
Folate: 7 ng/mL
Vitamin B-12: 570 pg/mL (ref 200–1100)

## 2021-02-24 MED ORDER — LEVOTHYROXINE SODIUM 175 MCG PO TABS: 175.0000 ug | ORAL_TABLET | Freq: Every day | ORAL | 0 refills | Status: DC

## 2021-02-24 NOTE — Progress Notes (Signed)
[place],   B12 looks great. Folate looks much better.  Thyroid is not to goal and showing that your medication needs to be increased. Sent 133mcg to pharmacy to take first thing in am without food. Recheck in 3 months.   JJ, can we add testosterone?

## 2021-03-03 ENCOUNTER — Other Ambulatory Visit: Payer: Self-pay | Admitting: Physician Assistant

## 2021-03-11 ENCOUNTER — Other Ambulatory Visit: Payer: Self-pay | Admitting: Physician Assistant

## 2021-03-11 DIAGNOSIS — G40909 Epilepsy, unspecified, not intractable, without status epilepticus: Secondary | ICD-10-CM

## 2021-03-17 ENCOUNTER — Other Ambulatory Visit: Payer: Self-pay | Admitting: Physician Assistant

## 2021-03-17 DIAGNOSIS — F5101 Primary insomnia: Secondary | ICD-10-CM

## 2021-03-23 ENCOUNTER — Other Ambulatory Visit: Payer: Self-pay | Admitting: Physician Assistant

## 2021-03-23 DIAGNOSIS — F5101 Primary insomnia: Secondary | ICD-10-CM

## 2021-03-24 ENCOUNTER — Other Ambulatory Visit: Payer: Self-pay | Admitting: Physician Assistant

## 2021-03-24 DIAGNOSIS — F5101 Primary insomnia: Secondary | ICD-10-CM

## 2021-03-24 MED ORDER — QUETIAPINE FUMARATE 25 MG PO TABS: ORAL_TABLET | ORAL | 0 refills | Status: DC

## 2021-04-20 ENCOUNTER — Other Ambulatory Visit: Payer: Self-pay | Admitting: Physician Assistant

## 2021-04-20 DIAGNOSIS — F5101 Primary insomnia: Secondary | ICD-10-CM

## 2021-04-27 ENCOUNTER — Other Ambulatory Visit: Payer: Self-pay | Admitting: Physician Assistant

## 2021-04-27 DIAGNOSIS — F5101 Primary insomnia: Secondary | ICD-10-CM

## 2021-05-12 ENCOUNTER — Other Ambulatory Visit: Payer: Self-pay | Admitting: Physician Assistant

## 2021-05-21 ENCOUNTER — Other Ambulatory Visit: Payer: Self-pay | Admitting: Physician Assistant

## 2021-05-21 DIAGNOSIS — F5101 Primary insomnia: Secondary | ICD-10-CM

## 2021-06-01 ENCOUNTER — Other Ambulatory Visit: Payer: Self-pay | Admitting: Physician Assistant

## 2021-06-01 DIAGNOSIS — F5101 Primary insomnia: Secondary | ICD-10-CM

## 2021-06-03 ENCOUNTER — Other Ambulatory Visit: Payer: Self-pay | Admitting: Physician Assistant

## 2021-06-03 DIAGNOSIS — E291 Testicular hypofunction: Secondary | ICD-10-CM

## 2021-06-03 NOTE — Telephone Encounter (Signed)
Last written 12/24/2020 62m with 1 refill ?Last appt and labs 02/23/2021  ? ?

## 2021-06-11 ENCOUNTER — Encounter: Payer: Self-pay | Admitting: Physician Assistant

## 2021-06-11 DIAGNOSIS — E291 Testicular hypofunction: Secondary | ICD-10-CM

## 2021-06-11 NOTE — Telephone Encounter (Signed)
Please advise since this is a scheduled drug.  ?

## 2021-06-14 MED ORDER — TESTOSTERONE CYPIONATE 200 MG/ML IM SOLN: INTRAMUSCULAR | 0 refills | Status: DC

## 2021-06-15 NOTE — Addendum Note (Signed)
Addended by: Narda Rutherford on: 06/15/2021 01:30 PM ? ? Modules accepted: Orders ? ?

## 2021-06-17 ENCOUNTER — Ambulatory Visit (INDEPENDENT_AMBULATORY_CARE_PROVIDER_SITE_OTHER): Payer: PRIVATE HEALTH INSURANCE | Admitting: Family Medicine

## 2021-06-17 VITALS — BP 136/86 | HR 61

## 2021-06-17 DIAGNOSIS — E291 Testicular hypofunction: Secondary | ICD-10-CM

## 2021-06-17 MED ORDER — TESTOSTERONE CYPIONATE 200 MG/ML IM SOLN
300.0000 mg | Freq: Once | INTRAMUSCULAR | Status: AC
  Administered 2021-06-17: 300 mg via INTRAMUSCULAR

## 2021-06-17 NOTE — Progress Notes (Signed)
? ?  Established Patient Office Visit ? ?Subjective   ?Patient ID: Wesley Mckay, male    DOB: 07/21/1980  Age: 41 y.o. MRN: [id number] ? ?Chief Complaint  ?Patient presents with  ? Hypogonadism  ? ? ?HPI ? ?[name] is here for a testosterone injection. Denies chest pain, shortness of breath, headaches or mood changes.  ? ?ROS ? ?  ?Objective:  ?  ? ?BP 136/86   Pulse 61   SpO2 100%  ? ? ?Physical Exam ? ? ?No results found for any visits on 06/17/21. ? ? ? ?The 10-year ASCVD risk score (Arnett DK, et al., 2019) is: 6.3% ? ?  ?Assessment & Plan:  ?Hypogonadism - Patient tolerated injection well without complications. Patient advised to schedule next injection 14 days from today.   ? ?Problem List Items Addressed This Visit   ? ? Male hypogonadism - Primary  ? ? ?Return in about 2 weeks (around 07/01/2021) for testosterone injection. .  ? ? ?Lavell Luster, [place] ? ?

## 2021-06-17 NOTE — Progress Notes (Signed)
Agree with above.  Needs CBC and PSA drawn at next visit. ?

## 2021-06-21 ENCOUNTER — Ambulatory Visit (INDEPENDENT_AMBULATORY_CARE_PROVIDER_SITE_OTHER): Payer: PRIVATE HEALTH INSURANCE | Admitting: Physician Assistant

## 2021-06-21 ENCOUNTER — Encounter: Payer: Self-pay | Admitting: Physician Assistant

## 2021-06-21 VITALS — BP 147/96 | HR 67 | Ht 75.0 in | Wt 270.0 lb

## 2021-06-21 DIAGNOSIS — F902 Attention-deficit hyperactivity disorder, combined type: Secondary | ICD-10-CM | POA: Diagnosis not present

## 2021-06-21 DIAGNOSIS — G4733 Obstructive sleep apnea (adult) (pediatric): Secondary | ICD-10-CM

## 2021-06-21 DIAGNOSIS — E291 Testicular hypofunction: Secondary | ICD-10-CM | POA: Diagnosis not present

## 2021-06-21 DIAGNOSIS — E039 Hypothyroidism, unspecified: Secondary | ICD-10-CM | POA: Diagnosis not present

## 2021-06-21 DIAGNOSIS — F5101 Primary insomnia: Secondary | ICD-10-CM | POA: Diagnosis not present

## 2021-06-21 DIAGNOSIS — N529 Male erectile dysfunction, unspecified: Secondary | ICD-10-CM

## 2021-06-21 MED ORDER — LEVOTHYROXINE SODIUM 300 MCG PO TABS: 300.0000 ug | ORAL_TABLET | Freq: Every day | ORAL | 1 refills | Status: DC

## 2021-06-21 MED ORDER — TADALAFIL 20 MG PO TABS: 10.0000 mg | ORAL_TABLET | ORAL | 3 refills | Status: DC

## 2021-06-21 MED ORDER — LISDEXAMFETAMINE DIMESYLATE 60 MG PO CAPS: 60.0000 mg | ORAL_CAPSULE | ORAL | 0 refills | Status: DC

## 2021-06-21 MED ORDER — QUETIAPINE FUMARATE 25 MG PO TABS: ORAL_TABLET | ORAL | 1 refills | Status: DC

## 2021-06-21 NOTE — Patient Instructions (Addendum)
Start using CPAP ?Start thyroid medication in morning ?Get labs in 4-6 weeks.  ?

## 2021-06-21 NOTE — Progress Notes (Signed)
? ?Established Patient Office Visit ? ?Subjective   ?Patient ID: Wesley Mckay, male    DOB: 1980-11-10  Age: 41 y.o. MRN: [id number] ? ?Chief Complaint  ?Patient presents with  ? Follow-up  ? ? ?HPI ?Pt is a 41 yo obese male with HTN, hypogonadism, hypothyroidism, ADHD, OSA who presents to the clinic for medication refills and follow ups.  ? ?OSA- not using CPAP. Knows he needs too.  ? ?No CP, palpitations, headaches, or vision changes.  ? ?He is very tired. No energy or motavation.  ? ?Continues on vyvanse. No concerns with this medication or dose.  ? ?Lost testosterone and been issues staying on course with every 2 weeks. He did schedule a shot just a few days ago and feeling a little better.  ? ?Taking levothyroxine at bedtime.  ? ?.. ?Active Ambulatory Problems  ?  Diagnosis Date Noted  ? Hypothyroidism 08/01/2007  ? GERD 08/01/2007  ? SEIZURE DISORDER 08/01/2007  ? Primary insomnia 08/01/2007  ? Anxiety disorder 10/04/2010  ? Male hypogonadism 08/29/2011  ? Bilateral foot pain 08/16/2012  ? Bilateral plantar fasciitis 11/14/2014  ? Inattention 11/17/2014  ? No energy 11/17/2014  ? Hyperlipidemia LDL goal <130 11/18/2014  ? Attention deficit hyperactivity disorder (ADHD), predominantly inattentive type 12/19/2014  ? Major depressive disorder, recurrent episode, moderate ([place]) 12/19/2014  ? B12 deficiency 09/15/2015  ? Sinus bradycardia 09/15/2015  ? Seizure disorder ([place]) 02/29/2016  ? Erectile dysfunction 02/29/2016  ? Vitamin D deficiency 04/11/2016  ? Anxiety 11/17/2016  ? Muscle spasm 07/14/2017  ? Neck pain 07/14/2017  ? Attention deficit hyperactivity disorder (ADHD), combined type 07/14/2017  ? 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 Nonspecific findings on examination of blood 09/18/2012  ? Sleeping difficulty 09/18/2012  ? Non-restorative sleep 10/30/2018  ? Snoring 10/30/2018  ? Class 1 obesity due to excess calories without serious comorbidity with body mass index (BMI) of 34.0 to 34.9 in  adult 10/30/2018  ? Suspicious nevus 11/02/2018  ? Elevated blood pressure reading 11/02/2018  ? OSA (obstructive sleep apnea) 02/22/2019  ? DDD (degenerative disc disease), cervical 03/16/2020  ? Spinal hemangioma 03/16/2020  ? Chronic right shoulder pain 06/08/2020  ? Mass of joint of right shoulder 06/10/2020  ? Folate deficiency 06/24/2020  ? Dyslipidemia (high LDL; low HDL) 06/24/2020  ? ?Resolved Ambulatory Problems  ?  Diagnosis Date Noted  ? FOOT PAIN, RIGHT 11/29/2007  ? ?Past Medical History:  ?Diagnosis Date  ? GERD (gastroesophageal reflux disease)   ? History of dislocation of shoulder   ? Insomnia   ? Obesity   ? Seizures ([place]) 03/2007  ? Thyroid disease   ? ? ? ?Review of Systems  ?All other systems reviewed and are negative. ? ?  ?Objective:  ?  ? ?BP (!) 147/96   Pulse 67   Ht '6\' 3"'$  (1.905 m)   Wt 270 lb (122.5 kg)   SpO2 99%   BMI 33.75 kg/m?  ?BP Readings from Last 3 Encounters:  ?06/21/21 (!) 147/96  ?06/17/21 136/86  ?02/23/21 136/89  ? ?Wt Readings from Last 3 Encounters:  ?06/21/21 270 lb (122.5 kg)  ?02/23/21 276 lb (125.2 kg)  ?06/15/20 265 lb (120.2 kg)  ? ?  ? ?Physical Exam ?Vitals reviewed.  ?Constitutional:   ?   Appearance: Normal appearance. He is obese.  ?HENT:  ?   Head: Normocephalic.  ?Neck:  ?   Vascular: No carotid bruit.  ?Cardiovascular:  ?   Rate and Rhythm: Normal  rate and regular rhythm.  ?   Pulses: Normal pulses.  ?   Heart sounds: Normal heart sounds.  ?Pulmonary:  ?   Effort: Pulmonary effort is normal.  ?   Breath sounds: Normal breath sounds.  ?Musculoskeletal:  ?   Right lower leg: No edema.  ?   Left lower leg: No edema.  ?Lymphadenopathy:  ?   Cervical: No cervical adenopathy.  ?Neurological:  ?   General: No focal deficit present.  ?   Mental Status: He is alert and oriented to person, place, and time.  ?Psychiatric:     ?   Mood and Affect: Mood normal.  ? ? ? ?  ?Assessment & Plan:  ?..Wesley Mckay was seen today for follow-up. ? ?Diagnoses and all orders for this  visit: ? ?Hypothyroidism, unspecified type ?-     levothyroxine (SYNTHROID) 300 MCG tablet; Take 1 tablet (300 mcg total) by mouth daily before breakfast. ?-     TSH ?-     COMPLETE METABOLIC PANEL WITH GFR ? ?Attention deficit hyperactivity disorder (ADHD), combined type ?-     lisdexamfetamine (VYVANSE) 60 MG capsule; Take 1 capsule (60 mg total) by mouth every morning. ?-     lisdexamfetamine (VYVANSE) 60 MG capsule; Take 1 capsule (60 mg total) by mouth every morning. ?-     lisdexamfetamine (VYVANSE) 60 MG capsule; Take 1 capsule (60 mg total) by mouth every morning. ? ?Primary insomnia ?-     QUEtiapine (SEROQUEL) 25 MG tablet; TAKE 1 TO 2 TABLETS BY MOUTH AT BEDTIME FOR SLEEP. ? ?Male hypogonadism ?-     Testosterone Total,Free,Bio, Males ?-     COMPLETE METABOLIC PANEL WITH GFR ? ?OSA (obstructive sleep apnea) ? ?Erectile dysfunction, unspecified erectile dysfunction type ?-     tadalafil (CIALIS) 20 MG tablet; Take 0.5-1 tablets (10-20 mg total) by mouth every other day. ? ? ?Encouraged to start using CPAP to help with BP, energy, HTN ?Start taking levothyroxine in morning  ?Recheck in 4-6 weeks ?Continue testosterone every 2 weeks ?Seroquel for sleep refilled ?Vyvanse refilled for 3 months.  ?Get labs ?Follow up in 3 months.  ?Iran Planas, PA-C ? ?

## 2021-06-23 ENCOUNTER — Other Ambulatory Visit: Payer: Self-pay | Admitting: Physician Assistant

## 2021-06-23 DIAGNOSIS — G40909 Epilepsy, unspecified, not intractable, without status epilepticus: Secondary | ICD-10-CM

## 2021-06-25 ENCOUNTER — Encounter: Payer: Self-pay | Admitting: Physician Assistant

## 2021-07-01 ENCOUNTER — Ambulatory Visit: Payer: PRIVATE HEALTH INSURANCE

## 2021-07-30 ENCOUNTER — Other Ambulatory Visit: Payer: Self-pay | Admitting: Physician Assistant

## 2021-07-30 DIAGNOSIS — F419 Anxiety disorder, unspecified: Secondary | ICD-10-CM

## 2021-08-02 NOTE — Telephone Encounter (Signed)
Last appt 02/17/2021 #30 no refills Last appt 06/21/2021

## 2021-08-06 ENCOUNTER — Other Ambulatory Visit: Payer: Self-pay | Admitting: Physician Assistant

## 2021-08-06 DIAGNOSIS — E291 Testicular hypofunction: Secondary | ICD-10-CM

## 2021-08-19 ENCOUNTER — Other Ambulatory Visit: Payer: Self-pay | Admitting: Physician Assistant

## 2021-08-19 DIAGNOSIS — G40909 Epilepsy, unspecified, not intractable, without status epilepticus: Secondary | ICD-10-CM

## 2021-08-27 ENCOUNTER — Other Ambulatory Visit: Payer: Self-pay | Admitting: Physician Assistant

## 2021-08-27 DIAGNOSIS — N529 Male erectile dysfunction, unspecified: Secondary | ICD-10-CM

## 2021-09-21 ENCOUNTER — Encounter: Payer: Self-pay | Admitting: Physician Assistant

## 2021-09-21 ENCOUNTER — Ambulatory Visit (INDEPENDENT_AMBULATORY_CARE_PROVIDER_SITE_OTHER): Payer: PRIVATE HEALTH INSURANCE | Admitting: Physician Assistant

## 2021-09-21 VITALS — BP 142/85 | HR 88 | Ht 75.0 in | Wt 270.0 lb

## 2021-09-21 DIAGNOSIS — Z13220 Encounter for screening for lipoid disorders: Secondary | ICD-10-CM

## 2021-09-21 DIAGNOSIS — E039 Hypothyroidism, unspecified: Secondary | ICD-10-CM

## 2021-09-21 DIAGNOSIS — E291 Testicular hypofunction: Secondary | ICD-10-CM | POA: Diagnosis not present

## 2021-09-21 DIAGNOSIS — E538 Deficiency of other specified B group vitamins: Secondary | ICD-10-CM

## 2021-09-21 DIAGNOSIS — E559 Vitamin D deficiency, unspecified: Secondary | ICD-10-CM

## 2021-09-21 DIAGNOSIS — F902 Attention-deficit hyperactivity disorder, combined type: Secondary | ICD-10-CM

## 2021-09-21 DIAGNOSIS — R4586 Emotional lability: Secondary | ICD-10-CM

## 2021-09-21 DIAGNOSIS — Z131 Encounter for screening for diabetes mellitus: Secondary | ICD-10-CM

## 2021-09-21 MED ORDER — LISDEXAMFETAMINE DIMESYLATE 30 MG PO CAPS: ORAL_CAPSULE | ORAL | 0 refills | Status: DC

## 2021-09-21 NOTE — Progress Notes (Signed)
Established Patient Office Visit  Subjective   Patient ID: Wesley Mckay, male    DOB: August 30, 1980  Age: 42 y.o. MRN: [id number]  Chief Complaint  Patient presents with   Follow-up    HPI Pt is a 41 yo obese male with hypogonadism, hypothyroidism, ADHD, OSA, Seizure disorder who needs refills.   Vyvanse is on back order for the last month and he has noticed since he does not have it his mood is worse and focus is less. He is struggling without it and does not feel good. He wants to know what to do. His energy is also much less than being on medication. He is struggling to complete task.   He is taking his testosterone injections weekly and notice a big improvement taking this way.   .. 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   Bilateral foot pain 08/16/2012   Bilateral plantar fasciitis 11/14/2014   Inattention 11/17/2014   No energy 11/17/2014   Hyperlipidemia LDL goal <130 11/18/2014   Attention deficit hyperactivity disorder (ADHD), predominantly inattentive type 12/19/2014   Major depressive disorder, recurrent episode, moderate ([place]) 12/19/2014   B12 deficiency 09/15/2015   Sinus bradycardia 09/15/2015   Seizure disorder ([place]) 02/29/2016   Erectile dysfunction 02/29/2016   Vitamin D deficiency 04/11/2016   Anxiety 11/17/2016   Muscle spasm 07/14/2017   Neck pain 07/14/2017   Attention deficit hyperactivity disorder (ADHD), combined type 07/14/2017   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   Nonspecific findings on examination of blood 09/18/2012   Sleeping difficulty 09/18/2012   Non-restorative sleep 10/30/2018   Snoring 10/30/2018   Class 1 obesity due to excess calories without serious comorbidity with body mass index (BMI) of 34.0 to 34.9 in adult 10/30/2018   Suspicious nevus  11/02/2018   Elevated blood pressure reading 11/02/2018   OSA (obstructive sleep apnea) 02/22/2019   DDD (degenerative disc disease), cervical 03/16/2020   Spinal hemangioma 03/16/2020   Chronic right shoulder pain 06/08/2020   Mass of joint of right shoulder 06/10/2020   Folate deficiency 06/24/2020   Dyslipidemia (high LDL; low HDL) 06/24/2020   Mood changes 09/21/2021   Resolved Ambulatory Problems    Diagnosis Date Noted   FOOT PAIN, RIGHT 11/29/2007   Past Medical History:  Diagnosis Date   GERD (gastroesophageal reflux disease)    History of dislocation of shoulder    Insomnia    Obesity    Seizures ([place]) 03/2007   Thyroid disease      ROS See HPI.    Objective:     BP (!) 142/85   Pulse 88   Ht '6\' 3"'$  (1.905 m)   Wt 270 lb (122.5 kg)   SpO2 99%   BMI 33.75 kg/m  BP Readings from Last 3 Encounters:  09/21/21 (!) 142/85  06/21/21 (!) 147/96  06/17/21 136/86   Wt Readings from Last 3 Encounters:  09/21/21 270 lb (122.5 kg)  06/21/21 270 lb (122.5 kg)  02/23/21 276 lb (125.2 kg)      Physical Exam Constitutional:      Appearance: Normal appearance. He is obese.  HENT:     Head: Normocephalic.  Cardiovascular:     Rate and Rhythm: Normal rate and regular rhythm.     Pulses: Normal pulses.     Heart sounds: Normal heart  sounds.  Pulmonary:     Effort: Pulmonary effort is normal.     Breath sounds: Normal breath sounds.  Neurological:     General: No focal deficit present.     Mental Status: He is alert and oriented to person, place, and time.  Psychiatric:     Comments: Flat affect        Assessment & Plan:  Marland KitchenMarland KitchenSeraphim was seen today for follow-up.  Diagnoses and all orders for this visit:  Attention deficit hyperactivity disorder (ADHD), combined type -     lisdexamfetamine (VYVANSE) 30 MG capsule; Take 2 tablets daily.  Hypothyroidism, unspecified type -     TSH  Male hypogonadism -     Testosterone Total,Free,Bio, Males -      PSA  Low folate -     CBC with Differential/Platelet -     Folate  Vitamin D insufficiency -     CBC with Differential/Platelet -     VITAMIN D 25 Hydroxy (Vit-D Deficiency, Fractures)  Screening for lipid disorders -     Lipid Panel w/reflex Direct LDL  Screening for diabetes mellitus -     COMPLETE METABOLIC PANEL WITH GFR  Mood changes  Needs labs for follow up.  Vyvanse '30mg'$  for 2 daily sent to pharmacy as word is they are out of higher dosages Once back on this can determine if mood medication need to be changed Recheck testosterone to make sure in normal range Follow up on vitamin D and folate Follow up in 3 months.    Iran Planas, PA-C

## 2021-09-22 LAB — COMPLETE METABOLIC PANEL WITH GFR
AG Ratio: 1.7 (calc) (ref 1.0–2.5)
ALT: 15 U/L (ref 9–46)
AST: 13 U/L (ref 10–40)
Albumin: 4.5 g/dL (ref 3.6–5.1)
Alkaline phosphatase (APISO): 112 U/L (ref 36–130)
BUN: 7 mg/dL (ref 7–25)
CO2: 29 mmol/L (ref 20–32)
Calcium: 9.7 mg/dL (ref 8.6–10.3)
Chloride: 105 mmol/L (ref 98–110)
Creat: 1.09 mg/dL (ref 0.60–1.29)
Globulin: 2.6 g/dL (calc) (ref 1.9–3.7)
Glucose, Bld: 74 mg/dL (ref 65–99)
Potassium: 5 mmol/L (ref 3.5–5.3)
Sodium: 140 mmol/L (ref 135–146)
Total Bilirubin: 0.5 mg/dL (ref 0.2–1.2)
Total Protein: 7.1 g/dL (ref 6.1–8.1)
eGFR: 86 mL/min/{1.73_m2} (ref >=60)

## 2021-09-22 LAB — CBC WITH DIFFERENTIAL/PLATELET
Absolute Monocytes: 591 cells/uL (ref 200–950)
Basophils Absolute: 81 cells/uL (ref 0–200)
Basophils Relative: 1 %
Eosinophils Absolute: 170 cells/uL (ref 15–500)
Eosinophils Relative: 2.1 %
HCT: 48.6 % (ref 38.5–50.0)
Hemoglobin: 16.5 g/dL (ref 13.2–17.1)
Lymphs Abs: 2049 cells/uL (ref 850–3900)
MCH: 29.5 pg (ref 27.0–33.0)
MCHC: 34 g/dL (ref 32.0–36.0)
MCV: 86.8 fL (ref 80.0–100.0)
MPV: 9.5 fL (ref 7.5–12.5)
Monocytes Relative: 7.3 %
Neutro Abs: 5208 cells/uL (ref 1500–7800)
Neutrophils Relative %: 64.3 %
Platelets: 274 10*3/uL (ref 140–400)
RBC: 5.6 10*6/uL (ref 4.20–5.80)
RDW: 13.1 % (ref 11.0–15.0)
Total Lymphocyte: 25.3 %
WBC: 8.1 10*3/uL (ref 3.8–10.8)

## 2021-09-22 LAB — TESTOSTERONE TOTAL,FREE,BIO, MALES
Albumin: 4.5 g/dL (ref 3.6–5.1)
Sex Hormone Binding: 27 nmol/L (ref 10–50)
Testosterone, Bioavailable: 92 ng/dL — ABNORMAL LOW (ref 110.0–575.0)
Testosterone, Free: 44.7 pg/mL — ABNORMAL LOW (ref 46.0–224.0)
Testosterone: 301 ng/dL (ref 250–827)

## 2021-09-22 LAB — LIPID PANEL W/REFLEX DIRECT LDL
Cholesterol: 185 mg/dL (ref <200)
HDL: 40 mg/dL (ref >=40)
LDL Cholesterol (Calc): 123 mg/dL (calc) — ABNORMAL HIGH
Non-HDL Cholesterol (Calc): 145 mg/dL (calc) — ABNORMAL HIGH (ref <130)
Total CHOL/HDL Ratio: 4.6 (calc) (ref <5.0)
Triglycerides: 110 mg/dL (ref <150)

## 2021-09-22 LAB — VITAMIN D 25 HYDROXY (VIT D DEFICIENCY, FRACTURES): Vit D, 25-Hydroxy: 36 ng/mL (ref 30–100)

## 2021-09-22 LAB — FOLATE: Folate: 3.6 ng/mL — ABNORMAL LOW

## 2021-09-22 LAB — PSA: PSA: 0.51 ng/mL (ref <=4.00)

## 2021-09-22 LAB — TSH: TSH: 1.21 mIU/L (ref 0.40–4.50)

## 2021-09-22 NOTE — Progress Notes (Signed)
Wesley Mckay,   PSA looks good.  Kidney, liver, glucose looks great.  Thyroid perfect.  Hemoglobin normal. Vitamin D improving.  Folate still VERY low. Need to take folic acid daily.  Testosterone is still low. How many mg are you taking weekly?

## 2021-09-24 ENCOUNTER — Ambulatory Visit (INDEPENDENT_AMBULATORY_CARE_PROVIDER_SITE_OTHER): Payer: PRIVATE HEALTH INSURANCE | Admitting: Sports Medicine

## 2021-09-24 ENCOUNTER — Ambulatory Visit (INDEPENDENT_AMBULATORY_CARE_PROVIDER_SITE_OTHER): Payer: PRIVATE HEALTH INSURANCE

## 2021-09-24 DIAGNOSIS — M722 Plantar fascial fibromatosis: Secondary | ICD-10-CM | POA: Diagnosis not present

## 2021-09-24 NOTE — Progress Notes (Signed)
    Procedures performed today:    Procedure: Real-time Ultrasound Guided injection of the left plantar fascia origin Device: Samsung HS60  Verbal informed consent obtained.  Time-out conducted.  Noted no overlying erythema, induration, or other signs of local infection.  Skin prepped in a sterile fashion.  Local anesthesia: Topical Ethyl chloride.  With sterile technique and under real time ultrasound guidance: Noted thickened plantar fascia with significant calcific spurring, 1 cc Kenalog 40, 1 cc lidocaine, 1 cc bupivacaine injected easily Completed without difficulty  Advised to call if fevers/chills, erythema, induration, drainage, or persistent bleeding.  Images permanently stored and available for review in PACS.  Impression: Technically successful ultrasound guided injection.  Procedure: Real-time Ultrasound Guided injection of the right plantar fascia origin Device: Samsung HS60  Verbal informed consent obtained.  Time-out conducted.  Noted no overlying erythema, induration, or other signs of local infection.  Skin prepped in a sterile fashion.  Local anesthesia: Topical Ethyl chloride.  With sterile technique and under real time ultrasound guidance: Noted thickened plantar fascia, 1 cc Kenalog 40, 1 cc lidocaine, 1 cc bupivacaine injected easily Completed without difficulty  Advised to call if fevers/chills, erythema, induration, drainage, or persistent bleeding.  Images permanently stored and available for review in PACS.  Impression: Technically successful ultrasound guided injection.  Independent interpretation of notes and tests performed by another provider:   None.  Brief History, Exam, Impression, and Recommendations:    Bilateral plantar fasciitis Recurrence of pain, last injected 2014, bilateral plantar fascia injections today per patient request. Restart home conditioning, referral for custom molded orthotics. Return in 6  weeks.    ____________________________________________ Gwen Her. Dianah Field, M.D., ABFM., CAQSM., AME. Primary Care and Sports Medicine  [facility]  Adjunct Professor of [place] of [facility] of Medicine  Risk manager

## 2021-09-24 NOTE — Assessment & Plan Note (Addendum)
Recurrence of pain, last injected 2014, bilateral plantar fascia injections today per patient request. Restart home conditioning, referral for custom molded orthotics. Return in 6 weeks.

## 2021-09-28 ENCOUNTER — Encounter: Payer: Self-pay | Admitting: Family Medicine

## 2021-09-28 ENCOUNTER — Ambulatory Visit (INDEPENDENT_AMBULATORY_CARE_PROVIDER_SITE_OTHER): Payer: PRIVATE HEALTH INSURANCE | Admitting: Family Medicine

## 2021-09-28 DIAGNOSIS — M722 Plantar fascial fibromatosis: Secondary | ICD-10-CM

## 2021-09-28 NOTE — Assessment & Plan Note (Signed)
Acute on chronic in nature.  Has more medial translation on the left compared to the right. -Counseled on home exercise therapy and supportive care. -Orthotics -Could consider adding scaphoid pad on the left

## 2021-09-28 NOTE — Progress Notes (Signed)
  Wesley Mckay - 41 y.o. male MRN [id number]  Date of birth: 09/26/1980  SUBJECTIVE:  Including CC & ROS.  No chief complaint on file.   Wesley Mckay is a 41 y.o. male that is presenting with acute on chronic bilateral foot pain.  He is having worsening pain when he is on his feet.  The pain is at the base of each heel.   Review of Systems See HPI   HISTORY: Past Medical, Surgical, Social, and Family History Reviewed & Updated per EMR.   Pertinent Historical Findings include:  Past Medical History:  Diagnosis Date   GERD (gastroesophageal reflux disease)    History of dislocation of shoulder    Insomnia    Obesity    Seizures ([place]) 03/2007   Thyroid disease    hypothyroidism    Past Surgical History:  Procedure Laterality Date   SHOULDER SURGERY     Dr. Angelene Giovanni     PHYSICAL EXAM:  VS: Ht '6\' 3"'$  (1.905 m)   Wt 270 lb (122.5 kg)   BMI 33.75 kg/m  Physical Exam Gen: NAD, alert, cooperative with exam, well-appearing MSK:  Neurovascularly intact    Patient was fitted for a standard, cushioned, semi-rigid orthotic. The orthotic was heated and afterward the patient stood on the orthotic blank positioned on the orthotic stand. The patient was positioned in subtalar neutral position and 10 degrees of ankle dorsiflexion in a weight bearing stance. After completion of molding, a stable base was applied to the orthotic blank. The blank was ground to a stable position for weight bearing. Size: 14 Pairs: 2 Base: Blue EVA Additional Posting and Padding: left heel lift The patient ambulated these, and they were very comfortable.    ASSESSMENT & PLAN:   Bilateral plantar fasciitis Acute on chronic in nature.  Has more medial translation on the left compared to the right. -Counseled on home exercise therapy and supportive care. -Orthotics -Could consider adding scaphoid pad on the left

## 2021-10-08 DIAGNOSIS — E291 Testicular hypofunction: Secondary | ICD-10-CM

## 2021-10-11 ENCOUNTER — Encounter: Payer: Self-pay | Admitting: Physician Assistant

## 2021-10-12 MED ORDER — TESTOSTERONE CYPIONATE 200 MG/ML IM SOLN: INTRAMUSCULAR | 1 refills | Status: DC

## 2021-10-12 MED ORDER — ACETIC ACID 2 % OT SOLN: 4.0000 [drp] | Freq: Three times a day (TID) | OTIC | 0 refills | Status: DC

## 2021-11-05 ENCOUNTER — Ambulatory Visit: Payer: PRIVATE HEALTH INSURANCE | Admitting: Sports Medicine

## 2021-11-17 ENCOUNTER — Encounter: Payer: Self-pay | Admitting: Sports Medicine

## 2021-11-17 ENCOUNTER — Ambulatory Visit (INDEPENDENT_AMBULATORY_CARE_PROVIDER_SITE_OTHER): Payer: 59 | Admitting: Sports Medicine

## 2021-11-17 DIAGNOSIS — M722 Plantar fascial fibromatosis: Secondary | ICD-10-CM

## 2021-11-17 NOTE — Assessment & Plan Note (Addendum)
Wesley Mckay returns as well, he is a pleasant 41 year old male, he does have morbid obesity, bilateral plantar fasciitis, we did bilateral plantar fascia injections, custom orthotics. He has not really been consistent with home conditioning. Unfortunately he does not report any improvement. He was given a left-sided heel lift by his chiropractor, I measured his leg lengths and there completely symmetric from the ASIS to the medial malleolus to be removed the left-sided heel lift. He agrees to get more consistent with home conditioning, particularly the strengthening exercises for the intrinsics of his foot. 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 If insufficient improvement after an additional 6 weeks of conservative treatment we will consider shockwave treatment and potentially an operation. He has also agreed to lose 10 pounds over the next 6 weeks.

## 2021-11-17 NOTE — Progress Notes (Signed)
    Procedures performed today:    None.  Independent interpretation of notes and tests performed by another provider:   None.  Brief History, Exam, Impression, and Recommendations:    Bilateral plantar fasciitis Fate returns as well, he is a pleasant 41 year old male, he does have morbid obesity, bilateral plantar fasciitis, we did bilateral plantar fascia injections, custom orthotics. He has not really been consistent with home conditioning. Unfortunately he does not report any improvement. He was given a left-sided heel lift by his chiropractor, I measured his leg lengths and there completely symmetric from the ASIS to the medial malleolus to be removed the left-sided heel lift. He agrees to get more consistent with home conditioning, particularly the strengthening exercises for the intrinsics of his foot. 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 If insufficient improvement after an additional 6 weeks of conservative treatment we will consider shockwave treatment and potentially an operation. He has also agreed to lose 10 pounds over the next 6 weeks.    ____________________________________________ Gwen Her. Dianah Field, M.D., ABFM., CAQSM., AME. Primary Care and Sports Medicine [place] [facility]  Adjunct Professor of [place] of [facility] of Medicine  Risk manager

## 2021-12-02 ENCOUNTER — Other Ambulatory Visit: Payer: Self-pay | Admitting: Physician Assistant

## 2021-12-02 DIAGNOSIS — F902 Attention-deficit hyperactivity disorder, combined type: Secondary | ICD-10-CM

## 2021-12-02 NOTE — Telephone Encounter (Signed)
Last OV: 11/17/21 (w/Dr. T) Next OV: 12/28/21 (w/Dr. T) Last RF: 09/21/21

## 2021-12-03 MED ORDER — LISDEXAMFETAMINE DIMESYLATE 60 MG PO CAPS: 60.0000 mg | ORAL_CAPSULE | ORAL | 0 refills | Status: DC

## 2021-12-11 ENCOUNTER — Other Ambulatory Visit: Payer: Self-pay | Admitting: Physician Assistant

## 2021-12-11 DIAGNOSIS — F5101 Primary insomnia: Secondary | ICD-10-CM

## 2021-12-28 ENCOUNTER — Ambulatory Visit (INDEPENDENT_AMBULATORY_CARE_PROVIDER_SITE_OTHER): Payer: 59

## 2021-12-28 ENCOUNTER — Ambulatory Visit (INDEPENDENT_AMBULATORY_CARE_PROVIDER_SITE_OTHER): Payer: 59 | Admitting: Sports Medicine

## 2021-12-28 DIAGNOSIS — M79671 Pain in right foot: Secondary | ICD-10-CM | POA: Diagnosis not present

## 2021-12-28 DIAGNOSIS — M722 Plantar fascial fibromatosis: Secondary | ICD-10-CM

## 2021-12-28 DIAGNOSIS — M79672 Pain in left foot: Secondary | ICD-10-CM

## 2021-12-28 DIAGNOSIS — M84375A Stress fracture, left foot, initial encounter for fracture: Secondary | ICD-10-CM | POA: Insufficient documentation

## 2021-12-28 NOTE — Assessment & Plan Note (Signed)
Samyak also had bilateral plantar fasciitis, morbid obesity, he has had bilateral plantar fascial injections, custom molded orthotics, not really consistent with home conditioning. He was given a left-sided heel lift by his chiropractor however I did measure his leg lengths from ASIS to medial malleolus and they were symmetric bilaterally so we removed the heel lift and his plantar fascia pain has resolved.

## 2021-12-28 NOTE — Assessment & Plan Note (Addendum)
Wesley Mckay returns, we were treating him for bilateral plantar fasciitis which has since resolved since removing a heel lift placed in his orthotic by a chiropractor. Unfortunately he is having pain in the midfoot, plantar aspect. Worse with prolonged weightbearing. He also has not lost any weight. He has significant pes planus bilaterally. Considering chronic pain in spite of conservative treatment I do think we need to get updated x-rays, bilateral MRIs of both feet, as well as a left ankle MRI. Question stress injury intertarsal. When he comes back for an MRI follow-up he will bring his orthotics and we can consider adding some blue foam over a scaphoid pad to improve his cushioning.

## 2021-12-28 NOTE — Progress Notes (Addendum)
    Procedures performed today:    None.  Independent interpretation of notes and tests performed by another provider:   None.  Brief History, Exam, Impression, and Recommendations:    Bilateral foot pain, chronic Humphrey returns, we were treating him for bilateral plantar fasciitis which has since resolved since removing a heel lift placed in his orthotic by a chiropractor. Unfortunately he is having pain in the midfoot, plantar aspect. Worse with prolonged weightbearing. He also has not lost any weight. He has significant pes planus bilaterally. Considering chronic pain in spite of conservative treatment I do think we need to get updated x-rays, bilateral MRIs of both feet, as well as a left ankle MRI. Question stress injury intertarsal. When he comes back for an MRI follow-up he will bring his orthotics and we can consider adding some blue foam over a scaphoid pad to improve his cushioning.  Bilateral plantar fasciitis Wesley Mckay also had bilateral plantar fasciitis, morbid obesity, he has had bilateral plantar fascial injections, custom molded orthotics, not really consistent with home conditioning. He was given a left-sided heel lift by his chiropractor however I did measure his leg lengths from ASIS to medial malleolus and they were symmetric bilaterally so we removed the heel lift and his plantar fascia pain has resolved.  I spent 30 minutes of total time managing this patient today, this includes chart review, face to face, and non-face to face time.  ____________________________________________ Gwen Her. Dianah Field, M.D., ABFM., CAQSM., AME. Primary Care and Sports Medicine [place] [facility]  Adjunct Professor of [place] of [facility] of Medicine  Risk manager

## 2022-02-01 ENCOUNTER — Ambulatory Visit (INDEPENDENT_AMBULATORY_CARE_PROVIDER_SITE_OTHER): Payer: PRIVATE HEALTH INSURANCE

## 2022-02-01 ENCOUNTER — Ambulatory Visit (INDEPENDENT_AMBULATORY_CARE_PROVIDER_SITE_OTHER): Payer: 59

## 2022-02-01 DIAGNOSIS — M79671 Pain in right foot: Secondary | ICD-10-CM

## 2022-02-01 DIAGNOSIS — M79672 Pain in left foot: Secondary | ICD-10-CM

## 2022-02-09 ENCOUNTER — Other Ambulatory Visit: Payer: Self-pay | Admitting: Physician Assistant

## 2022-02-10 MED ORDER — VYVANSE 60 MG PO CAPS: 60.0000 mg | ORAL_CAPSULE | ORAL | 0 refills | Status: DC

## 2022-02-10 NOTE — Telephone Encounter (Signed)
Patient needs appt

## 2022-02-10 NOTE — Telephone Encounter (Signed)
Called patient to schedule appointment, voicemail full, will try again later, thanks.

## 2022-02-11 ENCOUNTER — Ambulatory Visit: Payer: 59 | Admitting: Sports Medicine

## 2022-02-11 DIAGNOSIS — M84375A Stress fracture, left foot, initial encounter for fracture: Secondary | ICD-10-CM

## 2022-02-11 NOTE — Progress Notes (Signed)
    Procedures performed today:    None.  Independent interpretation of notes and tests performed by another provider:   None.  Brief History, Exam, Impression, and Recommendations:    Stress fracture of left calcaneus Pleasant 41 year old male, chronic bilateral foot pain, left worse than right, ultimately we obtained MRIs, he did have some plantar fasciitis but also what appears to be a mid calcaneus stress injury on the left, we will place him in a boot for 4 to 6 weeks, I also modified his orthotics on both sides with bilateral scaphoid pads and blue cushioning. Return to see me in approximately 6 weeks.  I spent 30 minutes of total time managing this patient today, this includes chart review, face to face, and non-face to face time.  ____________________________________________ Gwen Her. Dianah Field, M.D., ABFM., CAQSM., AME. Primary Care and Sports Medicine [place] [facility]  Adjunct Professor of [place] of [facility] of Medicine  Risk manager

## 2022-02-11 NOTE — Assessment & Plan Note (Signed)
Pleasant 41 year old male, chronic bilateral foot pain, left worse than right, ultimately we obtained MRIs, he did have some plantar fasciitis but also what appears to be a mid calcaneus stress injury on the left, we will place him in a boot for 4 to 6 weeks, I also modified his orthotics on both sides with bilateral scaphoid pads and blue cushioning. Return to see me in approximately 6 weeks.

## 2022-02-18 ENCOUNTER — Encounter: Payer: Self-pay | Admitting: Physician Assistant

## 2022-02-18 ENCOUNTER — Ambulatory Visit (INDEPENDENT_AMBULATORY_CARE_PROVIDER_SITE_OTHER): Payer: 59 | Admitting: Physician Assistant

## 2022-02-18 VITALS — BP 142/91 | HR 78 | Temp 98.3°F | Ht 75.0 in | Wt 275.0 lb

## 2022-02-18 DIAGNOSIS — Z20818 Contact with and (suspected) exposure to other bacterial communicable diseases: Secondary | ICD-10-CM

## 2022-02-18 DIAGNOSIS — J029 Acute pharyngitis, unspecified: Secondary | ICD-10-CM

## 2022-02-18 MED ORDER — AMOXICILLIN-POT CLAVULANATE 875-125 MG PO TABS: 1.0000 | ORAL_TABLET | Freq: Two times a day (BID) | ORAL | 0 refills | Status: DC

## 2022-02-18 NOTE — Patient Instructions (Signed)

## 2022-02-18 NOTE — Progress Notes (Signed)
Acute Office Visit  Subjective:     Patient ID: Wesley Mckay, male    DOB: December 21, 1980, 42 y.o.   MRN: [id number]  Chief Complaint  Patient presents with   Sore Throat    HPI Patient is in today for ST, fatigue, body aches, trouble swallowing. Pt denies any cough or SOB. Taking tylenol, ibuprofen, mucinex with little relief. Wife had strep and treated with abx.  .. 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   Major depressive disorder, recurrent episode, moderate (Wesley Mckay) 12/19/2014   B12 deficiency 09/15/2015   Sinus bradycardia 09/15/2015   Seizure disorder (Wesley Mckay) 02/29/2016   Erectile dysfunction 02/29/2016   Vitamin D deficiency 04/11/2016   Anxiety 11/17/2016   Muscle spasm 07/14/2017   Neck pain 07/14/2017   Attention deficit hyperactivity disorder (ADHD), combined type 07/14/2017   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   Nonspecific findings on examination of blood 09/18/2012   Sleeping difficulty 09/18/2012   Non-restorative sleep 10/30/2018   Snoring 10/30/2018   Class 1 obesity due to excess calories without serious comorbidity with body mass index (BMI) of 34.0 to 34.9 in adult 10/30/2018   Suspicious nevus 11/02/2018   Elevated blood pressure reading 11/02/2018   OSA (obstructive sleep apnea) 02/22/2019   DDD (degenerative disc disease), cervical 03/16/2020   Spinal hemangioma 03/16/2020   Chronic right shoulder pain 06/08/2020   Mass of joint of right shoulder 06/10/2020   Folate deficiency 06/24/2020   Dyslipidemia (high LDL; low HDL) 06/24/2020   Mood  changes 09/21/2021   Stress fracture of left calcaneus 12/28/2021   Resolved Ambulatory Problems    Diagnosis Date Noted   FOOT PAIN, RIGHT 11/29/2007   Bilateral foot pain 08/16/2012   Past Medical History:  Diagnosis Date   GERD (gastroesophageal reflux disease)    History of dislocation of shoulder    Insomnia    Obesity    Seizures (Wesley Mckay) 03/2007   Thyroid disease      ROS See HPI.      Objective:    BP (!) 142/91   Pulse 78   Temp 98.3 F (36.8 C) (Oral)   Ht '6\' 3"'$  (1.905 m)   Wt 275 lb (124.7 kg)   SpO2 96%   BMI 34.37 kg/m  BP Readings from Last 3 Encounters:  02/18/22 (!) 142/91  11/17/21 (!) 137/90  09/21/21 (!) 142/85   Wt Readings from Last 3 Encounters:  02/18/22 275 lb (124.7 kg)  11/17/21 264 lb (119.7 kg)  09/28/21 270 lb (122.5 kg)      Physical Exam Constitutional:      Appearance: He is well-developed. He is obese.  HENT:     Head: Normocephalic.     Right Ear: Tympanic membrane and ear canal normal. No drainage, swelling or tenderness. No middle ear effusion. Tympanic membrane is not erythematous.     Left Ear: Tympanic membrane and ear canal normal. No drainage, swelling or tenderness.  No middle ear effusion. Tympanic membrane is not erythematous.     Mouth/Throat:     Mouth: Mucous membranes are moist.     Pharynx: Uvula midline.  Oropharyngeal exudate, posterior oropharyngeal erythema and uvula swelling present.     Tonsils: Tonsillar exudate present. No tonsillar abscesses. 1+ on the right. 1+ on the left.  Eyes:     Conjunctiva/sclera: Conjunctivae normal.  Cardiovascular:     Rate and Rhythm: Normal rate and regular rhythm.     Heart sounds: Normal heart sounds.  Pulmonary:     Effort: Pulmonary effort is normal.     Breath sounds: Normal breath sounds.  Neurological:     General: No focal deficit present.     Mental Status: He is alert.  Psychiatric:        Mood and Affect: Mood normal.          Assessment & Plan:   Marland KitchenMarland KitchenAuden was seen today for sore throat.  Diagnoses and all orders for this visit:  Exposure to strep throat -     amoxicillin-clavulanate (AUGMENTIN) 875-125 MG tablet; Take 1 tablet by mouth 2 (two) times daily.  Acute pharyngitis, unspecified etiology -     amoxicillin-clavulanate (AUGMENTIN) 875-125 MG tablet; Take 1 tablet by mouth 2 (two) times daily.   Exposure with swollen tonsils and exudate with no cough Sent augmentin for 10 days Discussed symptomatic care HO given Follow up as needed or if symptoms persist or worsen   Iran Planas, PA-C

## 2022-03-11 ENCOUNTER — Ambulatory Visit (INDEPENDENT_AMBULATORY_CARE_PROVIDER_SITE_OTHER): Payer: 59 | Admitting: Sports Medicine

## 2022-03-11 ENCOUNTER — Ambulatory Visit (INDEPENDENT_AMBULATORY_CARE_PROVIDER_SITE_OTHER): Payer: 59

## 2022-03-11 DIAGNOSIS — M84375A Stress fracture, left foot, initial encounter for fracture: Secondary | ICD-10-CM

## 2022-03-11 NOTE — Progress Notes (Signed)
    Procedures performed today:    Short leg cast applied.  Independent interpretation of notes and tests performed by another provider:   None.  Brief History, Exam, Impression, and Recommendations:    Stress fracture of left calcaneus This is a very pleasant 42 year old male, he has chronic bilateral left worse than right foot pain, left foot pain is predominantly at the heel, is an MRI confirmed calcaneal stress fracture, he has been in a boot but intermittently compliant. Force compliance replaced him in a cast today. He will be nonweightbearing with crutches for at least 4 weeks. I would like an x-ray through the cast today.    ____________________________________________ Gwen Her. Dianah Field, M.D., ABFM., CAQSM., AME. Primary Care and Sports Medicine [place] [facility]  Adjunct Professor of [place] of [facility] of Medicine  Risk manager

## 2022-03-11 NOTE — Assessment & Plan Note (Signed)
This is a very pleasant 42 year old male, he has chronic bilateral left worse than right foot pain, left foot pain is predominantly at the heel, is an MRI confirmed calcaneal stress fracture, he has been in a boot but intermittently compliant. Force compliance replaced him in a cast today. He will be nonweightbearing with crutches for at least 4 weeks. I would like an x-ray through the cast today.

## 2022-03-25 ENCOUNTER — Other Ambulatory Visit: Payer: Self-pay | Admitting: Physician Assistant

## 2022-03-25 ENCOUNTER — Ambulatory Visit: Payer: PRIVATE HEALTH INSURANCE | Admitting: Sports Medicine

## 2022-03-25 DIAGNOSIS — K219 Gastro-esophageal reflux disease without esophagitis: Secondary | ICD-10-CM

## 2022-03-28 ENCOUNTER — Encounter: Payer: Self-pay | Admitting: Sports Medicine

## 2022-03-29 ENCOUNTER — Other Ambulatory Visit: Payer: Self-pay | Admitting: Physician Assistant

## 2022-03-29 ENCOUNTER — Ambulatory Visit: Payer: 59 | Admitting: Sports Medicine

## 2022-03-29 VITALS — Wt 275.0 lb

## 2022-03-29 DIAGNOSIS — E6609 Other obesity due to excess calories: Secondary | ICD-10-CM | POA: Diagnosis not present

## 2022-03-29 DIAGNOSIS — Z6834 Body mass index (BMI) 34.0-34.9, adult: Secondary | ICD-10-CM

## 2022-03-29 DIAGNOSIS — M84375A Stress fracture, left foot, initial encounter for fracture: Secondary | ICD-10-CM

## 2022-03-29 DIAGNOSIS — N529 Male erectile dysfunction, unspecified: Secondary | ICD-10-CM

## 2022-03-29 DIAGNOSIS — E039 Hypothyroidism, unspecified: Secondary | ICD-10-CM

## 2022-03-29 MED ORDER — RYBELSUS 3 MG PO TABS: 3.0000 mg | ORAL_TABLET | Freq: Every day | ORAL | 0 refills | Status: DC

## 2022-03-29 NOTE — Progress Notes (Signed)
    Procedures performed today:    None.  Independent interpretation of notes and tests performed by another provider:   None.  Brief History, Exam, Impression, and Recommendations:    Stress fracture of left calcaneus As before this is a pleasant 42 year old male with chronic bilateral left worse than right foot pain, left foot pain predominantly heel, MRI confirmed calcaneal stress fracture, he was intermittently compliant in the boot. Back on 26 January we switched to force compliance in a short leg cast, the bottom of the cast has worn through so we reinforced it. He will keep his previous appointment for cast removal, he will have been in the cast for about 4 weeks.  Class 1 obesity due to excess calories without serious comorbidity with body mass index (BMI) of 34.0 to 34.9 in adult Obesity noted, he was interested in GLP-1 treatment, I gave him a sample of Rybelsus today. We can see him back in a month for weight check and then return him to PCP.  Chronic process not at goal with pharmacologic intervention  ____________________________________________ Gwen Her. Dianah Field, M.D., ABFM., CAQSM., AME. Primary Care and Sports Medicine  [facility]  Adjunct Professor of [place] of [facility] of Medicine  Risk manager

## 2022-03-29 NOTE — Assessment & Plan Note (Signed)
As before this is a pleasant 42 year old male with chronic bilateral left worse than right foot pain, left foot pain predominantly heel, MRI confirmed calcaneal stress fracture, he was intermittently compliant in the boot. Back on 26 January we switched to force compliance in a short leg cast, the bottom of the cast has worn through so we reinforced it. He will keep his previous appointment for cast removal, he will have been in the cast for about 4 weeks.

## 2022-03-29 NOTE — Assessment & Plan Note (Signed)
Obesity noted, he was interested in GLP-1 treatment, I gave him a sample of Rybelsus today. We can see him back in a month for weight check and then return him to PCP.

## 2022-04-07 ENCOUNTER — Ambulatory Visit (INDEPENDENT_AMBULATORY_CARE_PROVIDER_SITE_OTHER): Payer: 59 | Admitting: Physician Assistant

## 2022-04-07 ENCOUNTER — Encounter: Payer: Self-pay | Admitting: Physician Assistant

## 2022-04-07 VITALS — BP 128/83 | HR 81 | Temp 97.9°F | Ht 75.0 in | Wt 270.0 lb

## 2022-04-07 DIAGNOSIS — E291 Testicular hypofunction: Secondary | ICD-10-CM | POA: Diagnosis not present

## 2022-04-07 DIAGNOSIS — N529 Male erectile dysfunction, unspecified: Secondary | ICD-10-CM | POA: Diagnosis not present

## 2022-04-07 DIAGNOSIS — Z6834 Body mass index (BMI) 34.0-34.9, adult: Secondary | ICD-10-CM

## 2022-04-07 DIAGNOSIS — G4733 Obstructive sleep apnea (adult) (pediatric): Secondary | ICD-10-CM | POA: Diagnosis not present

## 2022-04-07 DIAGNOSIS — E538 Deficiency of other specified B group vitamins: Secondary | ICD-10-CM

## 2022-04-07 DIAGNOSIS — E6609 Other obesity due to excess calories: Secondary | ICD-10-CM

## 2022-04-07 DIAGNOSIS — E039 Hypothyroidism, unspecified: Secondary | ICD-10-CM

## 2022-04-07 DIAGNOSIS — L219 Seborrheic dermatitis, unspecified: Secondary | ICD-10-CM

## 2022-04-07 DIAGNOSIS — F902 Attention-deficit hyperactivity disorder, combined type: Secondary | ICD-10-CM

## 2022-04-07 DIAGNOSIS — Z716 Tobacco abuse counseling: Secondary | ICD-10-CM

## 2022-04-07 DIAGNOSIS — E559 Vitamin D deficiency, unspecified: Secondary | ICD-10-CM

## 2022-04-07 MED ORDER — NICOTINE 14 MG/24HR TD PT24: 14.0000 mg | MEDICATED_PATCH | Freq: Every day | TRANSDERMAL | 1 refills | Status: DC

## 2022-04-07 MED ORDER — CYANOCOBALAMIN 1000 MCG/ML IJ SOLN: INTRAMUSCULAR | 11 refills | Status: DC

## 2022-04-07 MED ORDER — KETOCONAZOLE 2 % EX SHAM: MEDICATED_SHAMPOO | CUTANEOUS | 11 refills | Status: DC

## 2022-04-07 MED ORDER — TADALAFIL 20 MG PO TABS: 10.0000 mg | ORAL_TABLET | ORAL | 5 refills | Status: DC

## 2022-04-07 MED ORDER — HYDROCORTISONE 1 % EX OINT: 1.0000 | TOPICAL_OINTMENT | Freq: Two times a day (BID) | CUTANEOUS | 0 refills | Status: AC

## 2022-04-07 NOTE — Progress Notes (Signed)
Established Patient Office Visit  Subjective   Patient ID: Wesley Mckay, male    DOB: 06-13-1980  Age: 42 y.o. MRN: [id number]  Chief Complaint  Patient presents with   Medication Refill   Follow-up    HPI Pt is a 42 yo obese male with hypothyroidism, hypogonadism, ADHD, Seizure disorder, vitamin D deficiency who presents to the clinic for follow up.   He was just started on sample of reybelsus. He has lost 10lbs. He is down to 2 coke's a day and less than a half pack of cigarettes. Seroquel is helpeful to sleep. He is not exercising due to cast on left leg/ankle.   Pt is taking testosterone regularly in form on injections.   No recent seizures.   He is sleeping well and using CPaP nightly.   He does mention a scaly red rash in beard, eye brows and around nose.  .. 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   Major depressive disorder, recurrent episode, moderate ([place]) 12/19/2014   B12 deficiency 09/15/2015   Sinus bradycardia 09/15/2015   Seizure disorder ([place]) 02/29/2016   Erectile dysfunction 02/29/2016   Vitamin D deficiency 04/11/2016   Anxiety 11/17/2016   Muscle spasm 07/14/2017   Neck pain 07/14/2017   Attention deficit hyperactivity disorder (ADHD), combined type 07/14/2017   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   Nonspecific findings on examination of blood 09/18/2012   Sleeping difficulty 09/18/2012   Non-restorative sleep 10/30/2018   Snoring 10/30/2018   Class 1 obesity due to excess calories without serious comorbidity with body mass index (BMI) of  34.0 to 34.9 in adult 10/30/2018   Suspicious nevus 11/02/2018   Elevated blood pressure reading 11/02/2018   OSA (obstructive sleep apnea) 02/22/2019   DDD (degenerative disc disease), cervical 03/16/2020   Spinal hemangioma 03/16/2020   Chronic right shoulder pain 06/08/2020   Mass of joint of right shoulder 06/10/2020   Folate deficiency 06/24/2020   Dyslipidemia (high LDL; low HDL) 06/24/2020   Mood changes 09/21/2021   Stress fracture of left calcaneus 12/28/2021   Resolved Ambulatory Problems    Diagnosis Date Noted   FOOT PAIN, RIGHT 11/29/2007   Bilateral foot pain 08/16/2012   Past Medical History:  Diagnosis Date   GERD (gastroesophageal reflux disease)    History of dislocation of shoulder    Insomnia    Obesity    Seizures ([place]) 03/2007   Thyroid disease      ROS See HPI.    Objective:     BP 128/83 (BP Location: Right Arm, Patient Position: Sitting, Cuff Size: Large)   Pulse 81   Temp 97.9 F (36.6 C) (Oral)   Ht '6\' 3"'$  (1.905 m)   Wt 270 lb (122.5 kg) Comment: Patient reported / patient on crutches  SpO2 96%   BMI 33.75 kg/m  BP Readings from Last 3 Encounters:  04/07/22 128/83  02/18/22 (!) 142/91  11/17/21 (!) 137/90   Wt Readings from Last 3 Encounters:  04/08/22 272 lb (123.4 kg)  04/07/22 270 lb (122.5 kg)  03/29/22 275 lb (124.7 kg)      Physical Exam  Constitutional:      Appearance: Normal appearance. He is obese.  HENT:     Head: Normocephalic.  Cardiovascular:     Rate and Rhythm: Normal rate.  Pulmonary:     Effort: Pulmonary effort is normal.     Breath sounds: Normal breath sounds.  Musculoskeletal:     Cervical back: No tenderness.     Comments: Left cast up to left knee.   Lymphadenopathy:     Cervical: No cervical adenopathy.  Skin:    Comments: Fine scales on erythematous base around sides of noses, beard, eye brows nose, and ears.   Neurological:     General: No focal deficit present.     Mental Status: He is  alert and oriented to person, place, and time.  Psychiatric:        Mood and Affect: Mood normal.        The 10-year ASCVD risk score (Arnett DK, et al., 2019) is: 5.5%    Assessment & Plan:  Marland KitchenMarland KitchenAreeb was seen today for medication refill and follow-up.  Diagnoses and all orders for this visit:  OSA (obstructive sleep apnea) -     CBC w/Diff/Platelet -     COMPLETE METABOLIC PANEL WITH GFR  123456 deficiency -     cyanocobalamin (VITAMIN B12) 1000 MCG/ML injection; INJECT 1 ML (1,000 MCG TOTAL) INTO THE MUSCLE EVERY 30 DAYS. -     CBC w/Diff/Platelet -     COMPLETE METABOLIC PANEL WITH GFR  Erectile dysfunction, unspecified erectile dysfunction type -     tadalafil (CIALIS) 20 MG tablet; Take 0.5-1 tablets (10-20 mg total) by mouth every other day. -     CBC w/Diff/Platelet -     COMPLETE METABOLIC PANEL WITH GFR  Male hypogonadism -     Testosterone Total,Free,Bio, Males -     CBC w/Diff/Platelet -     COMPLETE METABOLIC PANEL WITH GFR  Class 1 obesity due to excess calories without serious comorbidity with body mass index (BMI) of 34.0 to 34.9 in adult  Attention deficit hyperactivity disorder (ADHD), combined type -     VYVANSE 60 MG capsule; Take 1 capsule (60 mg total) by mouth every morning. -     lisdexamfetamine (VYVANSE) 60 MG capsule; Take 1 capsule (60 mg total) by mouth every morning. -     lisdexamfetamine (VYVANSE) 60 MG capsule; Take 1 capsule (60 mg total) by mouth every morning.  Vitamin D insufficiency  Hypothyroidism, unspecified type  Encounter for smoking cessation counseling -     nicotine (NICODERM CQ) 14 mg/24hr patch; Place 1 patch (14 mg total) onto the skin daily.  Seborrheic dermatitis -     ketoconazole (NIZORAL) 2 % shampoo; Apply to scalp/beard twice a week for 8 weeks and then weekly thereafter -     hydrocortisone 1 % ointment; Apply 1 Application topically 2 (two) times daily.   Fasting labs ordered.  Vitals look good.  PHQ/GAD  looks good.  Vyanse refilled Discussed seb dermatsis and gave ketoconazole and topical steroid  HO given  Follow up as needed  Disused importance of smoking cessation Nicoderm patches were given.     Iran Planas, PA-C

## 2022-04-08 ENCOUNTER — Encounter: Payer: Self-pay | Admitting: Sports Medicine

## 2022-04-08 ENCOUNTER — Ambulatory Visit (INDEPENDENT_AMBULATORY_CARE_PROVIDER_SITE_OTHER): Payer: 59 | Admitting: Sports Medicine

## 2022-04-08 VITALS — Wt 272.0 lb

## 2022-04-08 DIAGNOSIS — Z6834 Body mass index (BMI) 34.0-34.9, adult: Secondary | ICD-10-CM

## 2022-04-08 DIAGNOSIS — M84375A Stress fracture, left foot, initial encounter for fracture: Secondary | ICD-10-CM

## 2022-04-08 DIAGNOSIS — E6609 Other obesity due to excess calories: Secondary | ICD-10-CM | POA: Diagnosis not present

## 2022-04-08 LAB — COMPLETE METABOLIC PANEL WITH GFR
AG Ratio: 1.8 (calc) (ref 1.0–2.5)
ALT: 10 U/L (ref 9–46)
AST: 12 U/L (ref 10–40)
Albumin: 4.9 g/dL (ref 3.6–5.1)
Alkaline phosphatase (APISO): 101 U/L (ref 36–130)
BUN: 12 mg/dL (ref 7–25)
CO2: 25 mmol/L (ref 20–32)
Calcium: 9.7 mg/dL (ref 8.6–10.3)
Chloride: 101 mmol/L (ref 98–110)
Creat: 1.07 mg/dL (ref 0.60–1.29)
Globulin: 2.8 g/dL (calc) (ref 1.9–3.7)
Glucose, Bld: 85 mg/dL (ref 65–99)
Potassium: 4.3 mmol/L (ref 3.5–5.3)
Sodium: 138 mmol/L (ref 135–146)
Total Bilirubin: 0.6 mg/dL (ref 0.2–1.2)
Total Protein: 7.7 g/dL (ref 6.1–8.1)
eGFR: 88 mL/min/{1.73_m2} (ref >=60)

## 2022-04-08 LAB — CBC WITH DIFFERENTIAL/PLATELET
Absolute Monocytes: 490 cells/uL (ref 200–950)
Basophils Absolute: 71 cells/uL (ref 0–200)
Basophils Relative: 0.9 %
Eosinophils Absolute: 158 cells/uL (ref 15–500)
Eosinophils Relative: 2 %
HCT: 53.8 % — ABNORMAL HIGH (ref 38.5–50.0)
Hemoglobin: 18.4 g/dL — ABNORMAL HIGH (ref 13.2–17.1)
Lymphs Abs: 1936 cells/uL (ref 850–3900)
MCH: 29.2 pg (ref 27.0–33.0)
MCHC: 34.2 g/dL (ref 32.0–36.0)
MCV: 85.4 fL (ref 80.0–100.0)
MPV: 9.6 fL (ref 7.5–12.5)
Monocytes Relative: 6.2 %
Neutro Abs: 5246 cells/uL (ref 1500–7800)
Neutrophils Relative %: 66.4 %
Platelets: 233 10*3/uL (ref 140–400)
RBC: 6.3 10*6/uL — ABNORMAL HIGH (ref 4.20–5.80)
RDW: 14.2 % (ref 11.0–15.0)
Total Lymphocyte: 24.5 %
WBC: 7.9 10*3/uL (ref 3.8–10.8)

## 2022-04-08 LAB — TESTOSTERONE TOTAL,FREE,BIO, MALES
Albumin: 4.9 g/dL (ref 3.6–5.1)
Sex Hormone Binding: 24 nmol/L (ref 10–50)
Testosterone, Bioavailable: 185.8 ng/dL (ref 110.0–575.0)
Testosterone, Free: 83.3 pg/mL (ref 46.0–224.0)
Testosterone: 496 ng/dL (ref 250–827)

## 2022-04-08 MED ORDER — RYBELSUS 3 MG PO TABS: 6.0000 mg | ORAL_TABLET | Freq: Every day | ORAL | 0 refills | Status: DC

## 2022-04-08 NOTE — Progress Notes (Signed)
Wesley Mckay,   Hemoglobin/RBC/HCT are elevated. Smoking can do this/testosterone can do this. It does need to be followed because this an be a risk factor for stroke. Decrease your testosterone for now. Confirm exactly how much you are taking and then I can let you know how to decrease and then recheck cbc.

## 2022-04-08 NOTE — Assessment & Plan Note (Signed)
He has done well with the past month of Rybelsus, he will do 2 Rybelsus sample tabs daily and follow this up with his PCP.

## 2022-04-08 NOTE — Assessment & Plan Note (Signed)
Pleasant 42 year old male, MRI confirmed calcaneal stress fracture on the left, he was intermittently compliant in the boot so we placed him in a cast, he has been in a cast for a month and returns today with no pain, cast is removed, no pain over the fracture, he will minimize weightbearing over the next month and return to see me as needed.

## 2022-04-08 NOTE — Progress Notes (Signed)
    Procedures performed today:    None.  Independent interpretation of notes and tests performed by another provider:   None.  Brief History, Exam, Impression, and Recommendations:    Stress fracture of left calcaneus Pleasant 41 year old male, MRI confirmed calcaneal stress fracture on the left, he was intermittently compliant in the boot so we placed him in a cast, he has been in a cast for a month and returns today with no pain, cast is removed, no pain over the fracture, he will minimize weightbearing over the next month and return to see me as needed.  Class 1 obesity due to excess calories without serious comorbidity with body mass index (BMI) of 34.0 to 34.9 in adult He has done well with the past month of Rybelsus, he will do 2 Rybelsus sample tabs daily and follow this up with his PCP.    ____________________________________________ Gwen Her. Dianah Field, M.D., ABFM., CAQSM., AME. Primary Care and Sports Medicine [facility]  Adjunct Professor of [place] of [facility] of Medicine  Risk manager

## 2022-04-11 MED ORDER — VYVANSE 60 MG PO CAPS: 60.0000 mg | ORAL_CAPSULE | ORAL | 0 refills | Status: DC

## 2022-04-11 MED ORDER — LISDEXAMFETAMINE DIMESYLATE 60 MG PO CAPS: 60.0000 mg | ORAL_CAPSULE | ORAL | 0 refills | Status: DC

## 2022-04-12 ENCOUNTER — Other Ambulatory Visit (HOSPITAL_BASED_OUTPATIENT_CLINIC_OR_DEPARTMENT_OTHER): Payer: Self-pay

## 2022-04-12 ENCOUNTER — Encounter: Payer: Self-pay | Admitting: Physician Assistant

## 2022-04-12 DIAGNOSIS — F902 Attention-deficit hyperactivity disorder, combined type: Secondary | ICD-10-CM

## 2022-04-12 MED ORDER — LISDEXAMFETAMINE DIMESYLATE 60 MG PO CAPS
60.0000 mg | ORAL_CAPSULE | ORAL | 0 refills | Status: DC
  Filled 2022-04-12: qty 30, 30d supply, fill #0

## 2022-04-26 ENCOUNTER — Ambulatory Visit: Payer: PRIVATE HEALTH INSURANCE | Admitting: Sports Medicine

## 2022-05-27 ENCOUNTER — Encounter: Payer: Self-pay | Admitting: Physician Assistant

## 2022-05-27 DIAGNOSIS — N529 Male erectile dysfunction, unspecified: Secondary | ICD-10-CM

## 2022-05-27 DIAGNOSIS — E538 Deficiency of other specified B group vitamins: Secondary | ICD-10-CM

## 2022-05-27 DIAGNOSIS — E039 Hypothyroidism, unspecified: Secondary | ICD-10-CM

## 2022-05-27 DIAGNOSIS — F5101 Primary insomnia: Secondary | ICD-10-CM

## 2022-05-27 DIAGNOSIS — G40909 Epilepsy, unspecified, not intractable, without status epilepticus: Secondary | ICD-10-CM

## 2022-05-27 DIAGNOSIS — F902 Attention-deficit hyperactivity disorder, combined type: Secondary | ICD-10-CM

## 2022-05-27 DIAGNOSIS — K219 Gastro-esophageal reflux disease without esophagitis: Secondary | ICD-10-CM

## 2022-05-27 MED ORDER — OMEPRAZOLE 40 MG PO CPDR: 40.0000 mg | DELAYED_RELEASE_CAPSULE | Freq: Two times a day (BID) | ORAL | 11 refills | Status: DC

## 2022-05-27 MED ORDER — LEVOTHYROXINE SODIUM 300 MCG PO TABS: 300.0000 ug | ORAL_TABLET | Freq: Every day | ORAL | 1 refills | Status: DC

## 2022-05-27 MED ORDER — LISDEXAMFETAMINE DIMESYLATE 60 MG PO CAPS
60.0000 mg | ORAL_CAPSULE | ORAL | 0 refills | Status: DC
Start: 2022-06-10 — End: 2022-07-26

## 2022-05-27 MED ORDER — CYANOCOBALAMIN 1000 MCG/ML IJ SOLN: INTRAMUSCULAR | 11 refills | Status: DC

## 2022-05-27 MED ORDER — QUETIAPINE FUMARATE 25 MG PO TABS: 25.0000 mg | ORAL_TABLET | Freq: Every day | ORAL | 5 refills | Status: DC

## 2022-05-27 MED ORDER — TADALAFIL 20 MG PO TABS
10.0000 mg | ORAL_TABLET | ORAL | 5 refills | Status: AC
Start: 2022-05-27 — End: ?

## 2022-05-27 MED ORDER — LAMOTRIGINE 100 MG PO TABS: 100.0000 mg | ORAL_TABLET | Freq: Two times a day (BID) | ORAL | 1 refills | Status: DC

## 2022-05-30 ENCOUNTER — Encounter: Payer: Self-pay | Admitting: *Deleted

## 2022-06-02 ENCOUNTER — Other Ambulatory Visit: Payer: Self-pay | Admitting: Physician Assistant

## 2022-06-02 DIAGNOSIS — E291 Testicular hypofunction: Secondary | ICD-10-CM

## 2022-06-03 NOTE — Telephone Encounter (Signed)
Last OV: 04/07/22 Next OV: no appt scheduled Last labs: 04/07/22, wnl Last RF:  10/12/21

## 2022-06-03 NOTE — Telephone Encounter (Signed)
Sent!

## 2022-06-27 ENCOUNTER — Ambulatory Visit: Payer: 59

## 2022-07-26 ENCOUNTER — Encounter: Payer: Self-pay | Admitting: Physician Assistant

## 2022-07-26 ENCOUNTER — Ambulatory Visit (INDEPENDENT_AMBULATORY_CARE_PROVIDER_SITE_OTHER): Payer: PRIVATE HEALTH INSURANCE | Admitting: Physician Assistant

## 2022-07-26 VITALS — BP 160/90 | HR 88 | Ht 75.0 in | Wt 245.0 lb

## 2022-07-26 DIAGNOSIS — F902 Attention-deficit hyperactivity disorder, combined type: Secondary | ICD-10-CM | POA: Diagnosis not present

## 2022-07-26 DIAGNOSIS — E6609 Other obesity due to excess calories: Secondary | ICD-10-CM

## 2022-07-26 DIAGNOSIS — L219 Seborrheic dermatitis, unspecified: Secondary | ICD-10-CM | POA: Diagnosis not present

## 2022-07-26 DIAGNOSIS — Z6830 Body mass index (BMI) 30.0-30.9, adult: Secondary | ICD-10-CM

## 2022-07-26 DIAGNOSIS — G4733 Obstructive sleep apnea (adult) (pediatric): Secondary | ICD-10-CM

## 2022-07-26 DIAGNOSIS — E291 Testicular hypofunction: Secondary | ICD-10-CM

## 2022-07-26 DIAGNOSIS — E039 Hypothyroidism, unspecified: Secondary | ICD-10-CM

## 2022-07-26 DIAGNOSIS — N529 Male erectile dysfunction, unspecified: Secondary | ICD-10-CM | POA: Diagnosis not present

## 2022-07-26 DIAGNOSIS — I10 Essential (primary) hypertension: Secondary | ICD-10-CM | POA: Diagnosis not present

## 2022-07-26 MED ORDER — LISDEXAMFETAMINE DIMESYLATE 60 MG PO CAPS
60.0000 mg | ORAL_CAPSULE | ORAL | 0 refills | Status: DC
Start: 2022-09-25 — End: 2023-01-31

## 2022-07-26 MED ORDER — LISDEXAMFETAMINE DIMESYLATE 60 MG PO CAPS
60.0000 mg | ORAL_CAPSULE | ORAL | 0 refills | Status: DC
Start: 2022-08-25 — End: 2022-10-03

## 2022-07-26 MED ORDER — LISDEXAMFETAMINE DIMESYLATE 60 MG PO CAPS
60.0000 mg | ORAL_CAPSULE | ORAL | 0 refills | Status: DC
Start: 2022-07-26 — End: 2023-04-12

## 2022-07-26 MED ORDER — KETOCONAZOLE 2 % EX SHAM
MEDICATED_SHAMPOO | CUTANEOUS | 11 refills | Status: DC
Start: 2022-07-26 — End: 2023-08-07

## 2022-07-26 NOTE — Patient Instructions (Addendum)

## 2022-07-26 NOTE — Progress Notes (Signed)
Established Patient Office Visit  Subjective   Patient ID: Wesley Mckay, male    DOB: Oct 31, 1980  Age: 42 y.o. MRN: [id number]  No chief complaint on file.   Pt is a 42 yo obese male with hypothyroidism, hypogonadism, ADHD, Seizure disorder, vitamin D deficiency who presents to the clinic for follow up.    Patient discontinued rybelsus in mid April due to intolerable constipation. States he prefers to lose the weight "the old fashioned way" and has been making healthier eating choices (veggies over candy) and reduced instances of overeating. He is down to 245lbs. Patient states he is active during the day at work and gardening, but he is not actively exercising despite his foot injury being resolved.   Pt is taking testosterone regularly in form of injections. He is using his CPAP nightly. No recent seizures.  Patient is concerned with his Cialis use. States it works well, but not until the second day if he also takes his Vyvanse. States this has resulted in scheduled sexual activity and complains of reduced intimacy.  Pt has increased cigarette use back to 1 ppd. States he is disappointed in himself but that it helps him cope with the stress of 2 teenage daughters.    .. 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   Major depressive disorder, recurrent episode, moderate ([facility]) 12/19/2014   B12 deficiency 09/15/2015   Sinus bradycardia 09/15/2015   Seizure disorder ([facility]) 02/29/2016   Erectile dysfunction 02/29/2016   Vitamin D deficiency 04/11/2016   Anxiety 11/17/2016   Muscle  spasm 07/14/2017   Neck pain 07/14/2017   Attention deficit hyperactivity disorder (ADHD), combined type 07/14/2017   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   Nonspecific findings on examination of blood 09/18/2012   Sleeping difficulty 09/18/2012   Non-restorative sleep 10/30/2018   Snoring 10/30/2018   Class 1 obesity due to excess calories without serious comorbidity with body mass index (BMI) of 34.0 to 34.9 in adult 10/30/2018   Suspicious nevus 11/02/2018   Elevated blood pressure reading 11/02/2018   OSA (obstructive sleep apnea) 02/22/2019   DDD (degenerative disc disease), cervical 03/16/2020   Spinal hemangioma 03/16/2020   Chronic right shoulder pain 06/08/2020   Mass of joint of right shoulder 06/10/2020   Folate deficiency 06/24/2020   Dyslipidemia (high LDL; low HDL) 06/24/2020   Mood changes 09/21/2021   Stress fracture of left calcaneus 12/28/2021   Seborrheic dermatitis 07/26/2022   Primary hypertension 07/26/2022   Resolved Ambulatory Problems    Diagnosis Date Noted   FOOT PAIN, RIGHT 11/29/2007   Bilateral foot pain 08/16/2012   Past Medical History:  Diagnosis Date   GERD (gastroesophageal reflux disease)    History of dislocation of shoulder    Insomnia    Obesity    Seizures ([facility]) 03/2007   Thyroid disease      Review of Systems  Constitutional:  Positive for weight loss.  Respiratory:  Negative for shortness of breath.   Cardiovascular:  Negative for chest pain.  Gastrointestinal:  Negative for nausea and vomiting.  Neurological:  Negative for seizures.      Objective:  BP (!) 160/90   Pulse 88   Ht 6\' 3"  (1.905 m)   Wt 245 lb (111.1 kg)   SpO2 100%   BMI 30.62 kg/m  BP Readings from Last 3 Encounters:  07/26/22 (!) 160/90  04/07/22 128/83  02/18/22 (!) 142/91   Wt Readings from Last 3 Encounters:  07/26/22 245 lb (111.1 kg)  04/08/22 272 lb (123.4 kg)  04/07/22 270 lb (122.5 kg)      Physical Exam Constitutional:       Appearance: Normal appearance.  Cardiovascular:     Rate and Rhythm: Normal rate and regular rhythm.     Heart sounds: Normal heart sounds.  Pulmonary:     Effort: Pulmonary effort is normal.     Breath sounds: Normal breath sounds.  Skin:    Findings: No erythema, lesion or rash.  Neurological:     General: No focal deficit present.     Mental Status: He is alert and oriented to person, place, and time.  Psychiatric:        Mood and Affect: Mood normal.        Behavior: Behavior normal.        Thought Content: Thought content normal.        Judgment: Judgment normal.         Assessment & Plan:  Marland KitchenMarland KitchenDiagnoses and all orders for this visit:  Attention deficit hyperactivity disorder (ADHD), combined type -     lisdexamfetamine (VYVANSE) 60 MG capsule; Take 1 capsule (60 mg total) by mouth every morning. -     lisdexamfetamine (VYVANSE) 60 MG capsule; Take 1 capsule (60 mg total) by mouth every morning. -     lisdexamfetamine (VYVANSE) 60 MG capsule; Take 1 capsule (60 mg total) by mouth every morning.  Seborrheic dermatitis -     ketoconazole (NIZORAL) 2 % shampoo; Apply to scalp/beard twice a week for 8 weeks and then weekly thereafter  Erectile dysfunction, unspecified erectile dysfunction type -     Ambulatory referral to Urology  Primary hypertension  Hypothyroidism, unspecified type  OSA (obstructive sleep apnea)  Male hypogonadism  Class 1 obesity due to excess calories without serious comorbidity with body mass index (BMI) of 30.0 to 30.9 in adult     Patient's BP is significantly elevated. Agreed on lifestyle changes first. BP was great in feb. Make sure using CPAP nightly.   Discussed DASH diet, smoking cessation, increasing cardiovascular exercise (walking), and continuing wt loss.  Will monitor BP at home daily for at least 2 weeks  Discussed importance of smoking cessation.   Vyanse refilled for next 3 months.   Pt concerned about ED. Discussed  multiple medications that could be effecting. Does not want to change up medication at this time.   Referred to urology for further discussion regarding ED and treatment options.   Return in about 2 months (around 09/25/2022) for Follow up BP.    Tandy Gaw, PA-C

## 2022-08-30 ENCOUNTER — Encounter: Payer: Self-pay | Admitting: Physician Assistant

## 2022-08-30 DIAGNOSIS — M542 Cervicalgia: Secondary | ICD-10-CM

## 2022-08-30 DIAGNOSIS — M62838 Other muscle spasm: Secondary | ICD-10-CM

## 2022-08-30 DIAGNOSIS — F419 Anxiety disorder, unspecified: Secondary | ICD-10-CM

## 2022-08-30 MED ORDER — CYCLOBENZAPRINE HCL 10 MG PO TABS
ORAL_TABLET | ORAL | 1 refills | Status: DC
Start: 2022-08-30 — End: 2023-12-28

## 2022-08-30 MED ORDER — CLONAZEPAM 0.5 MG PO TABS
0.5000 mg | ORAL_TABLET | Freq: Every evening | ORAL | 0 refills | Status: DC | PRN
Start: 2022-08-30 — End: 2023-01-09

## 2022-08-30 NOTE — Telephone Encounter (Signed)
Rxs pended for provider.  Clonazepam  Last OV: 07/26/22 Next OV: 09/26/22 Last RF: 08/02/21

## 2022-09-26 ENCOUNTER — Ambulatory Visit: Payer: PRIVATE HEALTH INSURANCE | Admitting: Physician Assistant

## 2022-10-03 ENCOUNTER — Encounter: Payer: Self-pay | Admitting: Physician Assistant

## 2022-10-03 ENCOUNTER — Other Ambulatory Visit: Payer: Self-pay | Admitting: Physician Assistant

## 2022-10-03 DIAGNOSIS — F902 Attention-deficit hyperactivity disorder, combined type: Secondary | ICD-10-CM

## 2022-10-03 DIAGNOSIS — E291 Testicular hypofunction: Secondary | ICD-10-CM

## 2022-10-03 MED ORDER — TESTOSTERONE CYPIONATE 200 MG/ML IM SOLN
INTRAMUSCULAR | 0 refills | Status: DC
Start: 2022-10-03 — End: 2023-02-03

## 2022-10-12 ENCOUNTER — Ambulatory Visit (INDEPENDENT_AMBULATORY_CARE_PROVIDER_SITE_OTHER): Payer: PRIVATE HEALTH INSURANCE | Admitting: Physician Assistant

## 2022-10-12 VITALS — BP 138/85 | HR 75 | Ht 75.0 in | Wt 246.0 lb

## 2022-10-12 DIAGNOSIS — E291 Testicular hypofunction: Secondary | ICD-10-CM

## 2022-10-12 MED ORDER — TESTOSTERONE CYPIONATE 200 MG/ML IM SOLN
200.0000 mg | Freq: Once | INTRAMUSCULAR | Status: AC
Start: 2022-10-12 — End: 2022-10-12
  Administered 2022-10-12: 200 mg via INTRAMUSCULAR

## 2022-10-12 NOTE — Progress Notes (Signed)
Pt here for testosterone injection.   No SOB,CP or mood swings. Injection given in LUOQ. Pt  tolerated well.

## 2022-10-19 ENCOUNTER — Telehealth: Payer: Self-pay | Admitting: Physician Assistant

## 2022-10-19 NOTE — Telephone Encounter (Signed)
Patients spouse called in stating that the patient needs a PA for testosterone cypionate (DEPOTESTOSTERONE CYPIONATE) 200 MG/ML injection, please advise.

## 2022-10-21 ENCOUNTER — Telehealth: Payer: Self-pay

## 2022-10-21 NOTE — Telephone Encounter (Addendum)
Initiated Prior authorization [id number] Cypionate 200MG /ML intramuscular solution Via: Covermymeds Case/Key:[id number] Status: approved as of 10/21/22 Reason:Authorization Expiration Date: 10/21/2023 Notified Pt via: Mychart

## 2022-11-04 ENCOUNTER — Other Ambulatory Visit: Payer: Self-pay

## 2022-11-04 ENCOUNTER — Encounter: Payer: Self-pay | Admitting: Physician Assistant

## 2022-11-04 MED ORDER — NAYZILAM 5 MG/0.1ML NA SOLN: NASAL | 0 refills | Status: AC

## 2022-11-04 MED ORDER — NAYZILAM 5 MG/0.1ML NA SOLN: NASAL | 0 refills | Status: DC

## 2022-11-04 NOTE — Telephone Encounter (Signed)
Pharmacy called and reports the directions should read.   Administer one spray (5 mg dose) into one nostril. Second Dose (if needed): One additional spray (5 mg dose) into the opposite nostril may be administered after 10 minutes if the patient has not responded to the initial dose.   Pended prescription.

## 2022-11-20 ENCOUNTER — Other Ambulatory Visit: Payer: Self-pay | Admitting: Physician Assistant

## 2022-12-06 ENCOUNTER — Other Ambulatory Visit: Payer: Self-pay | Admitting: Physician Assistant

## 2022-12-06 DIAGNOSIS — F902 Attention-deficit hyperactivity disorder, combined type: Secondary | ICD-10-CM

## 2023-01-02 ENCOUNTER — Other Ambulatory Visit: Payer: Self-pay | Admitting: Physician Assistant

## 2023-01-02 DIAGNOSIS — F5101 Primary insomnia: Secondary | ICD-10-CM

## 2023-01-06 ENCOUNTER — Encounter: Payer: Self-pay | Admitting: Physician Assistant

## 2023-01-06 DIAGNOSIS — F5101 Primary insomnia: Secondary | ICD-10-CM

## 2023-01-06 MED ORDER — QUETIAPINE FUMARATE 25 MG PO TABS
25.0000 mg | ORAL_TABLET | Freq: Every day | ORAL | 0 refills | Status: DC
Start: 2023-01-06 — End: 2023-04-12

## 2023-01-07 ENCOUNTER — Other Ambulatory Visit: Payer: Self-pay | Admitting: Physician Assistant

## 2023-01-07 DIAGNOSIS — F419 Anxiety disorder, unspecified: Secondary | ICD-10-CM

## 2023-01-26 ENCOUNTER — Other Ambulatory Visit: Payer: Self-pay | Admitting: Physician Assistant

## 2023-01-26 DIAGNOSIS — F902 Attention-deficit hyperactivity disorder, combined type: Secondary | ICD-10-CM

## 2023-01-31 ENCOUNTER — Encounter: Payer: Self-pay | Admitting: Physician Assistant

## 2023-01-31 DIAGNOSIS — F902 Attention-deficit hyperactivity disorder, combined type: Secondary | ICD-10-CM

## 2023-01-31 MED ORDER — LISDEXAMFETAMINE DIMESYLATE 60 MG PO CAPS
60.0000 mg | ORAL_CAPSULE | ORAL | 0 refills | Status: DC
Start: 2023-01-31 — End: 2023-03-21

## 2023-02-01 ENCOUNTER — Other Ambulatory Visit: Payer: Self-pay | Admitting: Physician Assistant

## 2023-02-01 DIAGNOSIS — E291 Testicular hypofunction: Secondary | ICD-10-CM

## 2023-03-15 ENCOUNTER — Other Ambulatory Visit: Payer: Self-pay | Admitting: Physician Assistant

## 2023-03-15 DIAGNOSIS — F902 Attention-deficit hyperactivity disorder, combined type: Secondary | ICD-10-CM

## 2023-03-21 ENCOUNTER — Encounter: Payer: Self-pay | Admitting: Physician Assistant

## 2023-04-07 ENCOUNTER — Other Ambulatory Visit: Payer: Self-pay | Admitting: Physician Assistant

## 2023-04-07 DIAGNOSIS — F5101 Primary insomnia: Secondary | ICD-10-CM

## 2023-04-12 ENCOUNTER — Ambulatory Visit (INDEPENDENT_AMBULATORY_CARE_PROVIDER_SITE_OTHER): Payer: PRIVATE HEALTH INSURANCE | Admitting: Physician Assistant

## 2023-04-12 VITALS — BP 123/80 | HR 89 | Ht 75.0 in | Wt 262.0 lb

## 2023-04-12 DIAGNOSIS — E039 Hypothyroidism, unspecified: Secondary | ICD-10-CM

## 2023-04-12 DIAGNOSIS — Z716 Tobacco abuse counseling: Secondary | ICD-10-CM

## 2023-04-12 DIAGNOSIS — Z125 Encounter for screening for malignant neoplasm of prostate: Secondary | ICD-10-CM

## 2023-04-12 DIAGNOSIS — E785 Hyperlipidemia, unspecified: Secondary | ICD-10-CM

## 2023-04-12 DIAGNOSIS — F900 Attention-deficit hyperactivity disorder, predominantly inattentive type: Secondary | ICD-10-CM

## 2023-04-12 DIAGNOSIS — E559 Vitamin D deficiency, unspecified: Secondary | ICD-10-CM

## 2023-04-12 DIAGNOSIS — G4733 Obstructive sleep apnea (adult) (pediatric): Secondary | ICD-10-CM | POA: Diagnosis not present

## 2023-04-12 DIAGNOSIS — G40909 Epilepsy, unspecified, not intractable, without status epilepticus: Secondary | ICD-10-CM

## 2023-04-12 DIAGNOSIS — I10 Essential (primary) hypertension: Secondary | ICD-10-CM | POA: Diagnosis not present

## 2023-04-12 DIAGNOSIS — E291 Testicular hypofunction: Secondary | ICD-10-CM

## 2023-04-12 DIAGNOSIS — F902 Attention-deficit hyperactivity disorder, combined type: Secondary | ICD-10-CM

## 2023-04-12 DIAGNOSIS — F5101 Primary insomnia: Secondary | ICD-10-CM

## 2023-04-12 DIAGNOSIS — E538 Deficiency of other specified B group vitamins: Secondary | ICD-10-CM

## 2023-04-12 MED ORDER — LISDEXAMFETAMINE DIMESYLATE 60 MG PO CAPS: 60.0000 mg | ORAL_CAPSULE | ORAL | 0 refills | Status: DC

## 2023-04-12 MED ORDER — QUETIAPINE FUMARATE 100 MG PO TABS
100.0000 mg | ORAL_TABLET | Freq: Every day | ORAL | 1 refills | Status: DC
Start: 2023-04-12 — End: 2023-09-25

## 2023-04-12 NOTE — Progress Notes (Signed)
 Established Patient Office Visit  Subjective   Patient ID: Wesley Mckay, male    DOB: 1980-02-27  Age: 43 y.o. MRN: [id number]  Chief Complaint  Patient presents with   Medication Refill    Quetiapine    CC: Medication Refill  HPI Patient is a 43 yo male with ADHD, OSA, hypothyroidism, and hypogonadism who presents for medication refill.   He is currently taking Vyvanse 60mg  daily. He states he has not been taking the Vyvanse the past couple of night due to running out of his sleep medication. He would like to increase his sleep medication. It works well but does not keep him asleep all night.   He feels congested today. She is not using nasal sprays or OTC meds.   .. 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   Major depressive disorder, recurrent episode, moderate ([facility]) 12/19/2014   B12 deficiency 09/15/2015   Sinus bradycardia 09/15/2015   Seizure disorder ([facility]) 02/29/2016   Erectile dysfunction 02/29/2016   Vitamin D deficiency 04/11/2016   Anxiety 11/17/2016   Muscle spasm 07/14/2017   Neck pain 07/14/2017   Attention deficit hyperactivity disorder (ADHD), combined type 07/14/2017   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   Nonspecific findings on examination of blood 09/18/2012   Sleeping difficulty 09/18/2012   Non-restorative sleep 10/30/2018   Snoring 10/30/2018   Class 1 obesity due to excess calories without serious comorbidity with body mass index (BMI) of 34.0 to 34.9 in adult 10/30/2018   Suspicious nevus 11/02/2018   Elevated blood pressure reading  11/02/2018   OSA (obstructive sleep apnea) 02/22/2019   DDD (degenerative disc disease), cervical 03/16/2020   Spinal hemangioma 03/16/2020   Chronic right shoulder pain 06/08/2020   Mass of joint of right shoulder 06/10/2020   Folate deficiency 06/24/2020   Dyslipidemia (high LDL; low HDL) 06/24/2020   Mood changes 09/21/2021   Stress fracture of left calcaneus 12/28/2021   Seborrheic dermatitis 07/26/2022   Primary hypertension 07/26/2022   Resolved Ambulatory Problems    Diagnosis Date Noted   FOOT PAIN, RIGHT 11/29/2007   Bilateral foot pain 08/16/2012   Past Medical History:  Diagnosis Date   GERD (gastroesophageal reflux disease)    History of dislocation of shoulder    Insomnia    Obesity    Seizures ([facility]) 03/2007   Thyroid disease     Review of Systems  All other systems reviewed and are negative.    Objective:     BP 123/80 (BP Location: Left Arm, Patient Position: Sitting, Cuff Size: Large)   Pulse 89   Ht 6\' 3"  (1.905 m)   Wt 262 lb (118.8 kg)   SpO2 97%   BMI 32.75 kg/m  BP Readings from Last 3 Encounters:  04/12/23 123/80  10/12/22 138/85  07/26/22 (!) 160/90   Wt Readings from Last 3 Encounters:  04/12/23 262 lb (118.8 kg)  10/12/22 246 lb (111.6 kg)  07/26/22 245 lb (111.1 kg)   SpO2 Readings from Last 3 Encounters:  04/12/23 97%  10/12/22 97%  07/26/22 100%      Physical Exam Constitutional:  Appearance: Normal appearance.  HENT:     Head: Normocephalic and atraumatic.  Eyes:     Extraocular Movements: Extraocular movements intact.  Cardiovascular:     Rate and Rhythm: Normal rate and regular rhythm.     Pulses: Normal pulses.     Heart sounds: Normal heart sounds.  Pulmonary:     Effort: Pulmonary effort is normal.     Breath sounds: Normal breath sounds.  Musculoskeletal:        General: Normal range of motion.  Skin:    General: Skin is warm.  Neurological:     Mental Status: He is alert.    Last CBC Lab Results   Component Value Date   WBC 9.5 04/12/2023   HGB 17.7 04/12/2023   HCT 51.6 (H) 04/12/2023   MCV 91 04/12/2023   MCH 31.2 04/12/2023   RDW 13.2 04/12/2023   PLT 265 04/12/2023   Last metabolic panel Lab Results  Component Value Date   GLUCOSE 88 04/12/2023   NA 136 04/12/2023   K 4.6 04/12/2023   CL 99 04/12/2023   CO2 22 04/12/2023   BUN 10 04/12/2023   CREATININE 1.21 04/12/2023   EGFR 76 04/12/2023   CALCIUM 9.3 04/12/2023   PROT 7.2 04/12/2023   ALBUMIN 4.8 04/12/2023   LABGLOB 2.4 04/12/2023   BILITOT 0.5 04/12/2023   ALKPHOS 117 04/12/2023   AST 17 04/12/2023   ALT 10 04/12/2023   ANIONGAP 13 12/09/2019   Last lipids Lab Results  Component Value Date   CHOL 197 04/12/2023   HDL 36 (L) 04/12/2023   LDLCALC 144 (H) 04/12/2023   TRIG 94 04/12/2023   CHOLHDL 5.5 (H) 04/12/2023   Last thyroid functions Lab Results  Component Value Date   TSH 5.540 (H) 04/12/2023   Last vitamin D Lab Results  Component Value Date   VD25OH 14.3 (L) 04/12/2023    Assessment & Plan:  Marland KitchenMarland KitchenIsaiah was seen today for medication refill.  Diagnoses and all orders for this visit:  Attention deficit hyperactivity disorder (ADHD), combined type -     lisdexamfetamine (VYVANSE) 60 MG capsule; Take 1 capsule (60 mg total) by mouth every morning. -     lisdexamfetamine (VYVANSE) 60 MG capsule; Take 1 capsule (60 mg total) by mouth every morning. -     lisdexamfetamine (VYVANSE) 60 MG capsule; Take 1 capsule (60 mg total) by mouth every morning.  Seizure disorder (HCC) -     CMP14+EGFR  OSA (obstructive sleep apnea)  Hypothyroidism, unspecified type -     CMP14+EGFR -     TSH + free T4  Primary hypertension -     CMP14+EGFR  Male hypogonadism -     Testosterone -     CBC w/Diff/Platelet  Primary insomnia -     QUEtiapine (SEROQUEL) 100 MG tablet; Take 1 tablet (100 mg total) by mouth at bedtime.  Hyperlipidemia LDL goal <130 -     Lipid panel -     CMP14+EGFR  B12  deficiency -     B12 and Folate Panel  Attention deficit hyperactivity disorder (ADHD), predominantly inattentive type  Vitamin D deficiency -     VITAMIN D 25 Hydroxy (Vit-D Deficiency, Fractures)  Prostate cancer screening -     PSA  Encounter for smoking cessation counseling    - Vyvanse 60mg  refilled - Seroquel increased to 100mg   - Ordered fasting labs today  - Discussed smoking cessation  - Patient set quit date March 6   -  Currently smokes 1 ppd - Prostate/HgB/Hematocrit tested today for testosterone refill.  -discussed symptomatic care of congestion and to let up know if not improving   Tandy Gaw, PA-C

## 2023-04-14 ENCOUNTER — Encounter: Payer: Self-pay | Admitting: Physician Assistant

## 2023-04-17 ENCOUNTER — Encounter: Payer: Self-pay | Admitting: Physician Assistant

## 2023-04-17 NOTE — Progress Notes (Signed)
 Wesley Mckay,   Vitamin D is very low. You need to be on at least 2000 units daily. Recheck in 3 months.   Have you been taking your thyroid medication regularly?   Testosterone looks great.   B12 looks great.   Cholesterol not to goal. Your 10 year risk is still under 7.5 percent so you can continue with low fat diet and regular exercise. Recheck in 1 year.   [name].The 10-year ASCVD risk score (Arnett DK, et al., 2019) is: 2.5%   Values used to calculate the score:     Age: 43 years     Sex: Male     Is Non-Hispanic African American: No     Diabetic: No     Tobacco smoker: No     Systolic Blood Pressure: 123 mmHg     Is BP treated: No     HDL Cholesterol: 36 mg/dL     Total Cholesterol: 197 mg/dL

## 2023-04-18 LAB — CBC WITH DIFFERENTIAL/PLATELET
Basophils Absolute: 0.1 10*3/uL (ref 0.0–0.2)
Basos: 1 %
EOS (ABSOLUTE): 0.1 10*3/uL (ref 0.0–0.4)
Eos: 1 %
Hematocrit: 51.6 % — ABNORMAL HIGH (ref 37.5–51.0)
Hemoglobin: 17.7 g/dL (ref 13.0–17.7)
Immature Grans (Abs): 0 10*3/uL (ref 0.0–0.1)
Immature Granulocytes: 0 %
Lymphocytes Absolute: 2.2 10*3/uL (ref 0.7–3.1)
Lymphs: 23 %
MCH: 31.2 pg (ref 26.6–33.0)
MCHC: 34.3 g/dL (ref 31.5–35.7)
MCV: 91 fL (ref 79–97)
Monocytes Absolute: 0.6 10*3/uL (ref 0.1–0.9)
Monocytes: 6 %
Neutrophils Absolute: 6.5 10*3/uL (ref 1.4–7.0)
Neutrophils: 69 %
Platelets: 265 10*3/uL (ref 150–450)
RBC: 5.67 x10E6/uL (ref 4.14–5.80)
RDW: 13.2 % (ref 11.6–15.4)
WBC: 9.5 10*3/uL (ref 3.4–10.8)

## 2023-04-18 LAB — LIPID PANEL
Chol/HDL Ratio: 5.5 ratio — ABNORMAL HIGH (ref 0.0–5.0)
Cholesterol, Total: 197 mg/dL (ref 100–199)
HDL: 36 mg/dL — ABNORMAL LOW (ref >39)
LDL Chol Calc (NIH): 144 mg/dL — ABNORMAL HIGH (ref 0–99)
Triglycerides: 94 mg/dL (ref 0–149)
VLDL Cholesterol Cal: 17 mg/dL (ref 5–40)

## 2023-04-18 LAB — CMP14+EGFR
ALT: 10 IU/L (ref 0–44)
AST: 17 IU/L (ref 0–40)
Albumin: 4.8 g/dL (ref 4.1–5.1)
Alkaline Phosphatase: 117 IU/L (ref 44–121)
BUN/Creatinine Ratio: 8 — ABNORMAL LOW (ref 9–20)
BUN: 10 mg/dL (ref 6–24)
Bilirubin Total: 0.5 mg/dL (ref 0.0–1.2)
CO2: 22 mmol/L (ref 20–29)
Calcium: 9.3 mg/dL (ref 8.7–10.2)
Chloride: 99 mmol/L (ref 96–106)
Creatinine, Ser: 1.21 mg/dL (ref 0.76–1.27)
Globulin, Total: 2.4 g/dL (ref 1.5–4.5)
Glucose: 88 mg/dL (ref 70–99)
Potassium: 4.6 mmol/L (ref 3.5–5.2)
Sodium: 136 mmol/L (ref 134–144)
Total Protein: 7.2 g/dL (ref 6.0–8.5)
eGFR: 76 mL/min/{1.73_m2} (ref >59)

## 2023-04-18 LAB — TSH+FREE T4
Free T4: 1.82 ng/dL — ABNORMAL HIGH (ref 0.82–1.77)
TSH: 5.54 u[IU]/mL — ABNORMAL HIGH (ref 0.450–4.500)

## 2023-04-18 LAB — B12 AND FOLATE PANEL
Folate: <2 ng/mL — ABNORMAL LOW (ref >3.0)
Vitamin B-12: 799 pg/mL (ref 232–1245)

## 2023-04-18 LAB — VITAMIN D 25 HYDROXY (VIT D DEFICIENCY, FRACTURES): Vit D, 25-Hydroxy: 14.3 ng/mL — ABNORMAL LOW (ref 30.0–100.0)

## 2023-04-18 LAB — PSA: Prostate Specific Ag, Serum: 0.7 ng/mL (ref 0.0–4.0)

## 2023-04-18 LAB — TESTOSTERONE: Testosterone: 533 ng/dL (ref 264–916)

## 2023-04-18 NOTE — Progress Notes (Signed)
 Your folate is low. Add folate OTC.

## 2023-05-05 ENCOUNTER — Encounter: Payer: Self-pay | Admitting: Sports Medicine

## 2023-05-05 ENCOUNTER — Ambulatory Visit: Payer: PRIVATE HEALTH INSURANCE

## 2023-05-05 ENCOUNTER — Ambulatory Visit (INDEPENDENT_AMBULATORY_CARE_PROVIDER_SITE_OTHER): Payer: PRIVATE HEALTH INSURANCE | Admitting: Sports Medicine

## 2023-05-05 DIAGNOSIS — M79671 Pain in right foot: Secondary | ICD-10-CM

## 2023-05-05 DIAGNOSIS — M79672 Pain in left foot: Secondary | ICD-10-CM | POA: Diagnosis not present

## 2023-05-05 NOTE — Progress Notes (Signed)
    Procedures performed today:    None.  Independent interpretation of notes and tests performed by another provider:   None.  Brief History, Exam, Impression, and Recommendations:    Bilateral foot pain, history of left calcaneal stress fracture Chronic bilateral foot pain, we treated him approximately a year ago, he did well after cast immobilization, he had a calcaneal stress injury on MRI. Now having recurrence of pain, this time right foot, lateral calcaneus for On exam he has pes planus bilaterally, tenderness at the anterior process of the calcaneus at its articulation with the cuboid. His orthotics are too thick so we remove the blue EVA, I added some gel cups, we will do x-rays of the right foot which is hurting the most, return to see me in about 2 weeks and we can consider an injection of the calcaneocuboid joint.    ____________________________________________ Wesley Mckay. Wesley Mckay, M.D., ABFM., CAQSM., AME. Primary Care and Sports Medicine [facility]  Adjunct Professor of Family Medicine  [place] [employer] [facility] [employer] of [employer]  Restaurant manager, fast food

## 2023-05-05 NOTE — Assessment & Plan Note (Signed)
 Chronic bilateral foot pain, we treated him approximately a year ago, he did well after cast immobilization, he had a calcaneal stress injury on MRI. Now having recurrence of pain, this time right foot, lateral calcaneus for On exam he has pes planus bilaterally, tenderness at the anterior process of the calcaneus at its articulation with the cuboid. His orthotics are too thick so we remove the blue EVA, I added some gel cups, we will do x-rays of the right foot which is hurting the most, return to see me in about 2 weeks and we can consider an injection of the calcaneocuboid joint.

## 2023-05-05 NOTE — Patient Instructions (Signed)
 Look into getting [employer] hiking boots at [facility]

## 2023-05-25 ENCOUNTER — Other Ambulatory Visit: Payer: Self-pay | Admitting: Physician Assistant

## 2023-05-25 DIAGNOSIS — E039 Hypothyroidism, unspecified: Secondary | ICD-10-CM

## 2023-05-25 DIAGNOSIS — E291 Testicular hypofunction: Secondary | ICD-10-CM

## 2023-05-26 ENCOUNTER — Encounter: Payer: Self-pay | Admitting: Sports Medicine

## 2023-05-26 ENCOUNTER — Ambulatory Visit (INDEPENDENT_AMBULATORY_CARE_PROVIDER_SITE_OTHER): Payer: PRIVATE HEALTH INSURANCE | Admitting: Sports Medicine

## 2023-05-26 DIAGNOSIS — M79672 Pain in left foot: Secondary | ICD-10-CM | POA: Diagnosis not present

## 2023-05-26 DIAGNOSIS — M79671 Pain in right foot: Secondary | ICD-10-CM | POA: Diagnosis not present

## 2023-05-26 NOTE — Assessment & Plan Note (Signed)
 Wesley Mckay returns, he is a pleasant 43 year old male, we have treated him longitudinally for bilateral foot pain, about a year ago he had a left calcaneal stress fracture, he did well after cast immobilization. At the last visit he was having recurrence of pain, right foot, lateral aspect. He had pes planus bilaterally, tenderness at the anterior process of the calcaneus. His orthotics were too thick so we did remove some of the blue EVA and added silicone gel cups. He also has what sounds to be some ankle braces which have been helpful as well, he will continue these. X-rays were unrevealing. Today he is doing well, still has a bit of discomfort, no longer at the anterior process of the calcaneus but somewhat over the fourth TMT I think this migratory pain is just more related to his underlying mild degenerative processes and overload, though he is doing better so we can keep the follow-up on an as-needed basis for now.

## 2023-05-26 NOTE — Progress Notes (Signed)
    Procedures performed today:    None.  Independent interpretation of notes and tests performed by another provider:   None.  Brief History, Exam, Impression, and Recommendations:    Bilateral foot pain, history of left calcaneal stress fracture Kieren returns, he is a pleasant 43 year old male, we have treated him longitudinally for bilateral foot pain, about a year ago he had a left calcaneal stress fracture, he did well after cast immobilization. At the last visit he was having recurrence of pain, right foot, lateral aspect. He had pes planus bilaterally, tenderness at the anterior process of the calcaneus. His orthotics were too thick so we did remove some of the blue EVA and added silicone gel cups. He also has what sounds to be some ankle braces which have been helpful as well, he will continue these. X-rays were unrevealing. Today he is doing well, still has a bit of discomfort, no longer at the anterior process of the calcaneus but somewhat over the fourth TMT I think this migratory pain is just more related to his underlying mild degenerative processes and overload, though he is doing better so we can keep the follow-up on an as-needed basis for now.    ____________________________________________ Ihor Austin. Benjamin Stain, M.D., ABFM., CAQSM., AME. Primary Care and Sports Medicine [facility]  Adjunct Professor of Family Medicine  [employer] [facility] [employer]  Restaurant manager, fast food

## 2023-07-05 ENCOUNTER — Other Ambulatory Visit: Payer: Self-pay | Admitting: Physician Assistant

## 2023-07-05 DIAGNOSIS — K219 Gastro-esophageal reflux disease without esophagitis: Secondary | ICD-10-CM

## 2023-07-12 ENCOUNTER — Ambulatory Visit: Payer: PRIVATE HEALTH INSURANCE | Admitting: Physician Assistant

## 2023-07-14 ENCOUNTER — Ambulatory Visit (INDEPENDENT_AMBULATORY_CARE_PROVIDER_SITE_OTHER): Payer: PRIVATE HEALTH INSURANCE | Admitting: Physician Assistant

## 2023-07-14 ENCOUNTER — Encounter: Payer: Self-pay | Admitting: Physician Assistant

## 2023-07-14 VITALS — BP 138/78 | HR 110 | Ht 75.0 in | Wt 254.0 lb

## 2023-07-14 DIAGNOSIS — E039 Hypothyroidism, unspecified: Secondary | ICD-10-CM

## 2023-07-14 DIAGNOSIS — M79671 Pain in right foot: Secondary | ICD-10-CM

## 2023-07-14 DIAGNOSIS — Z021 Encounter for pre-employment examination: Secondary | ICD-10-CM | POA: Insufficient documentation

## 2023-07-14 DIAGNOSIS — I10 Essential (primary) hypertension: Secondary | ICD-10-CM | POA: Diagnosis not present

## 2023-07-14 DIAGNOSIS — Z1211 Encounter for screening for malignant neoplasm of colon: Secondary | ICD-10-CM

## 2023-07-14 DIAGNOSIS — E291 Testicular hypofunction: Secondary | ICD-10-CM

## 2023-07-14 DIAGNOSIS — F902 Attention-deficit hyperactivity disorder, combined type: Secondary | ICD-10-CM

## 2023-07-14 MED ORDER — LISDEXAMFETAMINE DIMESYLATE 60 MG PO CAPS: 60.0000 mg | ORAL_CAPSULE | ORAL | 0 refills | Status: DC

## 2023-07-14 NOTE — Progress Notes (Signed)
 Established Patient Office Visit  Subjective   Patient ID: Wesley Mckay, male    DOB: May 24, 1980  Age: 43 y.o. MRN: [id number]  Chief Complaint  Patient presents with   Medical Management of Chronic Issues    Med refill drug test for employment    HPI Pt is a 43 yo obese male with hypogonadism, ADHD, hypothyroidism, anxiety who presents to the clinic for medication management.   Overall he is doing ok. He is trying to find a job and needs pre-employment drug testing.   He is doing well with energy and mood. He is doing well with focus. He does need refill of vyanse.    ROS See HPI.    Objective:     BP 138/78   Pulse (!) 110   Ht 6\' 3"  (1.905 m)   Wt 254 lb (115.2 kg)   SpO2 99%   BMI 31.75 kg/m  BP Readings from Last 3 Encounters:  07/14/23 138/78  04/12/23 123/80  10/12/22 138/85   Wt Readings from Last 3 Encounters:  07/14/23 254 lb (115.2 kg)  04/12/23 262 lb (118.8 kg)  10/12/22 246 lb (111.6 kg)  ..    07/17/2023    6:41 AM 04/12/2023    1:10 PM 07/26/2022    9:27 AM 04/07/2022   10:29 AM 09/21/2021   10:53 AM  Depression screen PHQ 2/9  Decreased Interest 0 1 1 0 2  Down, Depressed, Hopeless 1 1 1  0 2  PHQ - 2 Score 1 2 2  0 4  Altered sleeping 1  3  2   Tired, decreased energy 0  1  2  Change in appetite 0  0  1  Feeling bad or failure about yourself  1  1  2   Trouble concentrating 0  0  0  Moving slowly or fidgety/restless 0  0  0  Suicidal thoughts 0  0  0  PHQ-9 Score 3  7  11   Difficult doing work/chores Not difficult at all  Somewhat difficult  Very difficult   ..    07/17/2023    6:41 AM 07/26/2022    9:28 AM 09/21/2021   10:53 AM 02/23/2021   11:21 AM  GAD 7 : Generalized Anxiety Score  Nervous, Anxious, on Edge 1 1 2  0  Control/stop worrying 0 0 1 0  Worry too much - different things 0 1 2 1   Trouble relaxing 1 0 3 1  Restless 0 0 0 0  Easily annoyed or irritable 1 2 2  0  Afraid - awful might happen 0 0 0 0  Total GAD 7 Score 3  4 10 2   Anxiety Difficulty Not difficult at all Somewhat difficult Very difficult Not difficult at all        Physical Exam Constitutional:      Appearance: Normal appearance. He is obese.  HENT:     Head: Normocephalic.  Cardiovascular:     Rate and Rhythm: Normal rate and regular rhythm.  Pulmonary:     Effort: Pulmonary effort is normal.     Breath sounds: Normal breath sounds.  Musculoskeletal:     Right lower leg: No edema.     Left lower leg: No edema.  Neurological:     General: No focal deficit present.     Mental Status: He is alert and oriented to person, place, and time.  Psychiatric:        Mood and Affect: Mood normal.  The 10-year ASCVD risk score (Arnett DK, et al., 2019) is: 3.4%    Assessment & Plan:  Wesley Mckay was seen today for medical management of chronic issues.  Diagnoses and all orders for this visit:  Attention deficit hyperactivity disorder (ADHD), combined type -     lisdexamfetamine (VYVANSE ) 60 MG capsule; Take 1 capsule (60 mg total) by mouth every morning. -     lisdexamfetamine (VYVANSE ) 60 MG capsule; Take 1 capsule (60 mg total) by mouth every morning. -     lisdexamfetamine (VYVANSE ) 60 MG capsule; Take 1 capsule (60 mg total) by mouth every morning.  Primary hypertension  Male hypogonadism  Hypothyroidism, unspecified type -     TSH + free T4  Colon cancer screening -     Cologuard   PHQ/GAD numbers look good Vyvanse  refilled 2nd BP check better Testosterone  labs UTD Need TSH checked Needs cologuard for colon cancer screening Needs pre-employment drug screening go downstairs to health at work Follow up in 3months   Return in about 3 months (around 10/14/2023).    Alicen Donalson, PA-C

## 2023-07-15 LAB — TSH+FREE T4
Free T4: 1.1 ng/dL (ref 0.82–1.77)
TSH: 8.11 u[IU]/mL — ABNORMAL HIGH (ref 0.450–4.500)

## 2023-07-17 ENCOUNTER — Encounter: Payer: Self-pay | Admitting: Physician Assistant

## 2023-07-17 ENCOUNTER — Ambulatory Visit: Payer: Self-pay | Admitting: Physician Assistant

## 2023-07-17 NOTE — Progress Notes (Signed)
 Confirm taking 300mcg in the morning on empty stomach of synthroid ?

## 2023-07-18 NOTE — Progress Notes (Signed)
 Free T4 looks good. Same dose for now. Recheck in 3 months.

## 2023-08-04 ENCOUNTER — Other Ambulatory Visit: Payer: Self-pay | Admitting: Physician Assistant

## 2023-08-04 DIAGNOSIS — L219 Seborrheic dermatitis, unspecified: Secondary | ICD-10-CM

## 2023-08-08 LAB — COLOGUARD: COLOGUARD: NEGATIVE

## 2023-08-08 NOTE — Progress Notes (Signed)
 Negative cologuard and negative screening for colon cancer. Re-screen in 3 years.

## 2023-08-10 NOTE — Progress Notes (Signed)
 Ok to send letter

## 2023-08-29 ENCOUNTER — Other Ambulatory Visit: Payer: Self-pay | Admitting: Physician Assistant

## 2023-08-29 DIAGNOSIS — E538 Deficiency of other specified B group vitamins: Secondary | ICD-10-CM

## 2023-09-13 ENCOUNTER — Other Ambulatory Visit: Payer: Self-pay | Admitting: Physician Assistant

## 2023-09-13 DIAGNOSIS — E291 Testicular hypofunction: Secondary | ICD-10-CM

## 2023-09-20 ENCOUNTER — Other Ambulatory Visit: Payer: Self-pay | Admitting: Physician Assistant

## 2023-09-20 DIAGNOSIS — F5101 Primary insomnia: Secondary | ICD-10-CM

## 2023-10-13 ENCOUNTER — Ambulatory Visit (INDEPENDENT_AMBULATORY_CARE_PROVIDER_SITE_OTHER): Payer: PRIVATE HEALTH INSURANCE | Admitting: Physician Assistant

## 2023-10-13 ENCOUNTER — Encounter: Payer: Self-pay | Admitting: Physician Assistant

## 2023-10-13 VITALS — BP 153/83 | HR 74 | Ht 75.0 in | Wt 243.0 lb

## 2023-10-13 DIAGNOSIS — I10 Essential (primary) hypertension: Secondary | ICD-10-CM

## 2023-10-13 DIAGNOSIS — F5101 Primary insomnia: Secondary | ICD-10-CM | POA: Diagnosis not present

## 2023-10-13 DIAGNOSIS — G40909 Epilepsy, unspecified, not intractable, without status epilepticus: Secondary | ICD-10-CM

## 2023-10-13 DIAGNOSIS — F902 Attention-deficit hyperactivity disorder, combined type: Secondary | ICD-10-CM

## 2023-10-13 DIAGNOSIS — E291 Testicular hypofunction: Secondary | ICD-10-CM

## 2023-10-13 DIAGNOSIS — E039 Hypothyroidism, unspecified: Secondary | ICD-10-CM

## 2023-10-13 DIAGNOSIS — E538 Deficiency of other specified B group vitamins: Secondary | ICD-10-CM

## 2023-10-13 DIAGNOSIS — E559 Vitamin D deficiency, unspecified: Secondary | ICD-10-CM

## 2023-10-13 NOTE — Progress Notes (Signed)
 Established Patient Office Visit  Subjective   Patient ID: Wesley Mckay, male    DOB: 1981-01-05  Age: 43 y.o. MRN: [id number]   HPI Pt is a 43 yo male with ADHD, Hypothyroidism, Hypogonadism, Insomnia, Seizures, OSA, ED who presents to the clinic for follow up and medication refills.   He is doing well. He has lost 20lbs at new job and being more active. He denies any concerns with mood or medication. He does his own testosterone  shots at home. He has not had any seizures in years. He is sleeping well on seroquel . He is not using his CPAP machine. He does not want to use it either. He is compliant with medications. No concerns today.       Review of Systems  All other systems reviewed and are negative.     Objective:     BP (!) 153/83   Pulse 74   Ht 6' 3 (1.905 m)   Wt 243 lb (110.2 kg)   SpO2 99%   BMI 30.37 kg/m  BP Readings from Last 3 Encounters:  10/13/23 (!) 153/83  07/14/23 138/78  04/12/23 123/80   Wt Readings from Last 3 Encounters:  10/13/23 243 lb (110.2 kg)  07/14/23 254 lb (115.2 kg)  04/12/23 262 lb (118.8 kg)     ..    10/13/2023    2:15 PM 07/17/2023    6:41 AM 04/12/2023    1:10 PM 07/26/2022    9:27 AM 04/07/2022   10:29 AM  Depression screen PHQ 2/9  Decreased Interest 0 0 1 1 0  Down, Depressed, Hopeless 1 1 1 1  0  PHQ - 2 Score 1 1 2 2  0  Altered sleeping 2 1  3    Tired, decreased energy 0 0  1   Change in appetite 0 0  0   Feeling bad or failure about yourself  0 1  1   Trouble concentrating 0 0  0   Moving slowly or fidgety/restless 0 0  0   Suicidal thoughts 0 0  0   PHQ-9 Score 3 3  7    Difficult doing work/chores Not difficult at all Not difficult at all  Somewhat difficult    ..    10/13/2023    2:16 PM 07/17/2023    6:41 AM 07/26/2022    9:28 AM 09/21/2021   10:53 AM  GAD 7 : Generalized Anxiety Score  Nervous, Anxious, on Edge 0 1 1 2   Control/stop worrying 0 0 0 1  Worry too much - different things 0 0 1 2   Trouble relaxing 2 1 0 3  Restless 0 0 0 0  Easily annoyed or irritable 0 1 2 2   Afraid - awful might happen 0 0 0 0  Total GAD 7 Score 2 3 4 10   Anxiety Difficulty Not difficult at all Not difficult at all Somewhat difficult Very difficult     Physical Exam Constitutional:      Appearance: Normal appearance. He is obese.  HENT:     Head: Normocephalic.  Cardiovascular:     Rate and Rhythm: Normal rate and regular rhythm.     Pulses: Normal pulses.  Pulmonary:     Effort: Pulmonary effort is normal.     Breath sounds: Normal breath sounds.  Musculoskeletal:        General: Normal range of motion.     Right lower leg: No edema.     Left lower leg: No edema.  Neurological:     General: No focal deficit present.     Mental Status: He is alert and oriented to person, place, and time.  Psychiatric:        Mood and Affect: Mood normal.       The 10-year ASCVD risk score (Arnett DK, et al., 2019) is: 4%    Assessment & Plan:  SABRASABRADiagnoses and all orders for this visit:  Male hypogonadism -     Testosterone  -     CMP14+EGFR  Primary insomnia  B12 deficiency -     B12 and Folate Panel  Folate deficiency -     B12 and Folate Panel  Primary hypertension  Hypothyroidism, unspecified type -     TSH + free T4  Vitamin D  deficiency -     VITAMIN D  25 Hydroxy (Vit-D Deficiency, Fractures)   BP is not to goal Need to start keeping a log at home and bring it in to discuss Lower salt intake in diet Labs ordered to follow up   PHQ/GAD to goal Mood is good  ADHD Refilled vyvanse   Hypothyroidism Refilled synthroid    Testosterone  Ordered labs  Continue on testosterone  and will adjust accordingly  Discussed importance of treating OSA with CPAP Pt does not want to work on getting a new machine and starting to use it He has lost weight and thinks that has made OSA better   Return in about 6 months (around 04/13/2024).    Leevon Upperman, PA-C

## 2023-10-14 LAB — CMP14+EGFR
ALT: 16 IU/L (ref 0–44)
AST: 16 IU/L (ref 0–40)
Albumin: 4.6 g/dL (ref 4.1–5.1)
Alkaline Phosphatase: 132 IU/L — ABNORMAL HIGH (ref 44–121)
BUN/Creatinine Ratio: 12 (ref 9–20)
BUN: 12 mg/dL (ref 6–24)
Bilirubin Total: 0.3 mg/dL (ref 0.0–1.2)
CO2: 19 mmol/L — ABNORMAL LOW (ref 20–29)
Calcium: 10 mg/dL (ref 8.7–10.2)
Chloride: 103 mmol/L (ref 96–106)
Creatinine, Ser: 1 mg/dL (ref 0.76–1.27)
Globulin, Total: 2.6 g/dL (ref 1.5–4.5)
Glucose: 98 mg/dL (ref 70–99)
Potassium: 4.6 mmol/L (ref 3.5–5.2)
Sodium: 142 mmol/L (ref 134–144)
Total Protein: 7.2 g/dL (ref 6.0–8.5)
eGFR: 95 mL/min/1.73 (ref >59)

## 2023-10-14 LAB — B12 AND FOLATE PANEL
Folate: 19 ng/mL (ref >3.0)
Vitamin B-12: 817 pg/mL (ref 232–1245)

## 2023-10-14 LAB — TSH+FREE T4
Free T4: 1.8 ng/dL — ABNORMAL HIGH (ref 0.82–1.77)
TSH: 0.07 u[IU]/mL — ABNORMAL LOW (ref 0.450–4.500)

## 2023-10-14 LAB — TESTOSTERONE: Testosterone: 949 ng/dL — ABNORMAL HIGH (ref 264–916)

## 2023-10-14 LAB — VITAMIN D 25 HYDROXY (VIT D DEFICIENCY, FRACTURES): Vit D, 25-Hydroxy: 42.2 ng/mL (ref 30.0–100.0)

## 2023-10-17 ENCOUNTER — Ambulatory Visit: Payer: Self-pay | Admitting: Physician Assistant

## 2023-10-17 ENCOUNTER — Encounter: Payer: Self-pay | Admitting: Sports Medicine

## 2023-10-17 ENCOUNTER — Encounter: Payer: Self-pay | Admitting: Physician Assistant

## 2023-10-17 MED ORDER — LISDEXAMFETAMINE DIMESYLATE 60 MG PO CAPS: 60.0000 mg | ORAL_CAPSULE | ORAL | 0 refills | Status: DC

## 2023-10-17 MED ORDER — LAMOTRIGINE 100 MG PO TABS: 100.0000 mg | ORAL_TABLET | Freq: Two times a day (BID) | ORAL | 1 refills | Status: AC

## 2023-10-17 MED ORDER — LEVOTHYROXINE SODIUM 300 MCG PO TABS
300.0000 ug | ORAL_TABLET | Freq: Every day | ORAL | 1 refills | Status: DC
Start: 2023-10-17 — End: 2023-10-17

## 2023-10-17 MED ORDER — LEVOTHYROXINE SODIUM 137 MCG PO TABS: ORAL_TABLET | ORAL | 0 refills | Status: DC

## 2023-10-17 NOTE — Patient Instructions (Signed)
 Hypertension, Adult Hypertension is another name for high blood pressure. High blood pressure forces your heart to work harder to pump blood. This can cause problems over time. There are two numbers in a blood pressure reading. There is a top number (systolic) over a bottom number (diastolic). It is best to have a blood pressure that is below 120/80. What are the causes? The cause of this condition is not known. Some other conditions can lead to high blood pressure. What increases the risk? Some lifestyle factors can make you more likely to develop high blood pressure: Smoking. Not getting enough exercise or physical activity. Being overweight. Having too much fat, sugar, calories, or salt (sodium) in your diet. Drinking too much alcohol. Other risk factors include: Having any of these conditions: Heart disease. Diabetes. High cholesterol. Kidney disease. Obstructive sleep apnea. Having a family history of high blood pressure and high cholesterol. Age. The risk increases with age. Stress. What are the signs or symptoms? High blood pressure may not cause symptoms. Very high blood pressure (hypertensive crisis) may cause: Headache. Fast or uneven heartbeats (palpitations). Shortness of breath. Nosebleed. Vomiting or feeling like you may vomit (nauseous). Changes in how you see. Very bad chest pain. Feeling dizzy. Seizures. How is this treated? This condition is treated by making healthy lifestyle changes, such as: Eating healthy foods. Exercising more. Drinking less alcohol. Your doctor may prescribe medicine if lifestyle changes do not help enough and if: Your top number is above 130. Your bottom number is above 80. Your personal target blood pressure may vary. Follow these instructions at home: Eating and drinking  If told, follow the DASH eating plan. To follow this plan: Fill one half of your plate at each meal with fruits and vegetables. Fill one fourth of your plate  at each meal with whole grains. Whole grains include whole-wheat pasta, brown rice, and whole-grain bread. Eat or drink low-fat dairy products, such as skim milk or low-fat yogurt. Fill one fourth of your plate at each meal with low-fat (lean) proteins. Low-fat proteins include fish, chicken without skin, eggs, beans, and tofu. Avoid fatty meat, cured and processed meat, or chicken with skin. Avoid pre-made or processed food. Limit the amount of salt in your diet to less than 1,500 mg each day. Do not drink alcohol if: Your doctor tells you not to drink. You are pregnant, may be pregnant, or are planning to become pregnant. If you drink alcohol: Limit how much you have to: 0-1 drink a day for women. 0-2 drinks a day for men. Know how much alcohol is in your drink. In the [place] one drink equals one 12 oz bottle of beer (355 mL), one 5 oz glass of wine (148 mL), or one 1 oz glass of hard liquor (44 mL). Lifestyle  Work with your doctor to stay at a healthy weight or to lose weight. Ask your doctor what the best weight is for you. Get at least 30 minutes of exercise that causes your heart to beat faster (aerobic exercise) most days of the week. This may include walking, swimming, or biking. Get at least 30 minutes of exercise that strengthens your muscles (resistance exercise) at least 3 days a week. This may include lifting weights or doing Pilates. Do not smoke or use any products that contain nicotine or tobacco. If you need help quitting, ask your doctor. Check your blood pressure at home as told by your doctor. Keep all follow-up visits. Medicines Take over-the-counter and prescription medicines  only as told by your doctor. Follow directions carefully. Do not skip doses of blood pressure medicine. The medicine does not work as well if you skip doses. Skipping doses also puts you at risk for problems. Ask your doctor about side effects or reactions to medicines that you should watch  for. Contact a doctor if: You think you are having a reaction to the medicine you are taking. You have headaches that keep coming back. You feel dizzy. You have swelling in your ankles. You have trouble with your vision. Get help right away if: You get a very bad headache. You start to feel mixed up (confused). You feel weak or numb. You feel faint. You have very bad pain in your: Chest. Belly (abdomen). You vomit more than once. You have trouble breathing. These symptoms may be an emergency. Get help right away. Call 911. Do not wait to see if the symptoms will go away. Do not drive yourself to the hospital. Summary Hypertension is another name for high blood pressure. High blood pressure forces your heart to work harder to pump blood. For most people, a normal blood pressure is less than 120/80. Making healthy choices can help lower blood pressure. If your blood pressure does not get lower with healthy choices, you may need to take medicine. This information is not intended to replace advice given to you by your health care provider. Make sure you discuss any questions you have with your health care provider. Document Revised: 11/19/2020 Document Reviewed: 11/19/2020 Elsevier Patient Education  2024 [employer].

## 2023-10-17 NOTE — Progress Notes (Signed)
 Wesley Mckay,   Your testosterone  is a little elevated.  Are you taking 200mg  every 7 days? I would decrease to 175mg  every 7 days.  Vitamin D  looks better. Make sure taking with dairy for better absorption.  B12 looks GREAT.  Folate back into normal range.  We need to decrease your thyroid dose. Confirm what dose you are taking?

## 2023-11-13 ENCOUNTER — Encounter: Payer: Self-pay | Admitting: Physician Assistant

## 2023-11-13 ENCOUNTER — Ambulatory Visit: Payer: PRIVATE HEALTH INSURANCE | Admitting: Physician Assistant

## 2023-11-13 VITALS — BP 140/95 | HR 90 | Ht 75.0 in | Wt 243.0 lb

## 2023-11-13 DIAGNOSIS — I10 Essential (primary) hypertension: Secondary | ICD-10-CM | POA: Diagnosis not present

## 2023-11-13 DIAGNOSIS — Z23 Encounter for immunization: Secondary | ICD-10-CM

## 2023-11-13 NOTE — Progress Notes (Signed)
   Established Patient Office Visit  Subjective   Patient ID: Wesley Mckay, male    DOB: 11/16/1980  Age: 43 y.o. MRN: [id number]  Chief Complaint  Patient presents with   Medical Management of Chronic Issues    HPI Patient is a 43 year old male presenting today for 1 month follow up due to high blood pressure readings from his last visit. He has been keeping a BP log for the last month. He takes it every morning before drinking coffee or taking his Vyvanse . He states he often forgets to take it at night. He denies any symptoms of hypertension such as CP, headaches, or vision changes. BP ranges between 120-130s over 80s.   He does admit to drinking 1/2 a pot to 2/3 of a pot of coffee each day before noon. He is unwilling to cut back on his caffeine intake.   ROS See HPI.    Objective:     BP (!) 140/95 (BP Location: Right Arm, Patient Position: Sitting, Cuff Size: Normal)   Pulse 90   Ht 6' 3 (1.905 m)   Wt 243 lb (110.2 kg)   SpO2 99%   BMI 30.37 kg/m  BP Readings from Last 3 Encounters:  11/13/23 (!) 140/95  10/13/23 (!) 153/83  07/14/23 138/78   Wt Readings from Last 3 Encounters:  11/13/23 243 lb (110.2 kg)  10/13/23 243 lb (110.2 kg)  07/14/23 254 lb (115.2 kg)      Physical Exam Constitutional:      Appearance: Normal appearance. He is obese.  HENT:     Head: Normocephalic.  Cardiovascular:     Rate and Rhythm: Normal rate and regular rhythm.  Pulmonary:     Effort: Pulmonary effort is normal.     Breath sounds: Normal breath sounds.  Neurological:     General: No focal deficit present.     Mental Status: He is alert and oriented to person, place, and time.  Psychiatric:        Mood and Affect: Mood normal.      The 10-year ASCVD risk score (Arnett DK, et al., 2019) is: 3.5%    Assessment & Plan:  SABRASABRAStepehn was seen today for medical management of chronic issues.  Diagnoses and all orders for this visit:  Primary  hypertension  Immunization due -     Flu vaccine trivalent PF, 6mos and older(Flulaval,Afluria,Fluarix,Fluzone)   HOME BP readings much better than office Keep monitoring at home Pt is asymptomatic Discussed importance of lowering caffiene intake and sodium intake Flu shot given today Follow up as needed if symptoms persist or worsen   Return in about 3 months (around 02/12/2024).    Rubina Basinski, PA-C

## 2023-12-20 ENCOUNTER — Other Ambulatory Visit: Payer: Self-pay | Admitting: Physician Assistant

## 2023-12-20 DIAGNOSIS — F419 Anxiety disorder, unspecified: Secondary | ICD-10-CM

## 2023-12-20 DIAGNOSIS — M62838 Other muscle spasm: Secondary | ICD-10-CM

## 2023-12-20 DIAGNOSIS — M542 Cervicalgia: Secondary | ICD-10-CM

## 2023-12-27 ENCOUNTER — Telehealth: Payer: Self-pay | Admitting: Pharmacy Technician

## 2023-12-27 ENCOUNTER — Other Ambulatory Visit (HOSPITAL_COMMUNITY): Payer: Self-pay

## 2023-12-27 NOTE — Telephone Encounter (Signed)
 Pharmacy [facility] Encounter  Received notification from [facility] that Prior Authorization for Testosterone  Cypionate 200MG /ML intramuscular solution has been APPROVED from 12/27/2023 to 12/26/2024. Ran test claim, Copay is $20.00. This test claim was processed through [facility]- copay amounts may vary at other pharmacies due to pharmacy/plan contracts, or as the patient moves through the different stages of their insurance plan.   PA #/Case ID/Reference #: [id number]

## 2023-12-27 NOTE — Telephone Encounter (Signed)
 Pharmacy Patient [facility] Encounter   Received notification from [facility] that prior authorization for Testosterone  Cypionate 200MG /ML intramuscular solution is required/requested.   Insurance verification completed.   The patient is insured through [employer].   Per test claim: PA required; PA submitted to above mentioned insurance via Latent Key/confirmation #/EOC [id number] Status is pending

## 2023-12-28 ENCOUNTER — Other Ambulatory Visit (HOSPITAL_COMMUNITY): Payer: Self-pay

## 2023-12-30 ENCOUNTER — Other Ambulatory Visit: Payer: Self-pay | Admitting: Physician Assistant

## 2023-12-30 DIAGNOSIS — K219 Gastro-esophageal reflux disease without esophagitis: Secondary | ICD-10-CM

## 2023-12-30 DIAGNOSIS — F902 Attention-deficit hyperactivity disorder, combined type: Secondary | ICD-10-CM

## 2024-01-01 NOTE — Telephone Encounter (Signed)
 LISDEXAMFETAMINE Last OV: 11/13/23 Next OV: 04/12/24 Last RF: 12/16/23

## 2024-01-19 ENCOUNTER — Other Ambulatory Visit: Payer: Self-pay | Admitting: Physician Assistant

## 2024-02-12 ENCOUNTER — Ambulatory Visit: Payer: PRIVATE HEALTH INSURANCE | Admitting: Physician Assistant

## 2024-02-26 ENCOUNTER — Encounter: Payer: Self-pay | Admitting: Physician Assistant

## 2024-02-26 DIAGNOSIS — F902 Attention-deficit hyperactivity disorder, combined type: Secondary | ICD-10-CM

## 2024-02-26 DIAGNOSIS — K219 Gastro-esophageal reflux disease without esophagitis: Secondary | ICD-10-CM

## 2024-02-27 MED ORDER — LEVOTHYROXINE SODIUM 137 MCG PO TABS: ORAL_TABLET | ORAL | 0 refills | Status: DC

## 2024-02-27 MED ORDER — LISDEXAMFETAMINE DIMESYLATE 60 MG PO CAPS: 60.0000 mg | ORAL_CAPSULE | ORAL | 0 refills | Status: DC

## 2024-03-06 ENCOUNTER — Ambulatory Visit: Payer: Self-pay | Admitting: Physician Assistant

## 2024-03-06 ENCOUNTER — Encounter: Payer: Self-pay | Admitting: Physician Assistant

## 2024-03-06 VITALS — BP 134/81 | HR 98 | Ht 75.0 in | Wt 253.0 lb

## 2024-03-06 DIAGNOSIS — K219 Gastro-esophageal reflux disease without esophagitis: Secondary | ICD-10-CM

## 2024-03-06 DIAGNOSIS — F902 Attention-deficit hyperactivity disorder, combined type: Secondary | ICD-10-CM

## 2024-03-06 DIAGNOSIS — E039 Hypothyroidism, unspecified: Secondary | ICD-10-CM

## 2024-03-06 DIAGNOSIS — R1013 Epigastric pain: Secondary | ICD-10-CM

## 2024-03-06 DIAGNOSIS — R1011 Right upper quadrant pain: Secondary | ICD-10-CM

## 2024-03-06 DIAGNOSIS — Z1211 Encounter for screening for malignant neoplasm of colon: Secondary | ICD-10-CM

## 2024-03-06 MED ORDER — FAMOTIDINE 40 MG PO TABS: 40.0000 mg | ORAL_TABLET | Freq: Every day | ORAL | 0 refills | Status: AC

## 2024-03-06 NOTE — Patient Instructions (Signed)
 Continue omeprazole  40mg  twice a day and add pepcid  40mg  at bedtime.   Inflammation of the Stomach in Adults (Gastritis): What to Know Gastritis is when the lining of your stomach gets red and swollen (inflammation). There're two kinds of gastritis. There's gastritis that happens quickly. This is called acute gastritis. There's gastritis that happens over a long time. This is called chronic gastritis. Gastritis must be treated. If not, you can have sores or bleeding in your stomach. What are the causes? Germs that get to your stomach and cause an infection. Drinking too much alcohol. Taking some medicines. Too much acid in the stomach. Disease of the stomach. Allergies. Cancer treatments (radiation). Smoking cigarettes or using products that contain nicotine  or tobacco. What increases the risk? Having a disease of the intestines. Having an autoimmune disease, such as Crohn's disease. This is when your immune system attacks other organs in the body. Using aspirin, ibuprofen, and other NSAIDs to treat other conditions. Stress. What are the signs or symptoms? Pain or burning in your belly. Feeling like you may throw up. Throwing up. Feeling too full after you eat. Losing weight. Bad breath. Blood in your throw-up or poop. In some cases, there are no symptoms. How is this treated? Gastritis is treated with medicines. The medicines that are used depend on what caused the condition. If the condition was caused by germs (bacteria), you'll be given antibiotics. If the condition was caused by too much acid in the stomach, you'll be given antacids, H2 blockers, or proton pump inhibitors. You may also be told to stop taking certain medicines, such as aspirin, ibuprofen, and other NSAIDs. Follow these instructions at home: Medicines Take your medicines only as told. Take your antibiotics as told. Do not stop taking them even if you start to feel better. Alcohol use Do not drink alcohol  if: Your doctor tells you not to drink. You are pregnant, may be pregnant, or are planning to become pregnant. If you drink alcohol: Limit how much you have to: 0-1 drink a day if you're male. 0-2 drinks a day if you're male. Know how much alcohol is in your drink. In the [place] one drink is one 12 oz bottle of beer (355 mL), one 5 oz glass of wine (148 mL), or one 1 oz glass of hard liquor (44 mL). General instructions Eat small meals often, instead of large meals. Avoid foods and drinks that make your symptoms worse. Drink more fluid as told. Do not smoke, vape, or use nicotine  or tobacco. Talk with your doctor about ways to manage stress. You may be told to: Get regular exercise. Do deep breathing. Do meditation or yoga. Contact a doctor if: Your symptoms get worse. The pain in your belly gets worse. Your symptoms go away and then come back. You have a fever. Get help right away if: You throw up blood or a substance that looks like coffee grounds. You have black or dark red poop. You throw up every time you drink something. These symptoms may be an emergency. Call 911 right away. Do not wait to see if the symptoms will go away. Do not drive yourself to the hospital. This information is not intended to replace advice given to you by your health care provider. Make sure you discuss any questions you have with your health care provider. Document Revised: 04/04/2023 Document Reviewed: 04/04/2023 Elsevier Patient Education  2025 [employer].

## 2024-03-06 NOTE — Progress Notes (Unsigned)
 "  Acute Office Visit  Subjective:     Patient ID: Wesley Mckay, male    DOB: 1981-01-22, 44 y.o.   MRN: [id number]  HPI Discussed the use of AI scribe software for clinical note transcription with the patient, who gave verbal consent to proceed.  History of Present Illness Wesley Mckay is a 44 year old male who presents with recurrent abdominal pain and heartburn.  Abdominal pain - Recurrent pain located primarily under the rib cage - Episodes occurred earlier this week and last week - Each episode lasts approximately a quarter of a day - Pain is alleviated by drinking apple juice  Heartburn and indigestion - Chronic heartburn and indigestion - No daily heartburn, but frequent episodes - Taking omeprazole  40mg  twice a day - Uses apple cider vinegar for symptom relief - No alcohol consumption - High intake of coffee and soda  Gastrointestinal symptoms - Sticky stools from time to time - Unable to recall stool color - No black stools - No recent changes in bowel habits - No significant weight loss  Medication use - Current medications: Prilosec twice daily, Synthroid , Vyvanse  - No use of anti-inflammatory medications such as ibuprofen - Significant reduction in anti-inflammatory use since dental work    ROS See HPI.     Objective:    BP 134/81   Pulse 98   Ht 6' 3 (1.905 m)   Wt 253 lb (114.8 kg)   SpO2 99%   BMI 31.62 kg/m  BP Readings from Last 3 Encounters:  03/06/24 134/81  11/13/23 (!) 140/95  10/13/23 (!) 153/83   Wt Readings from Last 3 Encounters:  03/06/24 253 lb (114.8 kg)  11/13/23 243 lb (110.2 kg)  10/13/23 243 lb (110.2 kg)      Physical Exam Constitutional:      Appearance: Normal appearance. He is obese.  HENT:     Head: Normocephalic.  Cardiovascular:     Rate and Rhythm: Normal rate and regular rhythm.  Pulmonary:     Effort: Pulmonary effort is normal.     Breath sounds: Normal breath sounds.  Abdominal:      General: Bowel sounds are normal. There is no distension.     Palpations: Abdomen is soft. There is no mass.     Tenderness: There is no abdominal tenderness. There is no right CVA tenderness, left CVA tenderness, guarding or rebound.     Hernia: No hernia is present.     Comments: Mild soreness to palpation over epigastric and right upper quadrant to palpation.   Neurological:     Mental Status: He is alert.          Assessment & Plan:  Wesley Mckay was seen today for abdominal pain.  Diagnoses and all orders for this visit:  Epigastric pain -     US  Abdomen Complete; Future -     CMP14+EGFR -     Lipase -     CBC w/Diff/Platelet -     TSH + free T4 -     famotidine  (PEPCID ) 40 MG tablet; Take 1 tablet (40 mg total) by mouth at bedtime. -     Ambulatory referral to Gastroenterology  Acquired hypothyroidism -     TSH + free T4 -     levothyroxine  (SYNTHROID ) 137 MCG tablet; Take two tablets daily in the morning before breakfast.  Gastroesophageal reflux disease without esophagitis -     US  Abdomen Complete; Future -     CMP14+EGFR -  Lipase -     CBC w/Diff/Platelet -     TSH + free T4 -     famotidine  (PEPCID ) 40 MG tablet; Take 1 tablet (40 mg total) by mouth at bedtime. -     Ambulatory referral to Gastroenterology  Right upper quadrant pain -     US  Abdomen Complete; Future -     CMP14+EGFR -     Lipase -     CBC w/Diff/Platelet -     TSH + free T4 -     famotidine  (PEPCID ) 40 MG tablet; Take 1 tablet (40 mg total) by mouth at bedtime. -     Ambulatory referral to Gastroenterology  Colon cancer screening -     Ambulatory referral to Gastroenterology  Attention deficit hyperactivity disorder (ADHD), combined type -     lisdexamfetamine  (VYVANSE ) 60 MG capsule; Take 1 capsule (60 mg total) by mouth every morning. -     lisdexamfetamine  (VYVANSE ) 60 MG capsule; Take 1 capsule (60 mg total) by mouth every morning. -     lisdexamfetamine  (VYVANSE ) 60 MG capsule;  Take 1 capsule (60 mg total) by mouth every morning.   Assessment & Plan Suspected gastritis due to chronic GERD symptoms Intermittent epigastric pain likely due to gastritis. High coffee and soda intake noted. Endoscopy is definitive for gastritis, but treatment initiated to assess response. - continue omeprazole  twice a day.  - Ordered ultrasound to evaluate gallbladder, stomach, and pancreas. - Checked pancreatic enzymes and hemoglobin levels. - Added famotidine  40 mg at bedtime. - referral to GI for colonoscopy and possible endoscopy.   Acquired hypothyroidism Currently taking Synthroid . Timing of medication intake is important for efficacy. - Continue Synthroid  as prescribed. - TSH ordered today and will adjust medications as needed.   ADHD - refilled vyvanse  for 3 months   Return in about 3 months (around 06/04/2024).  [name], PA-C   "

## 2024-03-07 LAB — CMP14+EGFR
ALT: 37 IU/L (ref 0–44)
AST: 22 IU/L (ref 0–40)
Albumin: 4.6 g/dL (ref 4.1–5.1)
Alkaline Phosphatase: 160 IU/L — ABNORMAL HIGH (ref 47–123)
BUN/Creatinine Ratio: 10 (ref 9–20)
BUN: 10 mg/dL (ref 6–24)
Bilirubin Total: 0.4 mg/dL (ref 0.0–1.2)
CO2: 21 mmol/L (ref 20–29)
Calcium: 9.6 mg/dL (ref 8.7–10.2)
Chloride: 99 mmol/L (ref 96–106)
Creatinine, Ser: 1.04 mg/dL (ref 0.76–1.27)
Globulin, Total: 2.3 g/dL (ref 1.5–4.5)
Glucose: 60 mg/dL — ABNORMAL LOW (ref 70–99)
Potassium: 4.1 mmol/L (ref 3.5–5.2)
Sodium: 138 mmol/L (ref 134–144)
Total Protein: 6.9 g/dL (ref 6.0–8.5)
eGFR: 90 mL/min/1.73

## 2024-03-07 LAB — CBC WITH DIFFERENTIAL/PLATELET
Basophils Absolute: 0.1 x10E3/uL (ref 0.0–0.2)
Basos: 1 %
EOS (ABSOLUTE): 0.1 x10E3/uL (ref 0.0–0.4)
Eos: 1 %
Hematocrit: 46.2 % (ref 37.5–51.0)
Hemoglobin: 15.7 g/dL (ref 13.0–17.7)
Immature Grans (Abs): 0 x10E3/uL (ref 0.0–0.1)
Immature Granulocytes: 0 %
Lymphocytes Absolute: 2.2 x10E3/uL (ref 0.7–3.1)
Lymphs: 24 %
MCH: 29.3 pg (ref 26.6–33.0)
MCHC: 34 g/dL (ref 31.5–35.7)
MCV: 86 fL (ref 79–97)
Monocytes Absolute: 0.5 x10E3/uL (ref 0.1–0.9)
Monocytes: 6 %
Neutrophils Absolute: 6.2 x10E3/uL (ref 1.4–7.0)
Neutrophils: 68 %
Platelets: 296 x10E3/uL (ref 150–450)
RBC: 5.36 x10E6/uL (ref 4.14–5.80)
RDW: 13.1 % (ref 11.6–15.4)
WBC: 9.1 x10E3/uL (ref 3.4–10.8)

## 2024-03-07 LAB — TSH+FREE T4
Free T4: 1.77 ng/dL (ref 0.82–1.77)
TSH: 0.051 u[IU]/mL — ABNORMAL LOW (ref 0.450–4.500)

## 2024-03-07 LAB — LIPASE: Lipase: 28 U/L (ref 13–78)

## 2024-03-08 ENCOUNTER — Ambulatory Visit: Payer: Self-pay | Admitting: Physician Assistant

## 2024-03-08 ENCOUNTER — Encounter: Payer: Self-pay | Admitting: Physician Assistant

## 2024-03-08 DIAGNOSIS — K8020 Calculus of gallbladder without cholecystitis without obstruction: Secondary | ICD-10-CM

## 2024-03-08 DIAGNOSIS — R161 Splenomegaly, not elsewhere classified: Secondary | ICD-10-CM

## 2024-03-08 DIAGNOSIS — K760 Fatty (change of) liver, not elsewhere classified: Secondary | ICD-10-CM

## 2024-03-08 MED ORDER — LISDEXAMFETAMINE DIMESYLATE 60 MG PO CAPS: 60.0000 mg | ORAL_CAPSULE | ORAL | 0 refills | Status: AC

## 2024-03-08 MED ORDER — LEVOTHYROXINE SODIUM 137 MCG PO TABS: ORAL_TABLET | ORAL | 1 refills | Status: AC

## 2024-03-08 NOTE — Progress Notes (Signed)
 Labs look good. TSH borderline but free levels look good.

## 2024-03-12 ENCOUNTER — Other Ambulatory Visit: Payer: PRIVATE HEALTH INSURANCE

## 2024-03-15 MED ORDER — OMEPRAZOLE 40 MG PO CPDR: 40.0000 mg | DELAYED_RELEASE_CAPSULE | Freq: Two times a day (BID) | ORAL | 3 refills | Status: AC

## 2024-03-15 NOTE — Addendum Note (Signed)
 Addended by: BONNY JON DEL on: 03/15/2024 08:19 AM   Modules accepted: Orders

## 2024-03-20 ENCOUNTER — Other Ambulatory Visit: Payer: PRIVATE HEALTH INSURANCE

## 2024-03-20 DIAGNOSIS — R1011 Right upper quadrant pain: Secondary | ICD-10-CM

## 2024-03-20 DIAGNOSIS — K219 Gastro-esophageal reflux disease without esophagitis: Secondary | ICD-10-CM

## 2024-03-20 DIAGNOSIS — R1013 Epigastric pain: Secondary | ICD-10-CM

## 2024-03-20 NOTE — Progress Notes (Signed)
 Kriss,   You have fatty liver. Work on weight loss and avoiding alcohol with regular exercise are the best treatments. Do you drink any alcohol?   Spleen enlarged some.   You do have gallstones but no signs of inflammation or obstruction. Some of your symptoms in the past could have been caused by temporary obstruction of stones. We can refer you to general surgery to consider removal. Or we could see if you continue to have GI problems. Thoughts?

## 2024-03-21 NOTE — Telephone Encounter (Signed)
 Pended referral to General surgery

## 2024-04-12 ENCOUNTER — Ambulatory Visit: Payer: PRIVATE HEALTH INSURANCE | Admitting: Physician Assistant
